# Patient Record
Sex: Female | Born: 1980 | Race: White | Hispanic: No | Smoking: Never smoker
Health system: Southern US, Community
[De-identification: ages and names within clinical notes are randomized; demographics above are authoritative.]

## PROBLEM LIST (undated history)

## (undated) ENCOUNTER — Inpatient Hospital Stay (HOSPITAL_COMMUNITY): Payer: Self-pay

## (undated) DIAGNOSIS — F32A Depression, unspecified: Secondary | ICD-10-CM

## (undated) DIAGNOSIS — R87629 Unspecified abnormal cytological findings in specimens from vagina: Secondary | ICD-10-CM

## (undated) DIAGNOSIS — Z87898 Personal history of other specified conditions: Secondary | ICD-10-CM

## (undated) DIAGNOSIS — O3481 Maternal care for other abnormalities of pelvic organs, first trimester: Secondary | ICD-10-CM

## (undated) DIAGNOSIS — F329 Major depressive disorder, single episode, unspecified: Secondary | ICD-10-CM

## (undated) DIAGNOSIS — N83209 Unspecified ovarian cyst, unspecified side: Secondary | ICD-10-CM

## (undated) DIAGNOSIS — F419 Anxiety disorder, unspecified: Secondary | ICD-10-CM

## (undated) DIAGNOSIS — O2693 Pregnancy related conditions, unspecified, third trimester: Secondary | ICD-10-CM

## (undated) HISTORY — DX: Pregnancy related conditions, unspecified, third trimester: O26.93

## (undated) HISTORY — DX: Maternal care for other abnormalities of pelvic organs, first trimester: O34.81

## (undated) HISTORY — DX: Unspecified ovarian cyst, unspecified side: N83.209

## (undated) HISTORY — DX: Personal history of other specified conditions: Z87.898

## (undated) HISTORY — DX: Depression, unspecified: F32.A

## (undated) HISTORY — PX: WISDOM TOOTH EXTRACTION: SHX21

## (undated) HISTORY — DX: Major depressive disorder, single episode, unspecified: F32.9

## (undated) HISTORY — DX: Anxiety disorder, unspecified: F41.9

## (undated) HISTORY — PX: OTHER SURGICAL HISTORY: SHX169

---

## 2008-10-30 DIAGNOSIS — O2693 Pregnancy related conditions, unspecified, third trimester: Secondary | ICD-10-CM

## 2008-10-30 HISTORY — DX: Pregnancy related conditions, unspecified, third trimester: O26.93

## 2010-12-02 DIAGNOSIS — F40218 Other animal type phobia: Secondary | ICD-10-CM | POA: Insufficient documentation

## 2011-03-14 LAB — RPR: RPR: NONREACTIVE

## 2011-03-14 LAB — ABO/RH: RH Type: POSITIVE

## 2011-04-26 ENCOUNTER — Ambulatory Visit (HOSPITAL_COMMUNITY): Payer: Self-pay | Admitting: Psychology

## 2011-05-02 ENCOUNTER — Ambulatory Visit (INDEPENDENT_AMBULATORY_CARE_PROVIDER_SITE_OTHER): Payer: 59 | Admitting: Psychology

## 2011-05-02 DIAGNOSIS — F331 Major depressive disorder, recurrent, moderate: Secondary | ICD-10-CM

## 2011-05-05 ENCOUNTER — Other Ambulatory Visit: Payer: Self-pay | Admitting: Obstetrics & Gynecology

## 2011-05-05 ENCOUNTER — Encounter (INDEPENDENT_AMBULATORY_CARE_PROVIDER_SITE_OTHER): Payer: BC Managed Care – PPO

## 2011-05-05 DIAGNOSIS — Z3689 Encounter for other specified antenatal screening: Secondary | ICD-10-CM

## 2011-05-05 DIAGNOSIS — Z3480 Encounter for supervision of other normal pregnancy, unspecified trimester: Secondary | ICD-10-CM

## 2011-05-08 ENCOUNTER — Encounter (INDEPENDENT_AMBULATORY_CARE_PROVIDER_SITE_OTHER): Payer: 59 | Admitting: Psychology

## 2011-05-08 ENCOUNTER — Ambulatory Visit (HOSPITAL_COMMUNITY)
Admission: RE | Admit: 2011-05-08 | Discharge: 2011-05-08 | Disposition: A | Discharge status: Home / Self Care | Payer: BC Managed Care – PPO | Source: Ambulatory Visit | Attending: Obstetrics & Gynecology | Admitting: Obstetrics & Gynecology

## 2011-05-08 DIAGNOSIS — O358XX0 Maternal care for other (suspected) fetal abnormality and damage, not applicable or unspecified: Secondary | ICD-10-CM | POA: Insufficient documentation

## 2011-05-08 DIAGNOSIS — F4323 Adjustment disorder with mixed anxiety and depressed mood: Secondary | ICD-10-CM

## 2011-05-08 DIAGNOSIS — Z1389 Encounter for screening for other disorder: Secondary | ICD-10-CM | POA: Insufficient documentation

## 2011-05-08 DIAGNOSIS — Z3689 Encounter for other specified antenatal screening: Secondary | ICD-10-CM

## 2011-05-08 DIAGNOSIS — Z363 Encounter for antenatal screening for malformations: Secondary | ICD-10-CM | POA: Insufficient documentation

## 2011-05-15 ENCOUNTER — Encounter (INDEPENDENT_AMBULATORY_CARE_PROVIDER_SITE_OTHER): Payer: 59 | Admitting: Psychology

## 2011-05-15 DIAGNOSIS — F411 Generalized anxiety disorder: Secondary | ICD-10-CM

## 2011-05-22 ENCOUNTER — Encounter (INDEPENDENT_AMBULATORY_CARE_PROVIDER_SITE_OTHER): Payer: 59 | Admitting: Psychology

## 2011-05-22 ENCOUNTER — Encounter: Payer: Self-pay | Admitting: *Deleted

## 2011-05-22 DIAGNOSIS — F411 Generalized anxiety disorder: Secondary | ICD-10-CM

## 2011-05-29 ENCOUNTER — Encounter (INDEPENDENT_AMBULATORY_CARE_PROVIDER_SITE_OTHER): Payer: BC Managed Care – PPO

## 2011-05-29 DIAGNOSIS — Z3480 Encounter for supervision of other normal pregnancy, unspecified trimester: Secondary | ICD-10-CM

## 2011-06-08 ENCOUNTER — Encounter (HOSPITAL_COMMUNITY): Payer: BC Managed Care – PPO | Admitting: Psychology

## 2011-06-19 ENCOUNTER — Encounter (HOSPITAL_COMMUNITY): Payer: BC Managed Care – PPO | Admitting: Psychology

## 2011-06-26 ENCOUNTER — Other Ambulatory Visit: Payer: Self-pay | Admitting: Physician Assistant

## 2011-06-26 ENCOUNTER — Other Ambulatory Visit: Payer: Self-pay | Admitting: Obstetrics & Gynecology

## 2011-06-26 ENCOUNTER — Encounter (INDEPENDENT_AMBULATORY_CARE_PROVIDER_SITE_OTHER): Payer: BC Managed Care – PPO | Admitting: Physician Assistant

## 2011-06-26 DIAGNOSIS — Z3480 Encounter for supervision of other normal pregnancy, unspecified trimester: Secondary | ICD-10-CM

## 2011-06-26 DIAGNOSIS — IMO0002 Reserved for concepts with insufficient information to code with codable children: Secondary | ICD-10-CM

## 2011-06-26 LAB — CBC
HCT: 34 % — AB (ref 36–46)
HCT: 34 % — AB (ref 36–46)
Hemoglobin: 10.6 g/dL — AB (ref 12.0–16.0)
Platelets: 170 K/µL (ref 150–399)

## 2011-06-26 LAB — HIV ANTIBODY (ROUTINE TESTING W REFLEX): HIV: NONREACTIVE

## 2011-06-26 LAB — SYPHILIS: RPR W/REFLEX TO RPR TITER AND TREPONEMAL ANTIBODIES, TRADITIONAL SCREENING AND DIAGNOSIS ALGORITHM: RPR: NONREACTIVE

## 2011-06-27 ENCOUNTER — Encounter: Payer: Self-pay | Admitting: *Deleted

## 2011-06-27 LAB — HIV ANTIBODY (ROUTINE TESTING W REFLEX): HIV: NONREACTIVE

## 2011-06-27 LAB — CBC
MCH: 28.9 pg (ref 26.0–34.0)
MCHC: 31.3 g/dL (ref 30.0–36.0)
Platelets: 170 10*3/uL (ref 150–400)
RBC: 3.67 MIL/uL — ABNORMAL LOW (ref 3.87–5.11)

## 2011-07-04 ENCOUNTER — Ambulatory Visit (HOSPITAL_COMMUNITY)
Admission: RE | Admit: 2011-07-04 | Discharge: 2011-07-04 | Disposition: A | Discharge status: Home / Self Care | Payer: BC Managed Care – PPO | Source: Ambulatory Visit | Attending: Obstetrics & Gynecology | Admitting: Obstetrics & Gynecology

## 2011-07-04 DIAGNOSIS — IMO0002 Reserved for concepts with insufficient information to code with codable children: Secondary | ICD-10-CM

## 2011-07-04 DIAGNOSIS — Z3689 Encounter for other specified antenatal screening: Secondary | ICD-10-CM | POA: Insufficient documentation

## 2011-07-10 ENCOUNTER — Encounter (HOSPITAL_COMMUNITY): Payer: BC Managed Care – PPO | Admitting: Psychology

## 2011-07-13 ENCOUNTER — Ambulatory Visit: Payer: BC Managed Care – PPO | Admitting: Physician Assistant

## 2011-07-31 ENCOUNTER — Encounter (INDEPENDENT_AMBULATORY_CARE_PROVIDER_SITE_OTHER): Payer: BC Managed Care – PPO | Admitting: Psychology

## 2011-07-31 DIAGNOSIS — F411 Generalized anxiety disorder: Secondary | ICD-10-CM

## 2011-08-04 ENCOUNTER — Encounter: Payer: Self-pay | Admitting: Family

## 2011-08-04 ENCOUNTER — Ambulatory Visit (INDEPENDENT_AMBULATORY_CARE_PROVIDER_SITE_OTHER): Payer: BC Managed Care – PPO | Admitting: Family

## 2011-08-04 VITALS — BP 96/60 | Temp 98.5°F | Wt 157.0 lb

## 2011-08-04 DIAGNOSIS — Z3480 Encounter for supervision of other normal pregnancy, unspecified trimester: Secondary | ICD-10-CM

## 2011-08-04 DIAGNOSIS — IMO0002 Reserved for concepts with insufficient information to code with codable children: Secondary | ICD-10-CM

## 2011-08-04 NOTE — Progress Notes (Signed)
p-89 

## 2011-08-04 NOTE — Progress Notes (Signed)
Reviewed philosophy of episiotomies (not unless medically indicated>fetal distress); no problems or concerns; schedule growth [place] due to marginal cord insertion.

## 2011-08-08 ENCOUNTER — Ambulatory Visit (HOSPITAL_COMMUNITY)
Admission: RE | Admit: 2011-08-08 | Discharge: 2011-08-08 | Disposition: A | Discharge status: Home / Self Care | Payer: BC Managed Care – PPO | Source: Ambulatory Visit | Attending: Obstetrics & Gynecology | Admitting: Obstetrics & Gynecology

## 2011-08-08 DIAGNOSIS — Z3689 Encounter for other specified antenatal screening: Secondary | ICD-10-CM | POA: Insufficient documentation

## 2011-08-08 DIAGNOSIS — IMO0002 Reserved for concepts with insufficient information to code with codable children: Secondary | ICD-10-CM

## 2011-08-14 DIAGNOSIS — O368390 Maternal care for abnormalities of the fetal heart rate or rhythm, unspecified trimester, not applicable or unspecified: Secondary | ICD-10-CM

## 2011-08-18 ENCOUNTER — Ambulatory Visit (INDEPENDENT_AMBULATORY_CARE_PROVIDER_SITE_OTHER): Payer: BC Managed Care – PPO | Admitting: Advanced Practice Midwife

## 2011-08-18 DIAGNOSIS — Z3480 Encounter for supervision of other normal pregnancy, unspecified trimester: Secondary | ICD-10-CM

## 2011-08-18 NOTE — Progress Notes (Signed)
p=96 

## 2011-08-18 NOTE — Patient Instructions (Signed)
Pregnancy - Third Trimester The third trimester of pregnancy (the last 3 months) is a period of the most rapid growth for you and your baby. The baby approaches a length of 20 inches and a weight of 6 to 10 pounds. The baby is adding on fat and getting ready for life outside your body. While inside, babies have periods of sleeping and waking, suck their thumbs, and hiccups. You can often feel small contractions of the uterus. This is false labor. It is also called Braxton-Hicks contractions. This is like a practice for labor. The usual problems in this stage of pregnancy include more difficulty breathing, swelling of the hands and feet from water retention, and having to urinate more often because of the uterus and baby pressing on your bladder.  PRENATAL EXAMS  Blood work may continue to be done during prenatal exams. These tests are done to check on your health and the probable health of your baby. Blood work is used to follow your blood levels (hemoglobin). Anemia (low hemoglobin) is common during pregnancy. Iron and vitamins are given to help prevent this. You may also continue to be checked for diabetes. Some of the past blood tests may be done again.   The size of the uterus is measured during each visit. This makes sure your baby is growing properly according to your pregnancy dates.   Your blood pressure is checked every prenatal visit. This is to make sure you are not getting toxemia.   Your urine is checked every prenatal visit for infection, diabetes and protein.   Your weight is checked at each visit. This is done to make sure gains are happening at the suggested rate and that you and your baby are growing normally.   Sometimes, an ultrasound is performed to confirm the position and the proper growth and development of the baby. This is a test done that bounces harmless sound waves off the baby so your caregiver can more accurately determine due dates.   Discuss the type of pain  medication and anesthesia you will have during your labor and delivery.   Discuss the possibility and anesthesia if a Cesarean Section might be necessary.   Inform your caregiver if there is any mental or physical violence at home.  Sometimes, a specialized non-stress test, contraction stress test and biophysical profile are done to make sure the baby is not having a problem. Checking the amniotic fluid surrounding the baby is called an amniocentesis. The amniotic fluid is removed by sticking a needle into the belly (abdomen). This is sometimes done near the end of pregnancy if an early delivery is required. In this case, it is done to help make sure the baby's lungs are mature enough for the baby to live outside of the womb. If the lungs are not mature and it is unsafe to deliver the baby, an injection of cortisone medication is given to the mother 1 to 2 days before the delivery. This helps the baby's lungs mature and makes it safer to deliver the baby. CHANGES OCCURING IN THE THIRD TRIMESTER OF PREGNANCY Your body goes through many changes during pregnancy. They vary from person to person. Talk to your caregiver about changes you notice and are concerned about.  During the last trimester, you have probably had an increase in your appetite. It is normal to have cravings for certain foods. This varies from person to person and pregnancy to pregnancy.   You may begin to get stretch marks on your hips,   abdomen, and breasts. These are normal changes in the body during pregnancy. There are no exercises or medications to take which prevent this change.   Constipation may be treated with a stool softener or adding bulk to your diet. Drinking lots of fluids, fiber in vegetables, fruits, and whole grains are helpful.   Exercising is also helpful. If you have been very active up until your pregnancy, most of these activities can be continued during your pregnancy. If you have been less active, it is helpful  to start an exercise program such as walking. Consult your caregiver before starting exercise programs.   Avoid all smoking, alcohol, un-prescribed drugs, herbs and "street drugs" during your pregnancy. These chemicals affect the formation and growth of the baby. Avoid chemicals throughout the pregnancy to ensure the delivery of a healthy infant.   Backache, varicose veins and hemorrhoids may develop or get worse.   You will tire more easily in the third trimester, which is normal.   The baby's movements may be stronger and more often.   You may become short of breath easily.   Your belly button may stick out.   A yellow discharge may leak from your breasts called colostrum.   You may have a bloody mucus discharge. This usually occurs a few days to a week before labor begins.  HOME CARE INSTRUCTIONS   Keep your caregiver's appointments. Follow your caregiver's instructions regarding medication use, exercise, and diet.   During pregnancy, you are providing food for you and your baby. Continue to eat regular, well-balanced meals. Choose foods such as meat, fish, milk and other low fat dairy products, vegetables, fruits, and whole-grain breads and cereals. Your caregiver will tell you of the ideal weight gain.   A physical sexual relationship may be continued throughout pregnancy if there are no other problems such as early (premature) leaking of amniotic fluid from the membranes, vaginal bleeding, or belly (abdominal) pain.   Exercise regularly if there are no restrictions. Check with your caregiver if you are unsure of the safety of your exercises. Greater weight gain will occur in the last 2 trimesters of pregnancy. Exercising helps:   Control your weight.   Get you in shape for labor and delivery.   You lose weight after you deliver.   Rest a lot with legs elevated, or as needed for leg cramps or low back pain.   Wear a good support or jogging bra for breast tenderness during  pregnancy. This may help if worn during sleep. Pads or tissues may be used in the bra if you are leaking colostrum.   Do not use hot tubs, steam rooms, or saunas.   Wear your seat belt when driving. This protects you and your baby if you are in an accident.   Avoid raw meat, cat litter boxes and soil used by cats. These carry germs that can cause birth defects in the baby.   It is easier to loose urine during pregnancy. Tightening up and strengthening the pelvic muscles will help with this problem. You can practice stopping your urination while you are going to the bathroom. These are the same muscles you need to strengthen. It is also the muscles you would use if you were trying to stop from passing gas. You can practice tightening these muscles up 10 times a set and repeating this about 3 times per day. Once you know what muscles to tighten up, do not perform these exercises during urination. It is more likely   to cause an infection by backing up the urine.   Ask for help if you have financial, counseling or nutritional needs during pregnancy. Your caregiver will be able to offer counseling for these needs as well as refer you for other special needs.   Make a list of emergency phone numbers and have them available.   Plan on getting help from family or friends when you go home from the hospital.   Make a trial run to the hospital.   Take prenatal classes with the father to understand, practice and ask questions about the labor and delivery.   Prepare the baby's room/nursery.   Do not travel out of the city unless it is absolutely necessary and with the advice of your caregiver.   Wear only low or no heal shoes to have better balance and prevent falling.  MEDICATIONS AND DRUG USE IN PREGNANCY  Take prenatal vitamins as directed. The vitamin should contain 1 milligram of folic acid. Keep all vitamins out of reach of children. Only a couple vitamins or tablets containing iron may be fatal  to a baby or young child when ingested.   Avoid use of all medications, including herbs, over-the-counter medications, not prescribed or suggested by your caregiver. Only take over-the-counter or prescription medicines for pain, discomfort, or fever as directed by your caregiver. Do not use aspirin, ibuprofen (Motrin, Advil, Nuprin) or naproxen (Aleve) unless OK'd by your caregiver.   Let your caregiver also know about herbs you may be using.   Alcohol is related to a number of birth defects. This includes fetal alcohol syndrome. All alcohol, in any form, should be avoided completely. Smoking will cause low birth rate and premature babies.   Street/illegal drugs are very harmful to the baby. They are absolutely forbidden. A baby born to an addicted mother will be addicted at birth. The baby will go through the same withdrawal an adult does.  SEEK MEDICAL CARE IF: You have any concerns or worries during your pregnancy. It is better to call with your questions if you feel they cannot wait, rather than worry about them. DECISIONS ABOUT CIRCUMCISION You may or may not know the sex of your baby. If you know your baby is a boy, it may be time to think about circumcision. Circumcision is the removal of the foreskin of the penis. This is the skin that covers the sensitive end of the penis. There is no proven medical need for this. Often this decision is made on what is popular at the time or based upon religious beliefs and social issues. You can discuss these issues with your caregiver or pediatrician. SEEK IMMEDIATE MEDICAL CARE IF:   An unexplained oral temperature above 102 F (38.9 C) develops, or as your caregiver suggests.   You have leaking of fluid from the vagina (birth canal). If leaking membranes are suspected, take your temperature and tell your caregiver of this when you call.   There is vaginal spotting, bleeding or passing clots. Tell your caregiver of the amount and how many pads are  used.   You develop a bad smelling vaginal discharge with a change in the color from clear to white.   You develop vomiting that lasts more than 24 hours.   You develop chills or fever.   You develop shortness of breath.   You develop burning on urination.   You loose more than 2 pounds of weight or gain more than 2 pounds of weight or as suggested by your   caregiver.   You notice sudden swelling of your face, hands, and feet or legs.   You develop belly (abdominal) pain. Round ligament discomfort is a common non-cancerous (benign) cause of abdominal pain in pregnancy. Your caregiver still must evaluate you.   You develop a severe headache that does not go away.   You develop visual problems, blurred or double vision.   If you have not felt your baby move for more than 1 hour. If you think the baby is not moving as much as usual, eat something with sugar in it and lie down on your left side for an hour. The baby should move at least 4 to 5 times per hour. Call right away if your baby moves less than that.   You fall, are in a car accident or any kind of trauma.   There is mental or physical violence at home.  Document Released: 10/10/2001 Document Revised: 06/28/2011 Document Reviewed: 04/14/2009 [employer] Patient Information 2012 [employer]. 

## 2011-08-18 NOTE — Progress Notes (Signed)
Cord insertion normal on 10/9 [place], growth and fluid WNL. Rev'd birth plan and questions about delivery in detail. Hoping for non-interventive VBAC. Pt asked about hospital policy for monitoring for VBAC - I was unsure during visit, per Dr. Penne Lash, continuous monitoring is recommended, please discuss with patient at next visit.

## 2011-08-21 ENCOUNTER — Encounter (INDEPENDENT_AMBULATORY_CARE_PROVIDER_SITE_OTHER): Payer: BC Managed Care – PPO | Admitting: Psychology

## 2011-08-21 DIAGNOSIS — F429 Obsessive-compulsive disorder, unspecified: Secondary | ICD-10-CM

## 2011-08-21 DIAGNOSIS — F331 Major depressive disorder, recurrent, moderate: Secondary | ICD-10-CM

## 2011-08-28 ENCOUNTER — Ambulatory Visit (INDEPENDENT_AMBULATORY_CARE_PROVIDER_SITE_OTHER): Payer: 59 | Admitting: Advanced Practice Midwife

## 2011-08-28 ENCOUNTER — Other Ambulatory Visit: Payer: Self-pay | Admitting: Advanced Practice Midwife

## 2011-08-28 ENCOUNTER — Encounter: Payer: Self-pay | Admitting: Advanced Practice Midwife

## 2011-08-28 VITALS — BP 106/74 | Temp 98.6°F | Wt 161.0 lb

## 2011-08-28 DIAGNOSIS — Z3490 Encounter for supervision of normal pregnancy, unspecified, unspecified trimester: Secondary | ICD-10-CM

## 2011-08-28 DIAGNOSIS — Z3480 Encounter for supervision of other normal pregnancy, unspecified trimester: Secondary | ICD-10-CM

## 2011-08-28 NOTE — Patient Instructions (Signed)
Pregnancy - Third Trimester The third trimester of pregnancy (the last 3 months) is a period of the most rapid growth for you and your baby. The baby approaches a length of 20 inches and a weight of 6 to 10 pounds. The baby is adding on fat and getting ready for life outside your body. While inside, babies have periods of sleeping and waking, suck their thumbs, and hiccups. You can often feel small contractions of the uterus. This is false labor. It is also called Braxton-Hicks contractions. This is like a practice for labor. The usual problems in this stage of pregnancy include more difficulty breathing, swelling of the hands and feet from water retention, and having to urinate more often because of the uterus and baby pressing on your bladder.  PRENATAL EXAMS  Blood work may continue to be done during prenatal exams. These tests are done to check on your health and the probable health of your baby. Blood work is used to follow your blood levels (hemoglobin). Anemia (low hemoglobin) is common during pregnancy. Iron and vitamins are given to help prevent this. You may also continue to be checked for diabetes. Some of the past blood tests may be done again.   The size of the uterus is measured during each visit. This makes sure your baby is growing properly according to your pregnancy dates.   Your blood pressure is checked every prenatal visit. This is to make sure you are not getting toxemia.   Your urine is checked every prenatal visit for infection, diabetes and protein.   Your weight is checked at each visit. This is done to make sure gains are happening at the suggested rate and that you and your baby are growing normally.   Sometimes, an ultrasound is performed to confirm the position and the proper growth and development of the baby. This is a test done that bounces harmless sound waves off the baby so your caregiver can more accurately determine due dates.   Discuss the type of pain  medication and anesthesia you will have during your labor and delivery.   Discuss the possibility and anesthesia if a Cesarean Section might be necessary.   Inform your caregiver if there is any mental or physical violence at home.  Sometimes, a specialized non-stress test, contraction stress test and biophysical profile are done to make sure the baby is not having a problem. Checking the amniotic fluid surrounding the baby is called an amniocentesis. The amniotic fluid is removed by sticking a needle into the belly (abdomen). This is sometimes done near the end of pregnancy if an early delivery is required. In this case, it is done to help make sure the baby's lungs are mature enough for the baby to live outside of the womb. If the lungs are not mature and it is unsafe to deliver the baby, an injection of cortisone medication is given to the mother 1 to 2 days before the delivery. This helps the baby's lungs mature and makes it safer to deliver the baby. CHANGES OCCURING IN THE THIRD TRIMESTER OF PREGNANCY Your body goes through many changes during pregnancy. They vary from person to person. Talk to your caregiver about changes you notice and are concerned about.  During the last trimester, you have probably had an increase in your appetite. It is normal to have cravings for certain foods. This varies from person to person and pregnancy to pregnancy.   You may begin to get stretch marks on your hips,   abdomen, and breasts. These are normal changes in the body during pregnancy. There are no exercises or medications to take which prevent this change.   Constipation may be treated with a stool softener or adding bulk to your diet. Drinking lots of fluids, fiber in vegetables, fruits, and whole grains are helpful.   Exercising is also helpful. If you have been very active up until your pregnancy, most of these activities can be continued during your pregnancy. If you have been less active, it is helpful  to start an exercise program such as walking. Consult your caregiver before starting exercise programs.   Avoid all smoking, alcohol, un-prescribed drugs, herbs and "street drugs" during your pregnancy. These chemicals affect the formation and growth of the baby. Avoid chemicals throughout the pregnancy to ensure the delivery of a healthy infant.   Backache, varicose veins and hemorrhoids may develop or get worse.   You will tire more easily in the third trimester, which is normal.   The baby's movements may be stronger and more often.   You may become short of breath easily.   Your belly button may stick out.   A yellow discharge may leak from your breasts called colostrum.   You may have a bloody mucus discharge. This usually occurs a few days to a week before labor begins.  HOME CARE INSTRUCTIONS   Keep your caregiver's appointments. Follow your caregiver's instructions regarding medication use, exercise, and diet.   During pregnancy, you are providing food for you and your baby. Continue to eat regular, well-balanced meals. Choose foods such as meat, fish, milk and other low fat dairy products, vegetables, fruits, and whole-grain breads and cereals. Your caregiver will tell you of the ideal weight gain.   A physical sexual relationship may be continued throughout pregnancy if there are no other problems such as early (premature) leaking of amniotic fluid from the membranes, vaginal bleeding, or belly (abdominal) pain.   Exercise regularly if there are no restrictions. Check with your caregiver if you are unsure of the safety of your exercises. Greater weight gain will occur in the last 2 trimesters of pregnancy. Exercising helps:   Control your weight.   Get you in shape for labor and delivery.   You lose weight after you deliver.   Rest a lot with legs elevated, or as needed for leg cramps or low back pain.   Wear a good support or jogging bra for breast tenderness during  pregnancy. This may help if worn during sleep. Pads or tissues may be used in the bra if you are leaking colostrum.   Do not use hot tubs, steam rooms, or saunas.   Wear your seat belt when driving. This protects you and your baby if you are in an accident.   Avoid raw meat, cat litter boxes and soil used by cats. These carry germs that can cause birth defects in the baby.   It is easier to loose urine during pregnancy. Tightening up and strengthening the pelvic muscles will help with this problem. You can practice stopping your urination while you are going to the bathroom. These are the same muscles you need to strengthen. It is also the muscles you would use if you were trying to stop from passing gas. You can practice tightening these muscles up 10 times a set and repeating this about 3 times per day. Once you know what muscles to tighten up, do not perform these exercises during urination. It is more likely   to cause an infection by backing up the urine.   Ask for help if you have financial, counseling or nutritional needs during pregnancy. Your caregiver will be able to offer counseling for these needs as well as refer you for other special needs.   Make a list of emergency phone numbers and have them available.   Plan on getting help from family or friends when you go home from the hospital.   Make a trial run to the hospital.   Take prenatal classes with the father to understand, practice and ask questions about the labor and delivery.   Prepare the baby's room/nursery.   Do not travel out of the city unless it is absolutely necessary and with the advice of your caregiver.   Wear only low or no heal shoes to have better balance and prevent falling.  MEDICATIONS AND DRUG USE IN PREGNANCY  Take prenatal vitamins as directed. The vitamin should contain 1 milligram of folic acid. Keep all vitamins out of reach of children. Only a couple vitamins or tablets containing iron may be fatal  to a baby or young child when ingested.   Avoid use of all medications, including herbs, over-the-counter medications, not prescribed or suggested by your caregiver. Only take over-the-counter or prescription medicines for pain, discomfort, or fever as directed by your caregiver. Do not use aspirin, ibuprofen (Motrin, Advil, Nuprin) or naproxen (Aleve) unless OK'd by your caregiver.   Let your caregiver also know about herbs you may be using.   Alcohol is related to a number of birth defects. This includes fetal alcohol syndrome. All alcohol, in any form, should be avoided completely. Smoking will cause low birth rate and premature babies.   Street/illegal drugs are very harmful to the baby. They are absolutely forbidden. A baby born to an addicted mother will be addicted at birth. The baby will go through the same withdrawal an adult does.  SEEK MEDICAL CARE IF: You have any concerns or worries during your pregnancy. It is better to call with your questions if you feel they cannot wait, rather than worry about them. DECISIONS ABOUT CIRCUMCISION You may or may not know the sex of your baby. If you know your baby is a boy, it may be time to think about circumcision. Circumcision is the removal of the foreskin of the penis. This is the skin that covers the sensitive end of the penis. There is no proven medical need for this. Often this decision is made on what is popular at the time or based upon religious beliefs and social issues. You can discuss these issues with your caregiver or pediatrician. SEEK IMMEDIATE MEDICAL CARE IF:   An unexplained oral temperature above 102 F (38.9 C) develops, or as your caregiver suggests.   You have leaking of fluid from the vagina (birth canal). If leaking membranes are suspected, take your temperature and tell your caregiver of this when you call.   There is vaginal spotting, bleeding or passing clots. Tell your caregiver of the amount and how many pads are  used.   You develop a bad smelling vaginal discharge with a change in the color from clear to white.   You develop vomiting that lasts more than 24 hours.   You develop chills or fever.   You develop shortness of breath.   You develop burning on urination.   You loose more than 2 pounds of weight or gain more than 2 pounds of weight or as suggested by your   caregiver.   You notice sudden swelling of your face, hands, and feet or legs.   You develop belly (abdominal) pain. Round ligament discomfort is a common non-cancerous (benign) cause of abdominal pain in pregnancy. Your caregiver still must evaluate you.   You develop a severe headache that does not go away.   You develop visual problems, blurred or double vision.   If you have not felt your baby move for more than 1 hour. If you think the baby is not moving as much as usual, eat something with sugar in it and lie down on your left side for an hour. The baby should move at least 4 to 5 times per hour. Call right away if your baby moves less than that.   You fall, are in a car accident or any kind of trauma.   There is mental or physical violence at home.  Document Released: 10/10/2001 Document Revised: 06/28/2011 Document Reviewed: 04/14/2009 [employer] Patient Information 2012 [employer]. 

## 2011-08-28 NOTE — Progress Notes (Signed)
Well, irreg UCs, + fetal movement. GBS done.

## 2011-08-28 NOTE — Progress Notes (Signed)
p-96 36 wk cultures

## 2011-09-01 ENCOUNTER — Encounter (HOSPITAL_COMMUNITY): Payer: Self-pay

## 2011-09-04 ENCOUNTER — Ambulatory Visit (INDEPENDENT_AMBULATORY_CARE_PROVIDER_SITE_OTHER): Payer: 59 | Admitting: Psychiatry

## 2011-09-04 VITALS — BP 98/59 | HR 72 | Ht 66.0 in | Wt 163.0 lb

## 2011-09-04 DIAGNOSIS — F329 Major depressive disorder, single episode, unspecified: Secondary | ICD-10-CM

## 2011-09-04 DIAGNOSIS — F40298 Other specified phobia: Secondary | ICD-10-CM

## 2011-09-04 DIAGNOSIS — F40218 Other animal type phobia: Secondary | ICD-10-CM

## 2011-09-04 DIAGNOSIS — F411 Generalized anxiety disorder: Secondary | ICD-10-CM

## 2011-09-04 DIAGNOSIS — F419 Anxiety disorder, unspecified: Secondary | ICD-10-CM

## 2011-09-05 ENCOUNTER — Encounter (HOSPITAL_COMMUNITY): Payer: Self-pay | Admitting: Psychiatry

## 2011-09-05 NOTE — Progress Notes (Signed)
Vanessa Larson is a 30 y.o. pregnant CF, [redacted] wks gestation who is being seen by staff at [facility]. She is also in therapy with KL at [facility] and is referred to writer for exacerbation of depression and anxiety.  She reports major changes in life since marriage.  She had graduated from college with a BS degree and began teaching kindergarten and first grade.  After marriage she gave birth to twin sons who are 2 1/2 yrs. Old.  She was pregnant when her husband was transferred from [place]  to [place]  She had a severe reaction to the discovery that movers used flea infested blankets for their move.  She found six fleas and had a panic attack.  She called in exterminators .  She is not hypervigilant about finding any fleas or any other insects. [place] used to seeing bugs. She says additional stress is in her life because her husband was raised in a very religious family who believes you overcome depressed mood by 'being right with God'.  He discounts her depression and has not been understanding about her emotional state.  He is very quiet and resevred while she is very verbal and emotional.  She experienced her first depression while in college.  She took Lexapro, an SSRI,  that was helpful.  She stopped taking it  Off and on and finally quit.  She made one suicide attempt in college.  She wrapped a wire hanger around her neck and hung it on the door.  She denies suicidal ideation today.  She has tried to reconcile what she thinks is an over-reaction to the fleas.    She reports that she experienced a traumatic loss of her father was killed three years ago.   He ran outside while car was being stolen.  The thief ran over him with the car and he died.  She said this was so sudden that she tried to control everything in her life.  It has caused her to be super organized about providing 'in her absence'   that if she were not there, her husband Vanessa Larson would have all the  information he needed about her children.  She has a systematic way to informing about all facets of their life; were she not there to take care of it.   That is why she is so apprehensive about something 'invading her life'.  ....Marland KitchenShe is out of control . POSSIBLE ANTIDEPRESSANTS ARE DISCUSSED WITH CONSIDERATION OF RISKS BENEFITS AND ALTERNATIVE CHOICES.  SHE IS AWARE OF THE CONSIDERATIONS has researched choices.   She says she would rather take a medication now rather and run the risk of postpartum depression occuring; She is also sensitive to the consideration of what medication gets into breast milk since she plans to breast feed.  She is told a medication will be prescribed only with the advice of the obstetrician.  Dr. Nicholaus Bloom MD is contacted at [facility] [address]   [phone]    Mental Status Evaluation: Appearance:  age appropriate, casually dressed and pregnant  Behavior:  normal and mildly anxious  Speech:  normal pitch and normal volume  Mood:  angry, anxious and depressed  Affect:  mood-congruent  Thought Process:  normal  Thought Content:  preoccupation with insect phobia and loss of control  Sensorium:  person, place, time/date and situation  Cognition:  grossly intact  Insight:  impaired due to sudden death/loss of father  Judgment:  age appropriate     Assessment - Diagnosis - Goals:   Axis I: Major Depression, 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 Medication risks/benefit reviewed with the patient Plan to discuss medication choices with Obstetrician, Dr. Elsie Ra, MD  Kayann Maj

## 2011-09-06 ENCOUNTER — Encounter (HOSPITAL_COMMUNITY): Payer: Self-pay | Admitting: Psychiatry

## 2011-09-06 NOTE — Patient Instructions (Signed)
Pt is instructed to return after writer consults with her obstetrician regarding choice of medication.

## 2011-09-08 ENCOUNTER — Telehealth (HOSPITAL_COMMUNITY): Payer: Self-pay | Admitting: Psychiatry

## 2011-09-08 ENCOUNTER — Ambulatory Visit: Payer: 59 | Admitting: Physician Assistant

## 2011-09-08 VITALS — BP 111/65 | Temp 98.4°F | Wt 164.0 lb

## 2011-09-08 DIAGNOSIS — F419 Anxiety disorder, unspecified: Secondary | ICD-10-CM

## 2011-09-08 DIAGNOSIS — Z3480 Encounter for supervision of other normal pregnancy, unspecified trimester: Secondary | ICD-10-CM

## 2011-09-08 DIAGNOSIS — F329 Major depressive disorder, single episode, unspecified: Secondary | ICD-10-CM

## 2011-09-08 MED ORDER — ESCITALOPRAM OXALATE 10 MG PO TABS: 10.0000 mg | ORAL_TABLET | Freq: Every day | ORAL | Status: DC

## 2011-09-08 NOTE — Telephone Encounter (Signed)
Told pt that caller had talked with her obstetrician Dr. Elsie Ra.

## 2011-09-08 NOTE — Telephone Encounter (Signed)
Pt has not called back  She is called again and asked to return the call  LM

## 2011-09-08 NOTE — Progress Notes (Signed)
p-83 

## 2011-09-08 NOTE — Progress Notes (Signed)
No complaints. Neg GBS and cultures reviewed. Labor precautions reviewed. FU 1. Declines flu shot. Discussed with pt counselor's rec of medications for anxiety and depression. Pt uncertain of desire to start meds. Reviewed time for meds to become effective. Will send Rx to pharmacy, pt undecided whether she is going to start.

## 2011-09-08 NOTE — Patient Instructions (Addendum)
Place 32-42 weeks prenatal visit patient instructions here. Vaginal Birth After Cesarean Delivery Vaginal birth after Cesarean delivery (VBAC) is giving birth vaginally after previously delivering a baby by a cesarean. In the past, if a woman had a Cesarean delivery, all births afterwards would be done by Cesarean delivery. This is no longer true. It can be safe for the mother to try a vaginal delivery after having a Cesarean. The final decision to have a VBAC or repeat Cesarean delivery should be between the patient and her caregiver. The risks and benefits can be discussed relative to the reason for, and the type of the previous Cesarean delivery. WOMEN WHO PLAN TO HAVE A VBAC SHOULD CHECK WITH THEIR DOCTOR TO BE SURE THAT:  The previous Cesarean was done with a low transverse uterine incision (not a vertical classical incision).   The birth canal is big enough for the baby.   There were no other operations on the uterus.   They will have an electronic fetal monitor (EFM) on at all times during labor.   An operating room would be available and ready in case an emergency Cesarean is needed.   A doctor and surgical nursing staff would be available at all times during labor to be ready to do an emergency Cesarean if necessary.   An anesthesiologist would be present in case an emergency Cesarean is needed.   The nursery is prepared and has adequate personnel and necessary equipment available to care for the baby in case of an emergency Cesarean.  BENEFITS OF VBAC:  Shorter stay in the hospital.   Lower delivery, nursery and hospital costs.   Less blood loss and need for blood transfusions.   Less fever and discomfort from major surgery.   Lower risk of blood clots.   Lower risk of infection.   Shorter recovery after going home.   Lower risk of other surgical complications, such as opening of the incision or hernia in the incision.   Decreased risk of injury to other organs.    Decreased risk for having to remove the uterus (hysterectomy).   Decreased risk for the placenta to completely or partially cover the opening of the uterus (placenta previa) with a future pregnancy.   Ability to have a larger family if desired.  RISKS OF A VBAC:  Rupture of the uterus.   Having to remove the uterus (hysterectomy) if it ruptures.   All the complications of major surgery and/or injury to other organs.   Excessive bleeding, blood clots and infection.   Lower Apgar scores (method to evaluate the newborn based on appearance, pulse, grimace, activity, and respiration) and more risks to the baby.   There is a higher risk of uterine rupture if you induce or augment labor.   There is a higher risk of uterine rupture if you use medications to ripen the cervix.  VBAC SHOULD NOT BE DONE IF:  The previous Cesarean was done with a vertical (classical) or T-shaped incision, or you do not know what kind of an incision was made.   You had a ruptured uterus.   You had surgery on your uterus.   You have medical or obstetrical problems.   There are problems with the baby.   There were two previous Cesarean deliveries and no vaginal deliveries.  OTHER FACTS TO KNOW ABOUT VBAC:  It is safe to have an epidural anesthetic with VBAC.   It is safe to turn the baby from a breech position (attempt an  external cephalic version).   It is safe to try a VBAC with twins.   Pregnancies later than 40 weeks have not been successful with VBAC.   There is an increased failure rate of a VBAC in obese pregnant women.   There is an increased failure rate with VABC if the baby weighs 8.8 pounds (4000 grams) or more.   There is an increased failure rate if the time between the Cesarean and VBAC is less than 19 months.   There is an increased failure rate if pre-eclampsia is present (high blood pressure, protein in the urine and swelling of face and extremities).   VBAC is very successful  if there was a previous vaginal birth.   VBAC is very successful when the labor starts spontaneously before the due date.   Delivery of VBAC is similar to having a normal spontaneous vaginal delivery.  It is important to discuss VBAC with your caregiver early in the pregnancy so you can understand the risks, benefits and options. It will give you time to decide what is best in your particular case relevant to the reason for your previous Cesarean delivery. It should be understood that medical changes in the mother or pregnancy may occur during the pregnancy, which make it necessary to change you or your caregiver's initial decision. The counseling, concerns and decisions should be documented in the medical record and signed by all parties. Document Released: 04/08/2007 Document Revised: 06/28/2011 Document Reviewed: 11/27/2008 [facility] Patient Information 2012 [place], [place].Natural Childbirth Natural childbirth is going through labor and delivery without any drugs to relieve pain. You also do not use fetal monitors, have a cesarean delivery, or get a sugical cut to enlarge the vaginal opening (episiotomy). With the help of a birthing professional (midwife), you will direct your own labor and delivery as you choose. Many women chose natural childbirth because they feel more in control and in touch with their labor and delivery. They are also concerned about the medications affecting themselves and the baby. Pregnant women with a high risk pregnancy should not attempt natural childbirth. It is better to deliver the infant in a hospital if an emergency situation arises. Sometimes, the caregiver has to intervene for the health and safety of the mother and infant. TWO TECHNIQUES FOR NATURAL CHILDBIRTH:   The Lamaze method. This method teaches women that having a baby is normal, healthy, and natural. It also teaches the mother to take a neutral position regarding pain medication and anesthesia and to make  an informed decision if and when it is right for them.   The Erven Colla (also called husband coached birth). This method teaches the father to be the birth coach and stresses a natural approach. It also encourages exercise and a balanced diet with good nutrition. The exercises teach relaxation and deep breathing techniques. However, there are also classes to prepare the parents for an emergency situation that may occur.  METHODS OF DEALING WITH LABOR PAIN AND DELIVERY:  Meditation.   Yoga.   Hypnosis.   Acupuncture.   Massage.   Changing positions (walking, rocking, showering, leaning on birth balls).   Lying in warm water or a jacuzzi.   Find an activity that keeps your mind off of the labor pain.   Listen to soft music.   Visual imagery (focus on a particular object).  BEFORE GOING INTO LABOR  Be sure you and your spouse/partner are in agreement to have natural childbirth.   Decide if your caregiver or a  midwife will deliver your baby.   Decide if you will have your baby in the hospital, birthing center, or at home.   If you have children, make plans to have someone to take care of them when you go to the hospital.   Know the distance and the time it takes to go to the delivery center. Make a dry run to be sure.   Have a bag packed with a night gown, bathrobe, and toiletries ready to take when you go into labor.   Keep phone numbers of your family and friends handy if you need to call someone when you go into labor.   Your spouse or partner should go to all the teaching classes.   Talk with your caregiver about the possibility of a medical emergency and what will happen if that occurs.  ADVANTAGES OF NATURAL CHILDBIRTH  You are in control of your labor and delivery.   It is safe.   There are no medications or anesthetics that may affect you and the fetus.   There are no invasive procedures such as an episiotomy.   You and your partner will work together,  which can increase your bond.   Meditation, yoga, massage, and breathing exercises can be learned while pregnant and help you when you are in labor and at delivery.   In most delivery centers, the family and friends can be involved in the labor and delivery process.  DISADVANTAGES OF NATURAL CHILDBIRTH  You will experience pain during your labor and delivery.   The methods of helping relieve your labor pains may not work for you.   You may feel embarrassed, disappointed, and like a failure if you decide to change your mind during labor and not have natural childbirth.  AFTER THE DELIVERY  You will be very tired.   You will be uncomfortable because of your uterus contracting. You will feel soreness around the vagina.   You may feel cold and shaky.This is a natural reaction.   You will be excited, overwhelmed, accomplished, and proud to be a mother.  HOME CARE INSTRUCTIONS   Follow the advice and instructions of your caregiver.   Follow the instructions of your natural childbirth instructor (Lamaze or Bradley Method).  Document Released: 09/28/2008 Document Revised: 06/28/2011 Document Reviewed: 09/28/2008 [facility] Patient Information 2012 [place], [place].

## 2011-09-11 ENCOUNTER — Telehealth (HOSPITAL_COMMUNITY): Payer: Self-pay | Admitting: Psychiatry

## 2011-09-11 ENCOUNTER — Encounter (HOSPITAL_COMMUNITY): Payer: Self-pay | Admitting: Psychiatry

## 2011-09-11 ENCOUNTER — Encounter (HOSPITAL_COMMUNITY): Payer: Self-pay | Admitting: Psychology

## 2011-09-11 ENCOUNTER — Ambulatory Visit (INDEPENDENT_AMBULATORY_CARE_PROVIDER_SITE_OTHER): Payer: BC Managed Care – PPO | Admitting: Psychology

## 2011-09-11 DIAGNOSIS — F3289 Other specified depressive episodes: Secondary | ICD-10-CM

## 2011-09-11 DIAGNOSIS — F418 Other specified anxiety disorders: Secondary | ICD-10-CM

## 2011-09-11 DIAGNOSIS — IMO0001 Reserved for inherently not codable concepts without codable children: Secondary | ICD-10-CM

## 2011-09-11 DIAGNOSIS — F32A Depression, unspecified: Secondary | ICD-10-CM

## 2011-09-11 DIAGNOSIS — F329 Major depressive disorder, single episode, unspecified: Secondary | ICD-10-CM

## 2011-09-11 DIAGNOSIS — F605 Obsessive-compulsive personality disorder: Secondary | ICD-10-CM

## 2011-09-11 MED ORDER — FLUOXETINE HCL 20 MG PO CAPS: 20.0000 mg | ORAL_CAPSULE | Freq: Every day | ORAL | Status: DC

## 2011-09-11 NOTE — Telephone Encounter (Signed)
Saw pt. Following therapy session with a counselor

## 2011-09-11 NOTE — Telephone Encounter (Signed)
LM Pt has not returned call

## 2011-09-11 NOTE — Telephone Encounter (Signed)
Duplicate encounter see 11/9;12 encounter

## 2011-09-11 NOTE — Progress Notes (Signed)
   THERAPIST PROGRESS NOTE  Session Time: 4098-1191 am  Participation Level: Active  Behavioral Response: Well GroomedAlertAnxious  Type of Therapy: Individual Therapy  Treatment Goals addressed: Communication and Coping  Interventions: CBT, Solution Focused and Strength-based  Summary: Vanessa Larson is a 30 y.o. female who presents with symptoms of depression and anxiety.   She continues to have marital issues with her hsuband and reports that she struggles to interact with him.  She notices that she can be having a good day but when it is late afternoon she starts to experience anxiety since she know he will soon return home.  I talked with her about the importance of making changes for herself and not for him and that as she became more content and happier with herself that this would likely help their relationship.  I also reminded her that she cannot make her husband change and she needed to stop trying.  The client acknowledges that her over-planning causes stress on their marriage.  She knows that he must be tired after working all day but feels he needs to interact with her when he is home.  I reminded her that she cannot make him interact but she could work on becoming more engagable with him and not continuing to accentuate daily what is not working between them.  He has actually commented that she always has to have something wrong and something to complain about.  I suggested she purposefully engage enjoyable activities in the afternoon so she can be more positively focused when he gets home from work.  Suicidal/Homicidal: No  Plan: Return again in 2 weeks.  Diagnosis: Axis I: Generalized Anxiety Disorder    Axis II: Obsessive- Compulsive Personality Traits    [name], [facility] 09/11/2011

## 2011-09-11 NOTE — Telephone Encounter (Signed)
Discussed benefit Risk and Alt. Treatment  And pt consents to take Prozac fluoxetine  Rx ordered with 2 refills.   Due to [facility]

## 2011-09-11 NOTE — Telephone Encounter (Signed)
Verifying encounter

## 2011-09-12 ENCOUNTER — Telehealth (HOSPITAL_COMMUNITY): Payer: Self-pay

## 2011-09-12 DIAGNOSIS — F429 Obsessive-compulsive disorder, unspecified: Secondary | ICD-10-CM

## 2011-09-12 DIAGNOSIS — F329 Major depressive disorder, single episode, unspecified: Secondary | ICD-10-CM

## 2011-09-12 MED ORDER — SERTRALINE HCL 25 MG PO TABS: 25.0000 mg | ORAL_TABLET | Freq: Every day | ORAL | Status: DC

## 2011-09-12 NOTE — Telephone Encounter (Signed)
Dee Rx documentation and routing note to Dr. Marice Potter.

## 2011-09-13 ENCOUNTER — Encounter (HOSPITAL_COMMUNITY): Payer: Self-pay | Admitting: Psychiatry

## 2011-09-15 ENCOUNTER — Ambulatory Visit (INDEPENDENT_AMBULATORY_CARE_PROVIDER_SITE_OTHER): Payer: 59 | Admitting: Family

## 2011-09-15 DIAGNOSIS — Z3480 Encounter for supervision of other normal pregnancy, unspecified trimester: Secondary | ICD-10-CM

## 2011-09-15 NOTE — Progress Notes (Signed)
p-[id number]  Pt has opted not to start Zoloft until after delivery.

## 2011-09-15 NOTE — Progress Notes (Signed)
No questions or concerns; denies vaginal bleeding or leaking of fluid.

## 2011-09-24 ENCOUNTER — Inpatient Hospital Stay (HOSPITAL_COMMUNITY)
Admission: AD | Admit: 2011-09-24 | Discharge: 2011-09-24 | Disposition: A | Discharge status: Home / Self Care | Payer: BC Managed Care – PPO | Source: Ambulatory Visit | Attending: Obstetrics & Gynecology | Admitting: Obstetrics & Gynecology

## 2011-09-24 ENCOUNTER — Encounter (HOSPITAL_COMMUNITY): Payer: Self-pay | Admitting: Obstetrics and Gynecology

## 2011-09-24 DIAGNOSIS — O368190 Decreased fetal movements, unspecified trimester, not applicable or unspecified: Secondary | ICD-10-CM | POA: Insufficient documentation

## 2011-09-24 DIAGNOSIS — IMO0002 Reserved for concepts with insufficient information to code with codable children: Secondary | ICD-10-CM

## 2011-09-24 DIAGNOSIS — O479 False labor, unspecified: Secondary | ICD-10-CM

## 2011-09-24 LAB — AMNISURE RUPTURE OF MEMBRANE (ROM) NOT AT ARMC: Amnisure ROM: NEGATIVE

## 2011-09-24 NOTE — Progress Notes (Signed)
Pt reports not feeling baby since yesterday afternoon. Reports clear fluid leaking and mild/moderate contraction on and off q 7-10 min.

## 2011-09-24 NOTE — Progress Notes (Signed)
[facility] notified of patient arrival with complaints of leakage of fluid, decreased fetal movement. Unable to see fern from swab of vagina. Amni sure ordered and orders received for RN to check cervix. Will notify CNM of results.

## 2011-09-24 NOTE — ED Provider Notes (Signed)
History   G2P1002 @ 40 1 admitted with questionable leaking membranes since Friday. Fern neg, amnisure pending.  Chief Complaint  Patient presents with  . Decreased Fetal Movement   HPI  OB History    Grav Para Term Preterm Abortions TAB SAB Ect Mult Living   2 1 1      1 2       Past Medical History  Diagnosis Date  . Anxiety   . Depression   . Pregnancy related condition in third trimester     [redacted] weeks gestation complicated by depression/anxiety    No past surgical history on file.  Family History  Problem Relation Age of Onset  . Depression Mother     History  Substance Use Topics  . Smoking status: Never Smoker   . Smokeless tobacco: Never Used  . Alcohol Use: No    Allergies: No Known Allergies  Prescriptions prior to admission  Medication Sig Dispense Refill  . acetaminophen (TYLENOL) 325 MG tablet Take 325 mg by mouth daily as needed. For pain       . camphor-phenol (CAMPHO-PHENIQUE) 10.8-4.7 % LIQD Apply 1 application topically 2 (two) times daily as needed.        . prenatal vitamin w/FE, FA (PRENATAL 1 + 1) 27-1 MG TABS Take 1 tablet by mouth daily.        . sertraline (ZOLOFT) 25 MG tablet Take 25 mg by mouth daily. Pt has not started yet, although she got it filled. She will start taking after the baby is born.       [name] DISCONTD: sertraline (ZOLOFT) 25 MG tablet Take 1 tablet (25 mg total) by mouth daily.  30 tablet  0    Review of Systems  Constitutional: Negative.   HENT: Negative.   Eyes: Negative.   Respiratory: Negative.   Cardiovascular: Negative.   Gastrointestinal: Negative.   Genitourinary: Negative.   Musculoskeletal: Negative.   Skin: Negative.   Neurological: Negative.   Endo/Heme/Allergies: Negative.   Psychiatric/Behavioral: Negative.    Physical Exam   Blood pressure 123/71, pulse 107, temperature 98.8 F (37.1 C), temperature source Oral, resp. rate 18, height 5\' 6"  (1.676 m), weight 75.932 kg (167 lb 6.4 oz), last menstrual  period 12/02/2010.  Physical Exam  Constitutional: She is oriented to person, place, and time. She appears well-developed and well-nourished.  HENT:  Head: Normocephalic.  Neck: Normal range of motion.  Cardiovascular: Normal rate, regular rhythm, normal heart sounds and intact distal pulses.   Respiratory: Effort normal.  GI: Soft. Bowel sounds are normal.  Genitourinary: Vagina normal and uterus normal.  Musculoskeletal: Normal range of motion.  Neurological: She is alert and oriented to person, place, and time. She has normal reflexes.  Skin: Skin is warm and dry.  Psychiatric: She has a normal mood and affect. Her behavior is normal. Judgment and thought content normal.    MAU Course  Procedures  MDM    Assessment and Plan  Stable maternal-fetal unit. If amnisure neg d/c home if pos admit and start pitocin induction of labor. SVE 1/70/-2. No leaking with exam, but mucous discharge noted.  Zerita Boers 09/24/2011, 10:21 AM

## 2011-09-25 ENCOUNTER — Inpatient Hospital Stay (HOSPITAL_COMMUNITY)
Admission: AD | Admit: 2011-09-25 | Discharge: 2011-09-27 | DRG: 373 | Disposition: A | Discharge status: Home / Self Care | Payer: BC Managed Care – PPO | Source: Ambulatory Visit | Attending: Obstetrics & Gynecology | Admitting: Obstetrics & Gynecology

## 2011-09-25 ENCOUNTER — Encounter (HOSPITAL_COMMUNITY): Payer: Self-pay | Admitting: *Deleted

## 2011-09-25 ENCOUNTER — Ambulatory Visit (INDEPENDENT_AMBULATORY_CARE_PROVIDER_SITE_OTHER): Payer: 59 | Admitting: Advanced Practice Midwife

## 2011-09-25 VITALS — BP 109/73 | Temp 98.5°F | Wt 166.0 lb

## 2011-09-25 DIAGNOSIS — Z3480 Encounter for supervision of other normal pregnancy, unspecified trimester: Secondary | ICD-10-CM

## 2011-09-25 DIAGNOSIS — O479 False labor, unspecified: Secondary | ICD-10-CM

## 2011-09-25 DIAGNOSIS — O365990 Maternal care for other known or suspected poor fetal growth, unspecified trimester, not applicable or unspecified: Principal | ICD-10-CM | POA: Diagnosis present

## 2011-09-25 DIAGNOSIS — IMO0002 Reserved for concepts with insufficient information to code with codable children: Secondary | ICD-10-CM

## 2011-09-25 DIAGNOSIS — O34219 Maternal care for unspecified type scar from previous cesarean delivery: Secondary | ICD-10-CM | POA: Insufficient documentation

## 2011-09-25 DIAGNOSIS — O3421 Maternal care for scar from previous cesarean delivery: Secondary | ICD-10-CM

## 2011-09-25 LAB — RPR: RPR Ser Ql: NONREACTIVE

## 2011-09-25 LAB — CBC
Platelets: 171 10*3/uL (ref 150–400)
RBC: 3.86 MIL/uL — ABNORMAL LOW (ref 3.87–5.11)
RDW: 14.3 % (ref 11.5–15.5)
WBC: 11.3 10*3/uL — ABNORMAL HIGH (ref 4.0–10.5)

## 2011-09-25 LAB — ABO/RH

## 2011-09-25 LAB — HIV ANTIBODY (ROUTINE TESTING W REFLEX): HIV: NONREACTIVE

## 2011-09-25 LAB — STREP B DNA PROBE: GBS: NEGATIVE

## 2011-09-25 MED ORDER — OXYTOCIN 20 UNITS IN LACTATED RINGERS INFUSION - SIMPLE
125.0000 mL/h | Freq: Once | INTRAVENOUS | Status: AC
  Administered 2011-09-26: 125 mL/h via INTRAVENOUS
  Filled 2011-09-25: qty 1000

## 2011-09-25 MED ORDER — FLEET ENEMA 7-19 GM/118ML RE ENEM: 1.0000 | ENEMA | RECTAL | Status: DC | PRN

## 2011-09-25 MED ORDER — LIDOCAINE HCL (PF) 1 % IJ SOLN
30.0000 mL | INTRAMUSCULAR | Status: DC | PRN
  Administered 2011-09-26: 30 mL via SUBCUTANEOUS
  Filled 2011-09-25: qty 30

## 2011-09-25 MED ORDER — LACTATED RINGERS IV SOLN: INTRAVENOUS | Status: DC

## 2011-09-25 MED ORDER — ACETAMINOPHEN 325 MG PO TABS: 650.0000 mg | ORAL_TABLET | ORAL | Status: DC | PRN

## 2011-09-25 MED ORDER — OXYCODONE-ACETAMINOPHEN 5-325 MG PO TABS: 2.0000 | ORAL_TABLET | ORAL | Status: DC | PRN

## 2011-09-25 MED ORDER — SODIUM CHLORIDE 0.9 % IJ SOLN: 3.0000 mL | Freq: Two times a day (BID) | INTRAMUSCULAR | Status: DC

## 2011-09-25 MED ORDER — OXYTOCIN BOLUS FROM INFUSION
500.0000 mL | Freq: Once | INTRAVENOUS | Status: DC
  Filled 2011-09-25: qty 500

## 2011-09-25 MED ORDER — CITRIC ACID-SODIUM CITRATE 334-500 MG/5ML PO SOLN: 30.0000 mL | ORAL | Status: DC | PRN

## 2011-09-25 MED ORDER — IBUPROFEN 600 MG PO TABS: 600.0000 mg | ORAL_TABLET | Freq: Four times a day (QID) | ORAL | Status: DC | PRN

## 2011-09-25 MED ORDER — ONDANSETRON HCL 4 MG/2ML IJ SOLN: 4.0000 mg | Freq: Four times a day (QID) | INTRAMUSCULAR | Status: DC | PRN

## 2011-09-25 MED ORDER — LACTATED RINGERS IV SOLN: 500.0000 mL | INTRAVENOUS | Status: DC | PRN

## 2011-09-25 NOTE — H&P (Signed)
Vanessa Larson is a 30 y.o. female presenting for SOL.  Patient seen at doctor's office today, had been feeling contractions that started last night and have progressed throughout the day.  Had cervical change from 1cm and thick to 3 cm and 100%. Maternal Medical History:  Reason for admission: Reason for admission: contractions.  Contractions: Onset was yesterday.   Frequency: regular.   Duration is approximately 30 seconds.   Perceived severity is moderate.    Fetal activity: Perceived fetal activity is normal.   Last perceived fetal movement was within the past hour.    Prenatal complications: IUGR.   Prenatal Complications - Diabetes: none.    OB History    Grav Para Term Preterm Abortions TAB SAB Ect Mult Living   2 1 1      1 2      Past Medical History  Diagnosis Date  . Anxiety   . Depression   . Pregnancy related condition in third trimester     [redacted] weeks gestation complicated by depression/anxiety   Past Surgical History  Procedure Date  . Cesarean section    Family History: family history includes Depression in her mother. Social History:  reports that she has never smoked. She has never used smokeless tobacco. She reports that she does not drink alcohol or use illicit drugs.  Review of Systems  All other systems reviewed and are negative.      Blood pressure 156/65, pulse 91, temperature 98.2 F (36.8 C), temperature source Oral, resp. rate 20, height 5\' 6"  (1.676 m), weight 75.297 kg (166 lb), last menstrual period 12/02/2010. Maternal Exam:  Uterine Assessment: Contraction strength is moderate.  Contraction duration is 30 seconds. Contraction frequency is regular.   Abdomen: Surgical scars: low transverse.   Fundal height is term.   Estimated fetal weight is 7.5#.   Fetal presentation: vertex  Introitus: Ferning test: not done.  Nitrazine test: not done.  Pelvis: adequate for delivery.   Cervix: Cervix evaluated by digital exam.     Fetal  Exam Fetal Monitor Review: Mode: hand-held doppler probe.   Baseline rate: 140.  Variability: moderate (6-25 bpm).   Pattern: accelerations present and no decelerations.    Fetal State Assessment: Category I - tracings are normal.     Physical Exam  Constitutional: She is oriented to person, place, and time. She appears well-developed and well-nourished.  Cardiovascular: Normal rate.   Respiratory: Effort normal.  GI: Soft. She exhibits no distension and no mass. There is no tenderness. There is no rebound and no guarding.  Musculoskeletal: She exhibits no edema.  Neurological: She is alert and oriented to person, place, and time.  Skin: Skin is warm and dry.    Prenatal labs: ABO, Rh: A/--/-- (11/26 1340) Antibody: Negative (05/15 0000) Rubella: Immune (05/15 0000) RPR: NON REAC (08/27 1435)  HBsAg: Negative (05/15 0000)  HIV: Non-reactive (11/26 1340)  GBS: Negative (11/26 1340)   Assessment/Plan: #1 30 yearold G2P1002 at 40weeks 2 days #2 TOLAC #3 GBS neg #4 rh +  Will admit to L&D.  Will allow for intermittent monitoring as the baby appears well.  [facility] will be the pediatric provider.  The patient wishes to breastfeed.    STINSON, JACOB JEHIEL 09/25/2011, 2:28 PM

## 2011-09-25 NOTE — Progress Notes (Signed)
Seen in MAU yesterday with poss leaking of fluid.  Dilation of cervix was 1cm  50% effaced.  P-79

## 2011-09-25 NOTE — Progress Notes (Signed)
Was at dr's office this morning.  Ctx's were 3-68min.  cx was 3+cm and thinned.  Started having some bleeding after exam.

## 2011-09-25 NOTE — Progress Notes (Addendum)
Vanessa Larson is a 30 y.o. G2P1002 at [redacted]w[redacted]d by ultrasound admitted for rupture of membranes  Subjective: Plans trial of labor and waterbirth. Previous C/S for twins first pregnancy  Objective: BP 114/65  Pulse 77  Temp(Src) 98.1 F (36.7 C) (Oral)  Resp 20  Ht 5\' 6"  (1.676 m)  Wt 75.297 kg (166 lb)  BMI 26.79 kg/m2  LMP 12/02/2010      FHT:  FHR: 140 bpm, variability: moderate,  accelerations:  Present,  decelerations:  Absent UC:   irregular, every 4 minutes SVE:   Dilation: 4.5 Effacement (%): 100 Station: 0 Exam by:: [place] CNM  Labs: Lab Results  Component Value Date   WBC 11.3* 09/25/2011   HGB 11.1* 09/25/2011   HCT 33.9* 09/25/2011   MCV 87.8 09/25/2011   PLT 171 09/25/2011    Assessment / Plan: Spontaneous labor, progressing normally Prior C/S, desires Trial of Labor  Plans noninterventive waterbirth  Labor: Progressing normally Preeclampsia:  n/a Fetal Wellbeing:  Category I Pain Control:  Labor support without medications I/D:  n/a Anticipated MOD:  NSVD  Haani Bakula 09/25/2011, 9:09 PM

## 2011-09-25 NOTE — Progress Notes (Signed)
UC's getting stronger throughout night. Pos FM and bloody show. No LOF. 1 In MAU yesterday. Plans VBAC water birth. Sent to MAU.

## 2011-09-26 ENCOUNTER — Encounter (HOSPITAL_COMMUNITY): Payer: Self-pay | Admitting: *Deleted

## 2011-09-26 ENCOUNTER — Ambulatory Visit (HOSPITAL_COMMUNITY): Payer: 59 | Admitting: Psychology

## 2011-09-26 DIAGNOSIS — O34219 Maternal care for unspecified type scar from previous cesarean delivery: Secondary | ICD-10-CM

## 2011-09-26 MED ORDER — TETANUS-DIPHTH-ACELL PERTUSSIS 5-2.5-18.5 LF-MCG/0.5 IM SUSP
0.5000 mL | Freq: Once | INTRAMUSCULAR | Status: AC
  Administered 2011-09-27: 0.5 mL via INTRAMUSCULAR
  Filled 2011-09-26: qty 0.5

## 2011-09-26 MED ORDER — ZOLPIDEM TARTRATE 5 MG PO TABS: 5.0000 mg | ORAL_TABLET | Freq: Every evening | ORAL | Status: DC | PRN

## 2011-09-26 MED ORDER — ONDANSETRON HCL 4 MG/2ML IJ SOLN: 4.0000 mg | INTRAMUSCULAR | Status: DC | PRN

## 2011-09-26 MED ORDER — SENNOSIDES-DOCUSATE SODIUM 8.6-50 MG PO TABS: 2.0000 | ORAL_TABLET | Freq: Every day | ORAL | Status: DC

## 2011-09-26 MED ORDER — IBUPROFEN 600 MG PO TABS
600.0000 mg | ORAL_TABLET | Freq: Four times a day (QID) | ORAL | Status: DC
  Administered 2011-09-26 – 2011-09-27 (×5): 600 mg via ORAL
  Filled 2011-09-26 (×5): qty 1

## 2011-09-26 MED ORDER — WITCH HAZEL-GLYCERIN EX PADS: 1.0000 "application " | MEDICATED_PAD | CUTANEOUS | Status: DC | PRN

## 2011-09-26 MED ORDER — LANOLIN HYDROUS EX OINT: TOPICAL_OINTMENT | CUTANEOUS | Status: DC | PRN

## 2011-09-26 MED ORDER — DIPHENHYDRAMINE HCL 25 MG PO CAPS: 25.0000 mg | ORAL_CAPSULE | Freq: Four times a day (QID) | ORAL | Status: DC | PRN

## 2011-09-26 MED ORDER — OXYCODONE-ACETAMINOPHEN 5-325 MG PO TABS: 1.0000 | ORAL_TABLET | ORAL | Status: DC | PRN

## 2011-09-26 MED ORDER — PRENATAL PLUS 27-1 MG PO TABS
1.0000 | ORAL_TABLET | Freq: Every day | ORAL | Status: DC
  Administered 2011-09-26 – 2011-09-27 (×2): 1 via ORAL
  Filled 2011-09-26 (×2): qty 1

## 2011-09-26 MED ORDER — BENZOCAINE-MENTHOL 20-0.5 % EX AERO: 1.0000 "application " | INHALATION_SPRAY | CUTANEOUS | Status: DC | PRN

## 2011-09-26 MED ORDER — DIBUCAINE 1 % RE OINT: 1.0000 "application " | TOPICAL_OINTMENT | RECTAL | Status: DC | PRN

## 2011-09-26 MED ORDER — SIMETHICONE 80 MG PO CHEW: 80.0000 mg | CHEWABLE_TABLET | ORAL | Status: DC | PRN

## 2011-09-26 MED ORDER — ONDANSETRON HCL 4 MG PO TABS: 4.0000 mg | ORAL_TABLET | ORAL | Status: DC | PRN

## 2011-09-26 MED ORDER — BENZOCAINE-MENTHOL 20-0.5 % EX AERO
INHALATION_SPRAY | CUTANEOUS | Status: AC
  Filled 2011-09-26: qty 56

## 2011-09-26 NOTE — Progress Notes (Signed)
Patient ID: Vanessa Larson, female   DOB: September 16, 1981, 30 y.o.   MRN: [id number]  Doing well. Now in tub.  FHR stable per RN report.  UCs q 2-3 minutes  Cervix 7-8cm per RN  Will continue to observe and Anticipate SVD

## 2011-09-26 NOTE — Progress Notes (Signed)
Patient ID: Vanessa Larson, female   DOB: 1981/02/03, 30 y.o.   MRN: [id number]  Doing well, but pain increasing.  Using ball and other relaxation methods.  FHR stable with contractions every 2-4 minutes  Cervix 5-6/90/-1/vtx with bloody show  Encouraged to continue relaxation techniques  Anticipate SVD

## 2011-09-26 NOTE — Progress Notes (Signed)

## 2011-09-27 MED ORDER — BENZOCAINE-MENTHOL 20-0.5 % EX AERO
INHALATION_SPRAY | CUTANEOUS | Status: AC
  Filled 2011-09-27: qty 56

## 2011-09-27 MED ORDER — ACETAMINOPHEN 325 MG PO TABS: 325.0000 mg | ORAL_TABLET | Freq: Four times a day (QID) | ORAL | Status: DC | PRN

## 2011-09-27 NOTE — Progress Notes (Signed)
Post Partum Day 1 SVD Subjective: up ad lib, voiding, tolerating PO and + flatus  Objective: Blood pressure 115/67, pulse 71, temperature 97.6 F (36.4 C), temperature source Oral, resp. rate 18, height 5\' 6"  (1.676 m), weight 75.297 kg (166 lb), last menstrual period 12/02/2010, unknown if currently breastfeeding.  Physical Exam:  General: alert, cooperative and no distress Lochia: appropriate. Reports greater than a period yesterday after delivery but lessening to around a period yesterday evening.  Uterine Fundus: firm  DVT Evaluation: No cords or calf tenderness. No significant calf/ankle edema.   Basename 09/25/11 1409  HGB 11.1*  HCT 33.9*    Assessment/Plan: Plan for discharge tomorrow, Breastfeeding, Lactation consult and Contraception undecided   LOS: 2 days   Vanessa Larson 09/27/2011, 7:45 AM

## 2011-09-27 NOTE — Discharge Summary (Signed)
Obstetric Discharge Summary Reason for Admission: onset of labor Prenatal Procedures: none Intrapartum Procedures: water birth, TOLAC Postpartum Procedures: none Complications-Operative and Postpartum: vaginal laceration Hemoglobin  Date Value Range Status  09/25/2011 11.1* 12.0-15.0 (g/dL) Final     HCT  Date Value Range Status  09/25/2011 33.9* 36.0-46.0 (%) Final    Discharge Diagnoses: Term Pregnancy-delivered  Discharge Information: Date: 09/27/2011 Activity: pelvic rest Diet: routine Medications: None Condition: stable Instructions: refer to practice specific booklet Discharge to: home Follow-up Information    Follow up with [facility]. Call in 6 weeks. (you cancall to make your appointment anytime.  Please call or retunr to care if you have questions or concerns before then. )    Contact information:   [address] [place] [address]          Newborn Data: Live born female  Birth Weight: 8 lb 6.8 oz (3822 g) APGAR: 8, 9  Home with mother.  Maren Reamer, Adeleigh Barletta 09/27/2011, 12:40 PM

## 2011-10-09 ENCOUNTER — Ambulatory Visit (HOSPITAL_COMMUNITY): Payer: 59 | Admitting: Psychiatry

## 2011-11-13 ENCOUNTER — Encounter: Payer: Self-pay | Admitting: Advanced Practice Midwife

## 2011-11-13 ENCOUNTER — Ambulatory Visit (INDEPENDENT_AMBULATORY_CARE_PROVIDER_SITE_OTHER): Payer: BC Managed Care – PPO | Admitting: Advanced Practice Midwife

## 2011-11-13 NOTE — Progress Notes (Signed)
  Subjective:     Vanessa Larson is a 31 y.o. female who presents for a postpartum visit. She is 6 weeks postpartum following a IT consultant. I have fully reviewed the prenatal and intrapartum course. The delivery was at 40 gestational weeks. Outcome: vaginal birth after cesarean (VBAC). Anesthesia: none. Postpartum course has been normal. Baby's course has been normal. Baby is feeding by breast. Bleeding no bleeding. Bowel function is normal. Bladder function is normal. Patient is not sexually active. Contraception method is none. Postpartum depression screening: negative.   Review of Systems Pertinent items are noted in HPI.   Objective:    BP 100/68  Pulse 83  Temp(Src) 97.8 F (36.6 C) (Oral)  Resp 16  Ht 5\' 5"  (1.651 m)  Wt 148 lb (67.132 kg)  BMI 24.63 kg/m2  Breastfeeding? Yes  General:  alert, cooperative and no distress  Abdomen: soft, non-tender; bowel sounds normal; no masses,  no organomegaly  Corpus: normal        Assessment:    6 weel postpartum exam. Pap smear not done at today's visit.   Plan:    1. Contraception: discussed, pt desires method that will not interfere with breastfeeding and that prevents ovulation - discussed POPs, nuvaring, implanon, depo provera. Undecided at this time, will f/u when she decides on method, condoms until then3 2. Follow up in: 1 year or as needed.

## 2011-12-11 ENCOUNTER — Ambulatory Visit (INDEPENDENT_AMBULATORY_CARE_PROVIDER_SITE_OTHER): Payer: BC Managed Care – PPO | Admitting: Psychology

## 2011-12-11 ENCOUNTER — Encounter (HOSPITAL_COMMUNITY): Payer: Self-pay | Admitting: Psychology

## 2011-12-11 DIAGNOSIS — F429 Obsessive-compulsive disorder, unspecified: Secondary | ICD-10-CM

## 2011-12-11 NOTE — Patient Instructions (Signed)
1-Begin spending a little time in places that bring you stress.  For example, sit in your yard on a nice day and feed your daughter or do to the park. 2-Contact Dr. Ferol Luz and inform that she is not a match for you and that you would like another psychiatrist. 3-Pay more attention to your children instead of your cleaning; I know they are your number one priority.

## 2011-12-12 DIAGNOSIS — F429 Obsessive-compulsive disorder, unspecified: Secondary | ICD-10-CM | POA: Insufficient documentation

## 2011-12-12 NOTE — Progress Notes (Signed)
   THERAPIST PROGRESS NOTE  Session Time: 1205- 100 pm  Participation Level: Active  Behavioral Response: Well GroomedAlertAnxious- when talking about fleas  Type of Therapy: Individual Therapy  Treatment Goals addressed: Anxiety and Coping  Interventions: CBT, Solution Focused, Strength-based, Psychosocial Skills: coping, communication with husband and Supportive  Summary: Vanessa Larson is a 31 y.o. female who presents with her two 42 old daughter Vanessa Larson.  She has resumed therapy due to the increase in obsessive compulsive issues.  She has been doing well since the birth of her 3rd child and reports that she is a good baby and that it is so much easier to care for one infant than when she had her twin sons two years ago.  She is enjoying the baby and also her twins.  Her mother was in [place] helping until about two weeks ago and she admits she is glad that her mother has gone.  While she liked having her mother here it created a lot of additional stress (her mother was in town for two months while her home was finished being built in [place]).  She is no longer depressed and thinks it was directly related to the amount of stressors she was managing (pregnant, two year old twins, moving from [place], having no friends, marital issues, and problems with flea infestation due to old renter.  The patient has began having increased anxiety related to the fleas and she admits that she believes she is OCD and I concur.  She is unable to tend to the needs of herself or her children if she even suspects there is a flea around her children.  She admits that cognitively she knows her behavior is inappropriate but that she cannot shift gears.  She is obsessed with a clean home and is unable to relax.  She has had intrusive thoughts for years but they have gotten more out of control.  It is interfering with her marriage and her husband told her he didn't care what she did but she needed to get the issue addressed.   She admits that she knows it is effecting the quality of life for her, her husband and her children.  We talked about medication and she admits to not taking the Prozac because after researching and talking with her OB's office they agreed that this was not the best drug for a [redacted] week pregnant mother who was (and is) breastfeeding.  She is willing to consider medication but doesn't feel that Dr. Ferol Luz was a good fit for her; I suggested she contact Dr. Ferol Luz to discuss and request referral to Dr. Christell Constant.  She is very scared of doing anything that would endanger her daughter and has started pumping excess milk in case she can no longer breastfeed because of mediation needs.  We talked about distracting negative thinking and came up with some ideas for her to practice as listed on her instructions.  Suicidal/Homicidal: No  Plan: Return again in 2 weeks.  Diagnosis: Axis I: Obsessive Compulsive Disorder    Axis II: No diagnosis    Vanessa Larson, [facility] 12/12/2011

## 2011-12-15 ENCOUNTER — Ambulatory Visit: Payer: Self-pay

## 2011-12-25 ENCOUNTER — Ambulatory Visit (HOSPITAL_COMMUNITY): Payer: BC Managed Care – PPO | Admitting: Psychology

## 2012-01-01 ENCOUNTER — Ambulatory Visit (INDEPENDENT_AMBULATORY_CARE_PROVIDER_SITE_OTHER): Payer: BC Managed Care – PPO | Admitting: Psychology

## 2012-01-01 ENCOUNTER — Encounter (HOSPITAL_COMMUNITY): Payer: Self-pay | Admitting: Psychology

## 2012-01-01 DIAGNOSIS — F32A Depression, unspecified: Secondary | ICD-10-CM

## 2012-01-01 DIAGNOSIS — F3289 Other specified depressive episodes: Secondary | ICD-10-CM

## 2012-01-01 DIAGNOSIS — F605 Obsessive-compulsive personality disorder: Secondary | ICD-10-CM

## 2012-01-01 DIAGNOSIS — F329 Major depressive disorder, single episode, unspecified: Secondary | ICD-10-CM

## 2012-01-01 DIAGNOSIS — F429 Obsessive-compulsive disorder, unspecified: Secondary | ICD-10-CM

## 2012-01-01 DIAGNOSIS — IMO0001 Reserved for inherently not codable concepts without codable children: Secondary | ICD-10-CM

## 2012-01-01 NOTE — Progress Notes (Signed)
   THERAPIST PROGRESS NOTE  Session Time: 1203- 111pm  Participation Level: Active  Behavioral Response: Well GroomedAlertEuthymic  Type of Therapy: Individual Therapy  Treatment Goals addressed: Anxiety and Coping  Interventions: CBT, Solution Focused, Strength-based, Psychosocial Skills: coping, communication with husband and Supportive  Summary: Vanessa Larson is a 31 y.o. female who presents with her infant daughter [name].  The patient thinks she has been doing better in managing her symptoms but admits that she hasn't done anything suggested at last visit due to the weather and trying to adapt to having three children under three.  She reports not wanting to consider medication at this time and really doesn't want to use medication but wants to learn how to manage on her own.  She was interested in my opinion if she had OCD and I stated I was not sure if if was personality disorder or OCD.  She was open to suggestions and I reminded her that therapy was only as effective as what she did outside the office to make changes.  She acknowledges that she wants to make changes and was willing to accept suggestions.  Her fear of fleas has caused major issues within her marriage and for her children since they have only played outside in the yard about five times since they moved to [place] seven months ago.  We discussed strategies for thought blocking, increasing her structure, and creating opportunities for outdoor activities.  She admits that she is unsure how she feels about her husband and that when they went out alone when her mother was still here she did not have fun with him and never really does.  I reminded her that they are under a tremendous amount of stress and that he is not really happy here either and she agreed this was true.  Suicidal/Homicidal: No  Plan: Return again in 2 weeks.  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

## 2012-01-22 ENCOUNTER — Ambulatory Visit (HOSPITAL_COMMUNITY): Payer: Self-pay | Admitting: Psychology

## 2012-02-19 ENCOUNTER — Encounter (HOSPITAL_COMMUNITY): Payer: Self-pay | Admitting: Psychology

## 2012-02-19 ENCOUNTER — Ambulatory Visit (INDEPENDENT_AMBULATORY_CARE_PROVIDER_SITE_OTHER): Payer: BC Managed Care – PPO | Admitting: Psychology

## 2012-02-19 DIAGNOSIS — F429 Obsessive-compulsive disorder, unspecified: Secondary | ICD-10-CM

## 2012-02-19 DIAGNOSIS — F419 Anxiety disorder, unspecified: Secondary | ICD-10-CM

## 2012-02-19 DIAGNOSIS — F411 Generalized anxiety disorder: Secondary | ICD-10-CM

## 2012-02-19 NOTE — Progress Notes (Signed)
   THERAPIST PROGRESS NOTE  Session Time: 805-  Participation Level: Active  Behavioral Response: Well GroomedAlertEuthymic  Type of Therapy: Individual Therapy  Treatment Goals addressed: Anxiety and Coping  Interventions: Solution Focused, Strength-based, Psychosocial Skills: coping with fears and anxiety and Supportive  Summary: Vanessa Larson is a 31 y.o. female who presents with her four month old daughter [name].  The patient is pleasant and easily engaged.  She is pleased to share that she has made some progress in her fear of fleas and other bugs.  She reports that she has had her boys out in the yard with her husband and this is a Management consultant.  She was agreeable to having an easter egg hunt in their front yard and shared the video.  She admits this might not appear as a big deal but that it was for her and she feels good about this.  I complimented the patient on her progress and encouraged her to continue engaging outdoor activities.  She is having some concerns about her almost three year old twin [name] who is sensorially, socially and emotionally appearing younger than his stated age.  She and her spouse had an argument about this but since her best friend Toni Amend was in town for a visit he has agreed that there might be an issue.  I encouraged her to follow her gut and follow through with the pediatric appointment to discuss her concerns.  We addressed how to manage having her mother visit for a month when she doesn't follow her requests for how to manage the children.  I suggested she discuss her concerns with her mother prior to her visit.  I suggested this was an opportunity for her to get som individual time for herself, and with each of her children.  She will be having visitors most of the month of May and June and admits to feeling somewhat overwhelmed by this.  Her in laws were here for a visit and discussed sand fleas and she walked away from the conversation.  It caused  her to begin focusing again on fleas.  She was able to follow a logical process about visiting the beach since she lived there for ten years and never had a problem with these intrusive thoughts.  She admits when using the logic she is able to work through her feelings and I suggested she begin to utilize her logic more often.  Suicidal/Homicidal: No  Plan: Return again in 2 weeks.  Diagnosis: Axis I: Anxiety Disorder NOS and Obsessive Compulsive Disorder    Axis II: No diagnosis    [name], [facility] 02/19/2012

## 2012-04-08 ENCOUNTER — Ambulatory Visit (INDEPENDENT_AMBULATORY_CARE_PROVIDER_SITE_OTHER): Payer: 59 | Admitting: Psychology

## 2012-04-08 ENCOUNTER — Encounter (HOSPITAL_COMMUNITY): Payer: Self-pay | Admitting: Psychology

## 2012-04-08 DIAGNOSIS — F411 Generalized anxiety disorder: Secondary | ICD-10-CM

## 2012-04-08 DIAGNOSIS — F419 Anxiety disorder, unspecified: Secondary | ICD-10-CM

## 2012-04-08 DIAGNOSIS — F40298 Other specified phobia: Secondary | ICD-10-CM

## 2012-04-08 DIAGNOSIS — F329 Major depressive disorder, single episode, unspecified: Secondary | ICD-10-CM

## 2012-04-08 DIAGNOSIS — F3289 Other specified depressive episodes: Secondary | ICD-10-CM

## 2012-04-08 DIAGNOSIS — F40218 Other animal type phobia: Secondary | ICD-10-CM

## 2012-04-08 DIAGNOSIS — F32A Depression, unspecified: Secondary | ICD-10-CM

## 2012-04-08 NOTE — Progress Notes (Signed)
   THERAPIST PROGRESS NOTE  Session Time: 200- 256 pm  Participation Level: Active  Behavioral Response: NeatAlertAnxious  Type of Therapy: Individual Therapy  Treatment Goals addressed: Anxiety and Coping  Interventions: CBT, Solution Focused, Strength-based, Psychosocial Skills: coping, communication with spouse and Supportive  Summary: Vanessa Larson is a 31 y.o. female who presents as pleasant and easily engaged.  She has had an increase in her obsessive thinking and compulsive behaviors related to her phobia of insects.  The patient reports that her marriage is suffering because of her thoughts and behaviors.  The patient talked about knowing that she was regressing in terms of the bug phobia but couldn't gain control of it and waited too long to come in for a session.  The patient feels conflicted about considering medication despite the fact that she has been struggling for years with anxiety and likely OCD.  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  She reports that her husband is in favor of medication and suggested that she get placed on something to deal with her issues.  I suggested that she has been attempting to deal with the anxieties, the obsessions, and the compulsions for about nine months and they continue to be an issue for her and have increased the marital issues.  The patient is overly critical of her husband and this has been a big issue as  well.  She is unable to find anything positive and has limited positive interactions with him and dreads him coming home.  He appears to be trying but the patient focuses so much on her anxiety that it has become a barrier in their communication.  Suicidal/Homicidal: No  Plan: Return again in 2 weeks. Schedule appointment for medication evaluation with Dr. Laury Deep.  Diagnosis: Axis I: Generalized Anxiety Disorder; R/O Obsessive Compulsive Disorder    Axis II: Deferred    [name], [facility] 04/08/2012

## 2012-04-17 ENCOUNTER — Encounter (HOSPITAL_COMMUNITY): Payer: Self-pay

## 2012-04-17 ENCOUNTER — Ambulatory Visit (INDEPENDENT_AMBULATORY_CARE_PROVIDER_SITE_OTHER): Payer: BC Managed Care – PPO | Admitting: Psychology

## 2012-04-17 DIAGNOSIS — F429 Obsessive-compulsive disorder, unspecified: Secondary | ICD-10-CM

## 2012-04-17 DIAGNOSIS — F32A Depression, unspecified: Secondary | ICD-10-CM

## 2012-04-17 DIAGNOSIS — F419 Anxiety disorder, unspecified: Secondary | ICD-10-CM

## 2012-04-17 DIAGNOSIS — F40218 Other animal type phobia: Secondary | ICD-10-CM

## 2012-04-17 DIAGNOSIS — F329 Major depressive disorder, single episode, unspecified: Secondary | ICD-10-CM

## 2012-04-17 DIAGNOSIS — F40298 Other specified phobia: Secondary | ICD-10-CM

## 2012-04-17 DIAGNOSIS — F3289 Other specified depressive episodes: Secondary | ICD-10-CM

## 2012-04-17 DIAGNOSIS — F411 Generalized anxiety disorder: Secondary | ICD-10-CM

## 2012-04-19 ENCOUNTER — Encounter (HOSPITAL_COMMUNITY): Payer: Self-pay | Admitting: Psychology

## 2012-04-19 NOTE — Progress Notes (Signed)
THERAPIST PROGRESS NOTE  Session Time: 305- 358 pm  Participation Level: Active  Behavioral Response: NeatAlertEuthymic  Type of Therapy: Individual Therapy  Treatment Goals addressed: Anger, Anxiety, Communication: with husband and Coping  Interventions: CBT, Solution Focused, Strength-based, Psychosocial Skills: thought blocking and positive thought insertion, distraction, coping skills and Supportive  Summary: Vanessa Larson is a 31 y.o. female who presents with her 70 month old daughter Vanessa Larson. This past Monday, the patient urgently contacted the office citing that she was falling apart and was having major issues with her anxiety and as a result in her marriage.  After phone consultation with this counselor who was out of the office at training, the patient spoke with [facility] Serafina Mitchell who provided support, recommendations, and today's appointment with this counselor. The patient reports she is not doing well and has actually questioned the need for inpatient admission.  She denies any suicidal or homicidal thoughts, plans or actions and reports she will never hurt herself or someone else.  She has extreme anxiety and fear about her grandparents coming for a visit and bringing bedbugs with them.  She knows that on their travels to her home they will be staying in hotels and she has been at the online site for bedbugs and is warning them not to stay where they had previously arranged.  She admits that she is not able to enjoy them coming because of this fear.  She and her husband cannot have a decent conversation because she is so obsessed with this and other insects that she feels unsupported and he tells her he doesn't know what to do and that she just needs to get over it.  The patient feels her marriage collapsing under the pressure of her obsessive thinking and compulsive cleaning and researching of how to prepare to manage bedbugs.  Her previous phobia of tics has switched to tics and  bedbugs.  She is never free of the thinking or worrying and she thinks she is incapable of escaping her thoughts.  I provided education on the importance of distraction and of thought blocking and thought insertion.  She knows from a logical perspective that her thoughts are excessive but she cannot escape them.  She is willing to try and was able to identify some alternate activities to engage to keep her mind busy.  I suggested that she needed to take care of her emotional self and she admits that going to the gym has helped but she doesn't think she has the time because of the guilt she feels leaving her husband home with three small children are a busy day at work.  She dreads when her husband is about to return home and when he was away last Saturday fishing with friends she really liked that he was not there.  She feels critized and unsupported.  I suggested that she consider mental health IOP treatment to help her to gain support, education/skills, and to more intensively treat the problems.  She admits that she switches from one obsession/compulsion to another and has realized this the more she learns and thinks about how she has lived her life.  She would not be willing to proceed with medication evaluation if she was single but since it is compromising her marriage and therefore her children she will do whatever is best.  She has an appointment with Dr. Laury Deep within the next couple weeks.  Suicidal/Homicidal: No  Plan: Return again in 1 weeks.  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

## 2012-04-24 ENCOUNTER — Ambulatory Visit (HOSPITAL_COMMUNITY): Payer: Self-pay | Admitting: Psychology

## 2012-04-25 ENCOUNTER — Ambulatory Visit (HOSPITAL_COMMUNITY): Payer: Self-pay | Admitting: Psychology

## 2012-04-29 ENCOUNTER — Ambulatory Visit (INDEPENDENT_AMBULATORY_CARE_PROVIDER_SITE_OTHER): Payer: BC Managed Care – PPO | Admitting: Psychiatry

## 2012-04-29 ENCOUNTER — Encounter (HOSPITAL_COMMUNITY): Payer: Self-pay | Admitting: Psychiatry

## 2012-04-29 VITALS — BP 114/73 | HR 77 | Ht 65.5 in | Wt 146.0 lb

## 2012-04-29 DIAGNOSIS — F40218 Other animal type phobia: Secondary | ICD-10-CM

## 2012-04-29 DIAGNOSIS — F40298 Other specified phobia: Secondary | ICD-10-CM
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
## 2012-05-13 ENCOUNTER — Ambulatory Visit (HOSPITAL_COMMUNITY): Payer: Self-pay | Admitting: Psychology

## 2012-05-24 ENCOUNTER — Telehealth (HOSPITAL_COMMUNITY): Payer: Self-pay | Admitting: Psychiatry

## 2012-05-26 NOTE — Telephone Encounter (Signed)
Erroneous encounter

## 2012-05-27 ENCOUNTER — Encounter (HOSPITAL_COMMUNITY): Payer: Self-pay | Admitting: Psychiatry

## 2012-05-27 ENCOUNTER — Ambulatory Visit (INDEPENDENT_AMBULATORY_CARE_PROVIDER_SITE_OTHER): Payer: BC Managed Care – PPO | Admitting: Psychiatry

## 2012-05-27 VITALS — BP 124/80 | HR 83 | Ht 65.5 in | Wt 146.0 lb

## 2012-05-27 DIAGNOSIS — F40298 Other specified phobia: Secondary | ICD-10-CM
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
## 2012-06-21 ENCOUNTER — Ambulatory Visit (INDEPENDENT_AMBULATORY_CARE_PROVIDER_SITE_OTHER): Payer: BC Managed Care – PPO | Admitting: Advanced Practice Midwife

## 2012-06-21 ENCOUNTER — Encounter: Payer: Self-pay | Admitting: Advanced Practice Midwife

## 2012-06-21 VITALS — BP 107/71 | Temp 98.5°F | Wt 146.0 lb

## 2012-06-21 DIAGNOSIS — Z3480 Encounter for supervision of other normal pregnancy, unspecified trimester: Secondary | ICD-10-CM

## 2012-06-21 DIAGNOSIS — Z3490 Encounter for supervision of normal pregnancy, unspecified, unspecified trimester: Secondary | ICD-10-CM

## 2012-06-21 DIAGNOSIS — O3421 Maternal care for scar from previous cesarean delivery: Secondary | ICD-10-CM

## 2012-06-21 DIAGNOSIS — E049 Nontoxic goiter, unspecified: Secondary | ICD-10-CM

## 2012-06-21 DIAGNOSIS — E010 Iodine-deficiency related diffuse (endemic) goiter: Secondary | ICD-10-CM

## 2012-06-21 DIAGNOSIS — O34219 Maternal care for unspecified type scar from previous cesarean delivery: Secondary | ICD-10-CM

## 2012-06-21 LAB — TSH: TSH: 1.292 u[IU]/mL (ref 0.350–4.500)

## 2012-06-21 LAB — HIV ANTIBODY (ROUTINE TESTING W REFLEX): HIV: NONREACTIVE

## 2012-06-21 NOTE — Progress Notes (Signed)
  Subjective:    Vanessa Larson is being seen today for her first obstetrical visit.  This is not a planned pregnancy. She is at [redacted]w[redacted]d gestation by date of conception. Her obstetrical history is significant for prio C/S for twins followed by successful VBAC, close pregancy spacing.. Relationship with FOB: spouse, living together. Patient does intend to breast feed. Pregnancy history fully reviewed.  Patient reports no complaints.  Review of Systems:   Review of Systems: Pertinent findings in HPI.  Objective:     BP 107/71  Temp 98.5 F (36.9 C)  Wt 146 lb (66.225 kg)  LMP 04/01/2012  Breastfeeding? Yes Physical Exam  Exam Physical Examination: General appearance - alert, well appearing, and in no distress and oriented to person, place, and time Neck - supple, no significant adenopathy, Thyromegally Chest - clear to auscultation, no wheezes, rales or rhonchi, symmetric air entry Heart - normal rate, regular rhythm, normal S1, S2, no murmurs, rubs, clicks or gallops Abdomen - soft, nontender, nondistended, no masses or organomegaly Back exam - No CVAT Extremities - no pedal edema noted, Homan's sign negative bilaterally  Assessment:    Pregnancy: [id number] Patient Active Problem List  Diagnosis  . Phobia to insects  . Depression  . Obsessive-compulsive personality trait  . Previous cesarean delivery affecting pregnancy  . Obsessive compulsive disorder  . Anxiety   Plan:   1. Supervision of normal pregnancy  Prenatal (OB Panel), HIV antibody, Culture, OB Urine  2. Thyromegaly  TSH  3. Previous cesarean delivery affecting pregnancy     Initial labs drawn. Prenatal vitamins. Problem list reviewed and updated. AFP3 discussed: declined. Role of ultrasound in pregnancy discussed; fetal survey: at 18-20 weeks. Amniocentesis discussed: not indicated. Follow up in 4 weeks. 75% of 30 min visit spent on counseling and coordination of care.  Plans VBAC.   Dorathy Kinsman 06/21/2012

## 2012-06-21 NOTE — Progress Notes (Signed)
p-85 

## 2012-06-22 LAB — OBSTETRIC PANEL
HCT: 35.4 % — ABNORMAL LOW (ref 36.0–46.0)
Hemoglobin: 11.8 g/dL — ABNORMAL LOW (ref 12.0–15.0)
Hepatitis B Surface Ag: NEGATIVE
Lymphs Abs: 1.1 10*3/uL (ref 0.7–4.0)
Monocytes Absolute: 0.5 10*3/uL (ref 0.1–1.0)
Monocytes Relative: 9 % (ref 3–12)
Neutro Abs: 4.2 10*3/uL (ref 1.7–7.7)
Neutrophils Relative %: 71 % (ref 43–77)
RBC: 4.4 MIL/uL (ref 3.87–5.11)
Rh Type: POSITIVE
Rubella: 19.6 IU/mL — ABNORMAL HIGH

## 2012-06-23 LAB — CULTURE, OB URINE: Colony Count: 30000

## 2012-07-22 ENCOUNTER — Ambulatory Visit (INDEPENDENT_AMBULATORY_CARE_PROVIDER_SITE_OTHER): Payer: BC Managed Care – PPO | Admitting: Advanced Practice Midwife

## 2012-07-22 ENCOUNTER — Encounter: Payer: Self-pay | Admitting: Advanced Practice Midwife

## 2012-07-22 VITALS — BP 102/64 | Wt 146.0 lb

## 2012-07-22 DIAGNOSIS — O34219 Maternal care for unspecified type scar from previous cesarean delivery: Secondary | ICD-10-CM

## 2012-07-22 DIAGNOSIS — Z3480 Encounter for supervision of other normal pregnancy, unspecified trimester: Secondary | ICD-10-CM

## 2012-07-22 DIAGNOSIS — Z13228 Encounter for screening for other metabolic disorders: Secondary | ICD-10-CM | POA: Insufficient documentation

## 2012-07-22 DIAGNOSIS — O3421 Maternal care for scar from previous cesarean delivery: Secondary | ICD-10-CM

## 2012-07-22 DIAGNOSIS — Z3490 Encounter for supervision of normal pregnancy, unspecified, unspecified trimester: Secondary | ICD-10-CM

## 2012-07-22 DIAGNOSIS — Z01419 Encounter for gynecological examination (general) (routine) without abnormal findings: Secondary | ICD-10-CM | POA: Insufficient documentation

## 2012-07-22 DIAGNOSIS — Z7689 Persons encountering health services in other specified circumstances: Secondary | ICD-10-CM | POA: Insufficient documentation

## 2012-07-22 NOTE — Progress Notes (Signed)
p-87  Intermitt. Lt pelvic pain and light spotting since finding out she was pregnant.

## 2012-07-22 NOTE — Progress Notes (Signed)
Doing well.  Some sleep disturbance related to 100 month old.  Wants to do waterbirth again. Declines genetic screening.

## 2012-07-22 NOTE — Patient Instructions (Signed)
Pregnancy - Second Trimester The second trimester of pregnancy (3 to 6 months) is a period of rapid growth for you and your baby. At the end of the sixth month, your baby is about 9 inches long and weighs 1 1/2 pounds. You will begin to feel the baby move between 18 and 20 weeks of the pregnancy. This is called quickening. Weight gain is faster. A clear fluid (colostrum) may leak out of your breasts. You may feel small contractions of the womb (uterus). This is known as false labor or Braxton-Hicks contractions. This is like a practice for labor when the baby is ready to be born. Usually, the problems with morning sickness have usually passed by the end of your first trimester. Some women develop small dark blotches (called cholasma, mask of pregnancy) on their face that usually goes away after the baby is born. Exposure to the sun makes the blotches worse. Acne may also develop in some pregnant women and pregnant women who have acne, may find that it goes away. PRENATAL EXAMS  Blood work may continue to be done during prenatal exams. These tests are done to check on your health and the probable health of your baby. Blood work is used to follow your blood levels (hemoglobin). Anemia (low hemoglobin) is common during pregnancy. Iron and vitamins are given to help prevent this. You will also be checked for diabetes between 24 and 28 weeks of the pregnancy. Some of the previous blood tests may be repeated.   The size of the uterus is measured during each visit. This is to make sure that the baby is continuing to grow properly according to the dates of the pregnancy.   Your blood pressure is checked every prenatal visit. This is to make sure you are not getting toxemia.   Your urine is checked to make sure you do not have an infection, diabetes or protein in the urine.   Your weight is checked often to make sure gains are happening at the suggested rate. This is to ensure that both you and your baby are  growing normally.   Sometimes, an ultrasound is performed to confirm the proper growth and development of the baby. This is a test which bounces harmless sound waves off the baby so your caregiver can more accurately determine due dates.  Sometimes, a specialized test is done on the amniotic fluid surrounding the baby. This test is called an amniocentesis. The amniotic fluid is obtained by sticking a needle into the belly (abdomen). This is done to check the chromosomes in instances where there is a concern about possible genetic problems with the baby. It is also sometimes done near the end of pregnancy if an early delivery is required. In this case, it is done to help make sure the baby's lungs are mature enough for the baby to live outside of the womb. CHANGES OCCURING IN THE SECOND TRIMESTER OF PREGNANCY Your body goes through many changes during pregnancy. They vary from person to person. Talk to your caregiver about changes you notice that you are concerned about.  During the second trimester, you will likely have an increase in your appetite. It is normal to have cravings for certain foods. This varies from person to person and pregnancy to pregnancy.   Your lower abdomen will begin to bulge.   You may have to urinate more often because the uterus and baby are pressing on your bladder. It is also common to get more bladder infections during pregnancy (  pain with urination). You can help this by drinking lots of fluids and emptying your bladder before and after intercourse.   You may begin to get stretch marks on your hips, abdomen, and breasts. These are normal changes in the body during pregnancy. There are no exercises or medications to take that prevent this change.   You may begin to develop swollen and bulging veins (varicose veins) in your legs. Wearing support hose, elevating your feet for 15 minutes, 3 to 4 times a day and limiting salt in your diet helps lessen the problem.    Heartburn may develop as the uterus grows and pushes up against the stomach. Antacids recommended by your caregiver helps with this problem. Also, eating smaller meals 4 to 5 times a day helps.   Constipation can be treated with a stool softener or adding bulk to your diet. Drinking lots of fluids, vegetables, fruits, and whole grains are helpful.   Exercising is also helpful. If you have been very active up until your pregnancy, most of these activities can be continued during your pregnancy. If you have been less active, it is helpful to start an exercise program such as walking.   Hemorrhoids (varicose veins in the rectum) may develop at the end of the second trimester. Warm sitz baths and hemorrhoid cream recommended by your caregiver helps hemorrhoid problems.   Backaches may develop during this time of your pregnancy. Avoid heavy lifting, wear low heal shoes and practice good posture to help with backache problems.   Some pregnant women develop tingling and numbness of their hand and fingers because of swelling and tightening of ligaments in the wrist (carpel tunnel syndrome). This goes away after the baby is born.   As your breasts enlarge, you may have to get a bigger bra. Get a comfortable, cotton, support bra. Do not get a nursing bra until the last month of the pregnancy if you will be nursing the baby.   You may get a dark line from your belly button to the pubic area called the linea nigra.   You may develop rosy cheeks because of increase blood flow to the face.   You may develop spider looking lines of the face, neck, arms and chest. These go away after the baby is born.  HOME CARE INSTRUCTIONS   It is extremely important to avoid all smoking, herbs, alcohol, and unprescribed drugs during your pregnancy. These chemicals affect the formation and growth of the baby. Avoid these chemicals throughout the pregnancy to ensure the delivery of a healthy infant.   Most of your home  care instructions are the same as suggested for the first trimester of your pregnancy. Keep your caregiver's appointments. Follow your caregiver's instructions regarding medication use, exercise and diet.   During pregnancy, you are providing food for you and your baby. Continue to eat regular, well-balanced meals. Choose foods such as meat, fish, milk and other low fat dairy products, vegetables, fruits, and whole-grain breads and cereals. Your caregiver will tell you of the ideal weight gain.   A physical sexual relationship may be continued up until near the end of pregnancy if there are no other problems. Problems could include early (premature) leaking of amniotic fluid from the membranes, vaginal bleeding, abdominal pain, or other medical or pregnancy problems.   Exercise regularly if there are no restrictions. Check with your caregiver if you are unsure of the safety of some of your exercises. The greatest weight gain will occur in the   last 2 trimesters of pregnancy. Exercise will help you:   Control your weight.   Get you in shape for labor and delivery.   Lose weight after you have the baby.   Wear a good support or jogging bra for breast tenderness during pregnancy. This may help if worn during sleep. Pads or tissues may be used in the bra if you are leaking colostrum.   Do not use hot tubs, steam rooms or saunas throughout the pregnancy.   Wear your seat belt at all times when driving. This protects you and your baby if you are in an accident.   Avoid raw meat, uncooked cheese, cat litter boxes and soil used by cats. These carry germs that can cause birth defects in the baby.   The second trimester is also a good time to visit your dentist for your dental health if this has not been done yet. Getting your teeth cleaned is OK. Use a soft toothbrush. Brush gently during pregnancy.   It is easier to loose urine during pregnancy. Tightening up and strengthening the pelvic muscles will  help with this problem. Practice stopping your urination while you are going to the bathroom. These are the same muscles you need to strengthen. It is also the muscles you would use as if you were trying to stop from passing gas. You can practice tightening these muscles up 10 times a set and repeating this about 3 times per day. Once you know what muscles to tighten up, do not perform these exercises during urination. It is more likely to contribute to an infection by backing up the urine.   Ask for help if you have financial, counseling or nutritional needs during pregnancy. Your caregiver will be able to offer counseling for these needs as well as refer you for other special needs.   Your skin may become oily. If so, wash your face with mild soap, use non-greasy moisturizer and oil or cream based makeup.  MEDICATIONS AND DRUG USE IN PREGNANCY  Take prenatal vitamins as directed. The vitamin should contain 1 milligram of folic acid. Keep all vitamins out of reach of children. Only a couple vitamins or tablets containing iron may be fatal to a baby or young child when ingested.   Avoid use of all medications, including herbs, over-the-counter medications, not prescribed or suggested by your caregiver. Only take over-the-counter or prescription medicines for pain, discomfort, or fever as directed by your caregiver. Do not use aspirin.   Let your caregiver also know about herbs you may be using.   Alcohol is related to a number of birth defects. This includes fetal alcohol syndrome. All alcohol, in any form, should be avoided completely. Smoking will cause low birth rate and premature babies.   Street or illegal drugs are very harmful to the baby. They are absolutely forbidden. A baby born to an addicted mother will be addicted at birth. The baby will go through the same withdrawal an adult does.  SEEK MEDICAL CARE IF:  You have any concerns or worries during your pregnancy. It is better to call with  your questions if you feel they cannot wait, rather than worry about them. SEEK IMMEDIATE MEDICAL CARE IF:   An unexplained oral temperature above 102 F (38.9 C) develops, or as your caregiver suggests.   You have leaking of fluid from the vagina (birth canal). If leaking membranes are suspected, take your temperature and tell your caregiver of this when you call.   There   is vaginal spotting, bleeding, or passing clots. Tell your caregiver of the amount and how many pads are used. Light spotting in pregnancy is common, especially following intercourse.   You develop a bad smelling vaginal discharge with a change in the color from clear to white.   You continue to feel sick to your stomach (nauseated) and have no relief from remedies suggested. You vomit blood or coffee ground-like materials.   You lose more than 2 pounds of weight or gain more than 2 pounds of weight over 1 week, or as suggested by your caregiver.   You notice swelling of your face, hands, feet, or legs.   You get exposed to German measles and have never had them.   You are exposed to fifth disease or chickenpox.   You develop belly (abdominal) pain. Round ligament discomfort is a common non-cancerous (benign) cause of abdominal pain in pregnancy. Your caregiver still must evaluate you.   You develop a bad headache that does not go away.   You develop fever, diarrhea, pain with urination, or shortness of breath.   You develop visual problems, blurry, or double vision.   You fall or are in a car accident or any kind of trauma.   There is mental or physical violence at home.  Document Released: 10/10/2001 Document Revised: 10/05/2011 Document Reviewed: 04/14/2009 [employer] Patient Information 2012 [employer]. 

## 2012-08-02 IMAGING — US US OB FOLLOW-UP
1 series · 13 of 28 positions shown · non-contrast
Comparison: none

[Series 1: us ob follow up · 32 acquisitions, 13 frames shown]
[im 2/32]
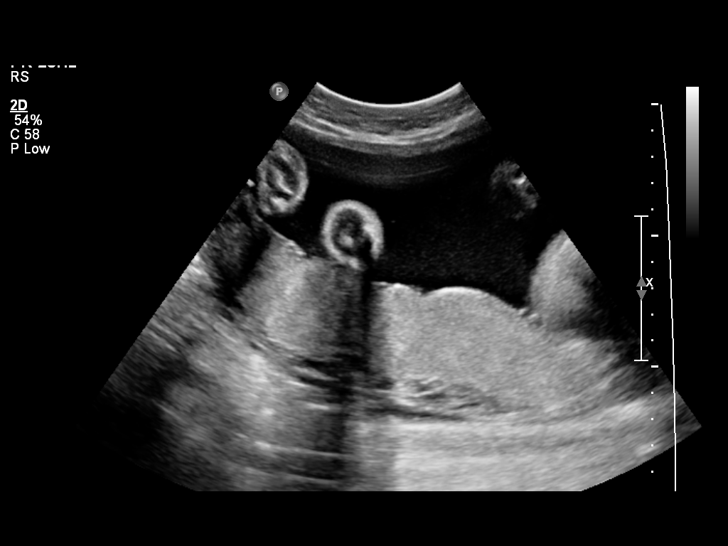
[im 4/32]
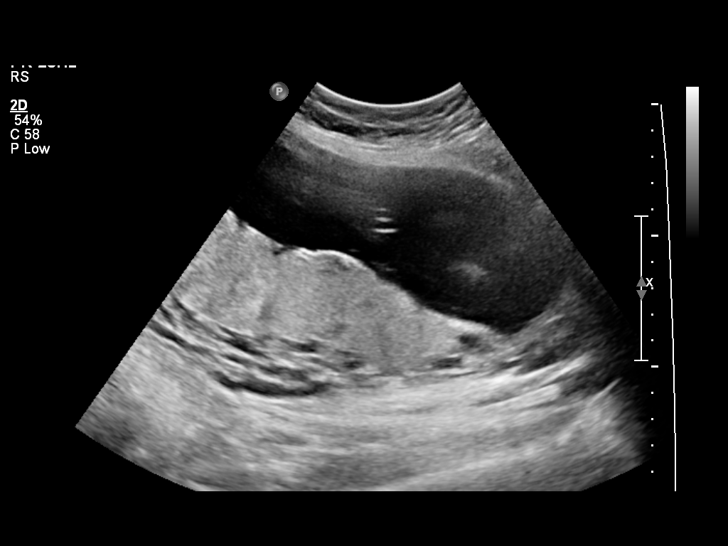
[im 6/32]
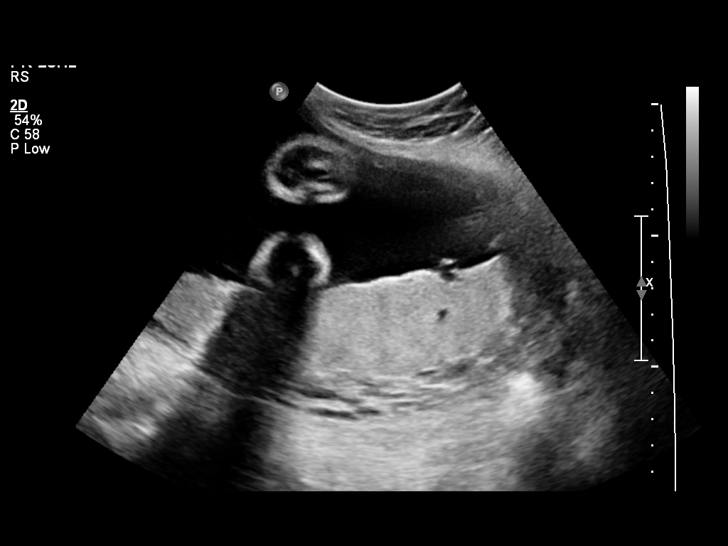
[im 9/32]
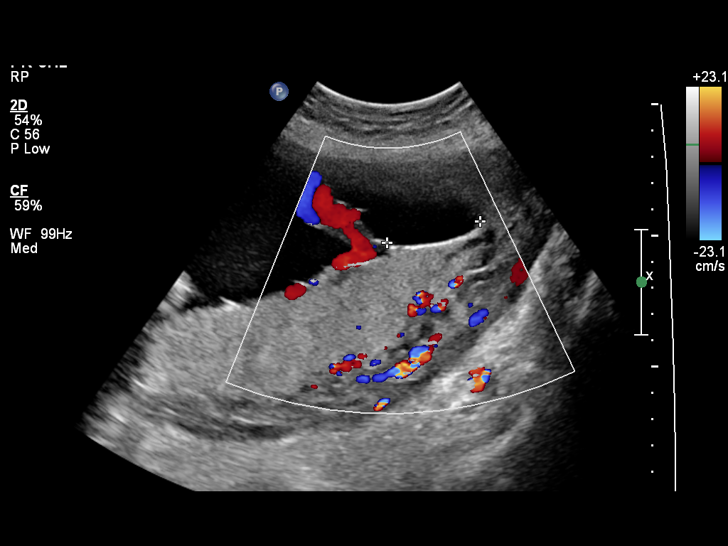
[im 11/32]
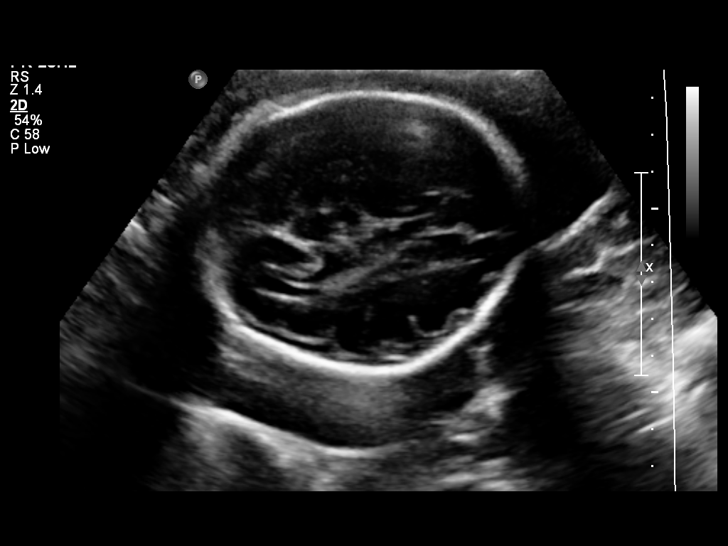
[im 13/32]
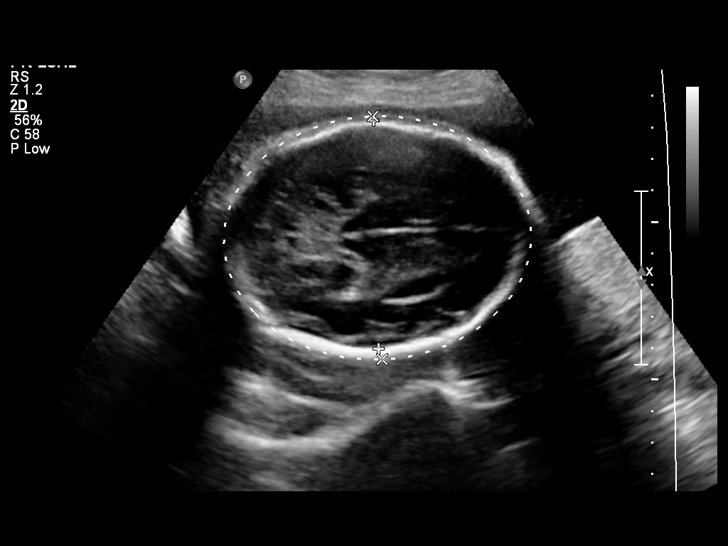
[im 17/32]
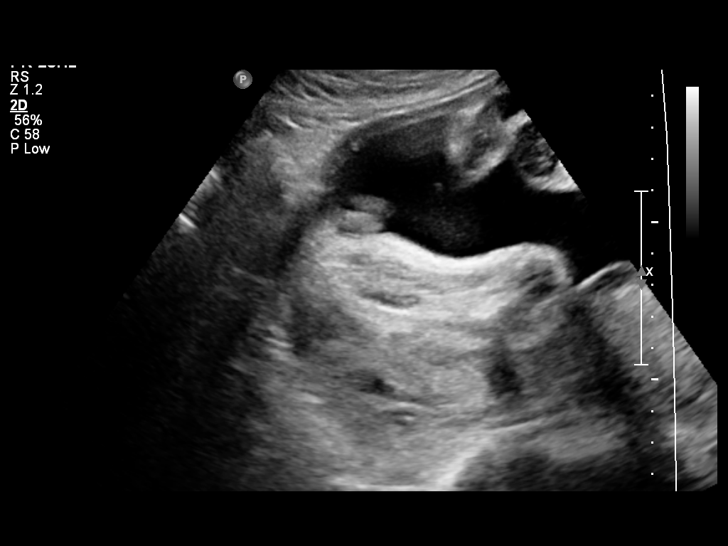
[im 19/32]
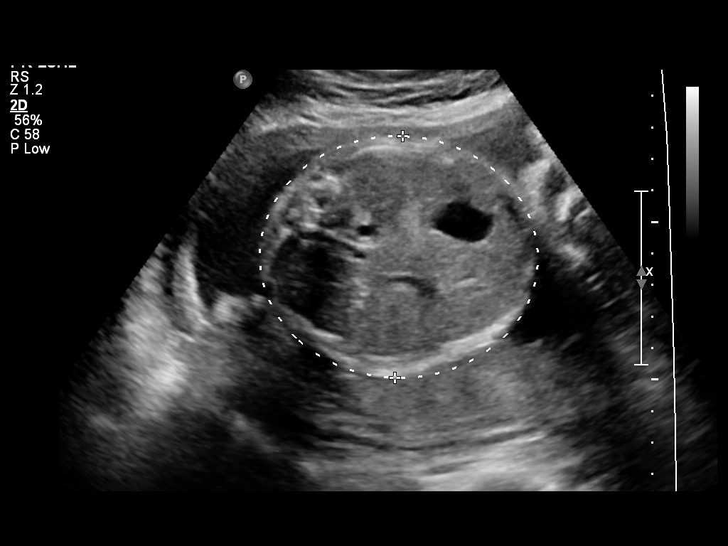
[im 21/32]
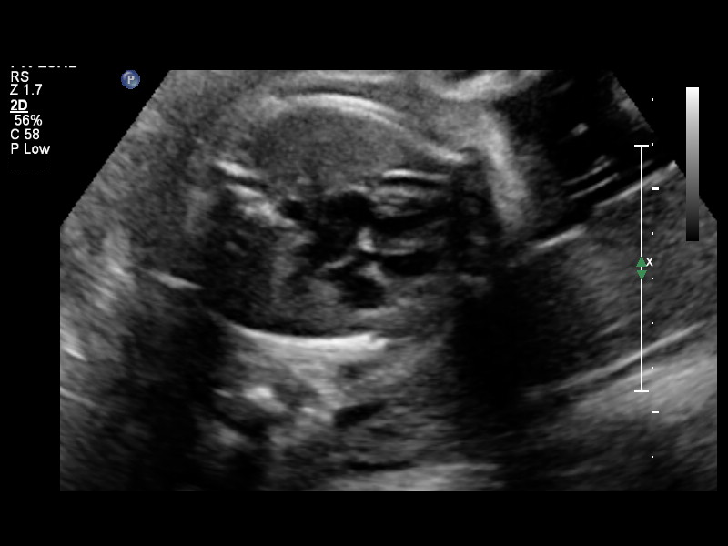
[im 23/32]
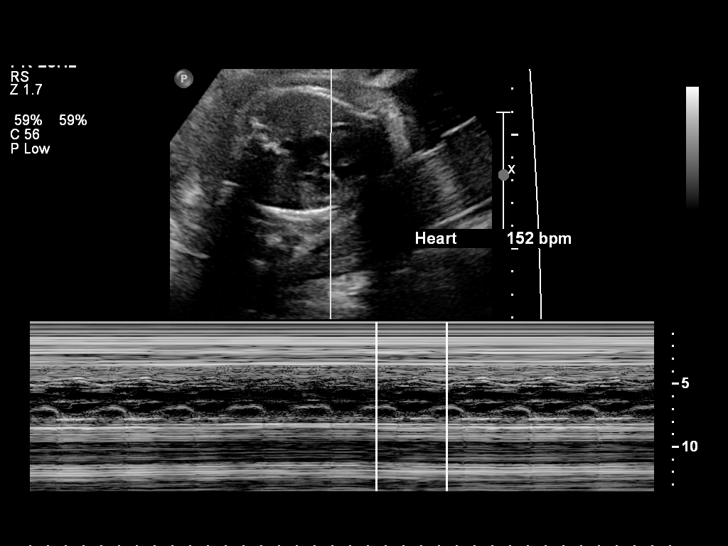
[im 26/32]
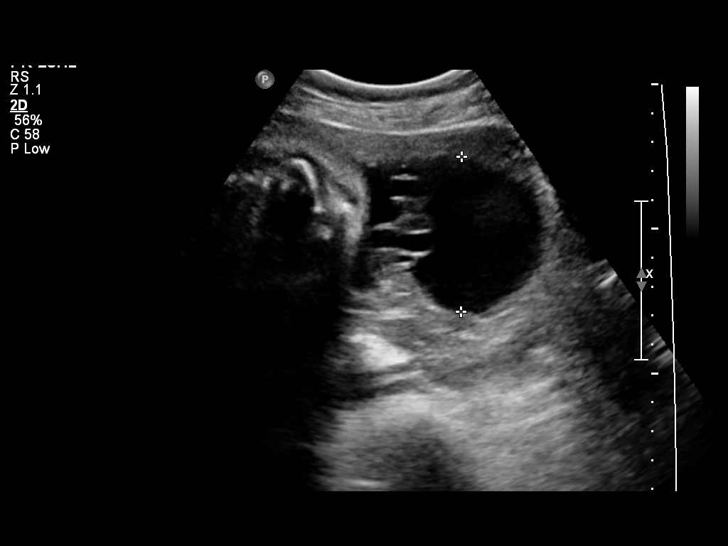
[im 28/32]
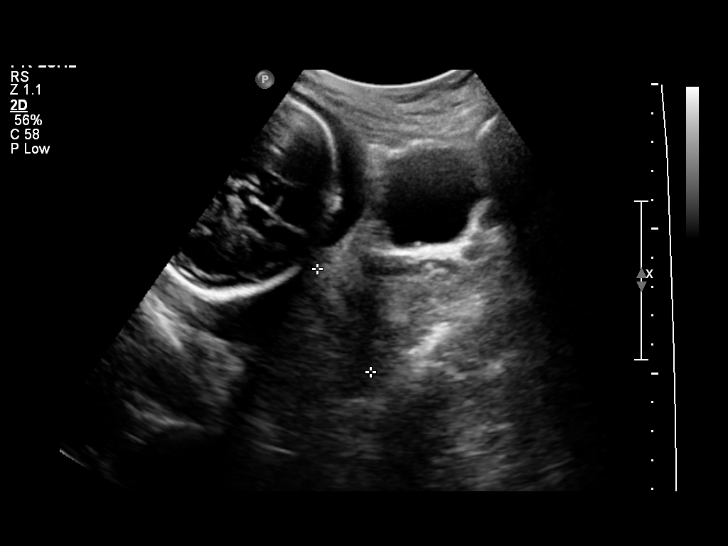
[im 30/32]
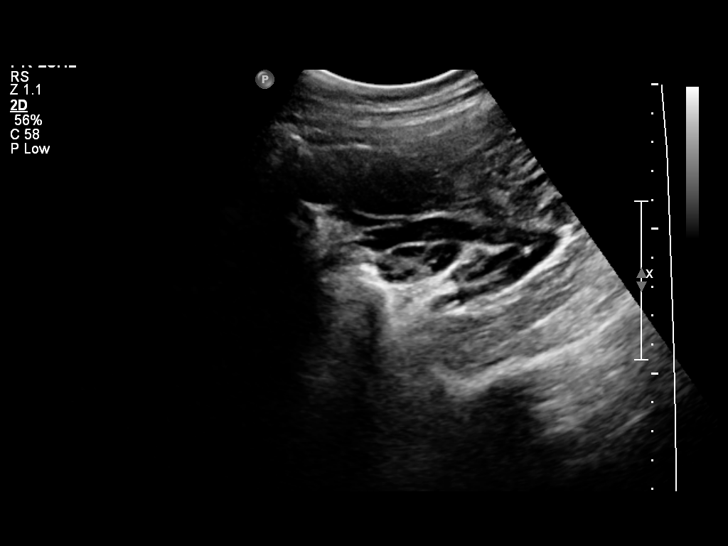

[13 of 28 positions shown; findings below may reference images not displayed]

OBSTETRICS REPORT
                      (Signed Final 07/04/2011 [DATE])

 Order#:         [id number]
Procedures

 US OB FOLLOW UP                                       76816.1
Indications

 Assess Fetal Growth / Estimated Fetal Weight
 Marginal cord insertion
Fetal Evaluation

 Fetal Heart Rate:  152                          bpm
 Cardiac Activity:  Observed
 Presentation:      Cephalic
 Placenta:          Anterior, above cervical os
 P. Cord            Appears WNL
 Insertion:

 Comment:    Placental cord insertion is 3.6 cm from edge.

 Amniotic Fluid
 AFI FV:      Subjectively within normal limits
 AFI Sum:     17.9    cm       68  %Tile     Larg Pckt:    5.35  cm
 RUQ:   3.8     cm   RLQ:    4.51   cm    LUQ:   4.24    cm   LLQ:    5.35   cm
Biometry

 BPD:     72.2  mm     G. Age:  29w 0d                CI:        69.95   70 - 86
                                                      FL/HC:      21.1   18.8 -

 HC:     275.4  mm     G. Age:  30w 1d       70  %    HC/AC:      1.06   1.05 -

 AC:       259  mm     G. Age:  30w 1d       86  %    FL/BPD:     80.3   71 - 87
 FL:        58  mm     G. Age:  30w 2d       85  %    FL/AC:      22.4   20 - 24

 Est. FW:    2101  gm      3 lb 5 oz     79  %
Gestational Age

 Clinical EDD:  28w 3d                                        EDD:   09/23/11
 U/S Today:     29w 6d                                        EDD:   09/13/11
 Best:          28w 3d     Det. By:  Clinical EDD             EDD:   09/23/11
Anatomy
 Cranium:           Appears normal      Aortic Arch:       Previously seen
 Fetal Cavum:       Appears normal      Ductal Arch:       Previously seen
 Ventricles:        Appears normal      Diaphragm:         Previously seen
 Choroid Plexus:    Previously seen     Stomach:           Appears normal
 Cerebellum:        Previously seen     Abdomen:           Appears normal
 Posterior Fossa:   Previously seen     Abdominal Wall:    Previously seen
 Nuchal Fold:       Previously seen     Cord Vessels:      Previously seen
 Face:              Previously seen     Kidneys:           Appear normal
 Heart:             Appears normal      Bladder:           Appears normal
                    (4 chamber &
                    axis)
 RVOT:              Previously seen     Spine:             Previously seen
 LVOT:              Previously seen     Limbs:             Previously seen

 Other:     Fetus appears to be a female. Heels and 5th digit
            previously seen.
Cervix Uterus Adnexa

 Cervical Length:    4        cm

 Cervix:       Normal appearance by transabdominal scan.
Impression

   Single living intrauterine gestation in cephalic presentation
 with concordant gestational age and fetal indices.  Normal
 growth and fluid.

 questions or concerns.

## 2012-08-19 ENCOUNTER — Ambulatory Visit (INDEPENDENT_AMBULATORY_CARE_PROVIDER_SITE_OTHER): Payer: BC Managed Care – PPO | Admitting: Obstetrics and Gynecology

## 2012-08-19 ENCOUNTER — Encounter: Payer: Self-pay | Admitting: Obstetrics and Gynecology

## 2012-08-19 VITALS — BP 105/73 | Temp 97.3°F | Wt 147.0 lb

## 2012-08-19 DIAGNOSIS — Z3480 Encounter for supervision of other normal pregnancy, unspecified trimester: Secondary | ICD-10-CM

## 2012-08-19 DIAGNOSIS — O09899 Supervision of other high risk pregnancies, unspecified trimester: Secondary | ICD-10-CM | POA: Insufficient documentation

## 2012-08-19 NOTE — Progress Notes (Signed)
p-103  Headaches daily

## 2012-08-19 NOTE — Progress Notes (Signed)
Doing well. Lactating still. Discussed weight gain, diet and fluid intake. Plans another TOLAC and waterbirth. Anatomy [place] scheduled.

## 2012-08-19 NOTE — Patient Instructions (Signed)
Vaginal Birth After Cesarean Delivery  Vaginal birth after Cesarean delivery (VBAC) is giving birth vaginally after previously delivering a baby by a cesarean. In the past, if a woman had a Cesarean delivery, all births afterwards would be done by Cesarean delivery. This is no longer true. It can be safe for the mother to try a vaginal delivery after having a Cesarean. The final decision to have a VBAC or repeat Cesarean delivery should be between the patient and her caregiver. The risks and benefits can be discussed relative to the reason for, and the type of the previous Cesarean delivery.  WOMEN WHO PLAN TO HAVE A VBAC SHOULD CHECK WITH THEIR DOCTOR TO BE SURE THAT:  · The previous Cesarean was done with a low transverse uterine incision (not a vertical classical incision).  · The birth canal is big enough for the baby.  · There were no other operations on the uterus.  · They will have an electronic fetal monitor (EFM) on at all times during labor.  · An operating room would be available and ready in case an emergency Cesarean is needed.  · A doctor and surgical nursing staff would be available at all times during labor to be ready to do an emergency Cesarean if necessary.  · An anesthesiologist would be present in case an emergency Cesarean is needed.  · The nursery is prepared and has adequate personnel and necessary equipment available to care for the baby in case of an emergency Cesarean.  BENEFITS OF VBAC:  · Shorter stay in the hospital.  · Lower delivery, nursery and hospital costs.  · Less blood loss and need for blood transfusions.  · Less fever and discomfort from major surgery.  · Lower risk of blood clots.  · Lower risk of infection.  · Shorter recovery after going home.  · Lower risk of other surgical complications, such as opening of the incision or hernia in the incision.  · Decreased risk of injury to other organs.  · Decreased risk for having to remove the uterus (hysterectomy).  · Decreased risk  for the placenta to completely or partially cover the opening of the uterus (placenta previa) with a future pregnancy.  · Ability to have a larger family if desired.  RISKS OF A VBAC:  · Rupture of the uterus.  · Having to remove the uterus (hysterectomy) if it ruptures.  · All the complications of major surgery and/or injury to other organs.  · Excessive bleeding, blood clots and infection.  · Lower Apgar scores (method to evaluate the newborn based on appearance, pulse, grimace, activity, and respiration) and more risks to the baby.  · There is a higher risk of uterine rupture if you induce or augment labor.  · There is a higher risk of uterine rupture if you use medications to ripen the cervix.  VBAC SHOULD NOT BE DONE IF:  · The previous Cesarean was done with a vertical (classical) or T-shaped incision, or you do not know what kind of an incision was made.  · You had a ruptured uterus.  · You had surgery on your uterus.  · You have medical or obstetrical problems.  · There are problems with the baby.  · There were two previous Cesarean deliveries and no vaginal deliveries.  OTHER FACTS TO KNOW ABOUT VBAC:  · It is safe to have an epidural anesthetic with VBAC.  · It is safe to turn the baby from a breech position (attempt an external   cephalic version).  · It is safe to try a VBAC with twins.  · Pregnancies later than 40 weeks have not been successful with VBAC.  · There is an increased failure rate of a VBAC in obese pregnant women.  · There is an increased failure rate with VABC if the baby weighs 8.8 pounds (4000 grams) or more.  · There is an increased failure rate if the time between the Cesarean and VBAC is less than 19 months.  · There is an increased failure rate if pre-eclampsia is present (high blood pressure, protein in the urine and swelling of face and extremities).  · VBAC is very successful if there was a previous vaginal birth.  · VBAC is very successful when the labor starts spontaneously before  the due date.  · Delivery of VBAC is similar to having a normal spontaneous vaginal delivery.  It is important to discuss VBAC with your caregiver early in the pregnancy so you can understand the risks, benefits and options. It will give you time to decide what is best in your particular case relevant to the reason for your previous Cesarean delivery. It should be understood that medical changes in the mother or pregnancy may occur during the pregnancy, which make it necessary to change you or your caregiver's initial decision. The counseling, concerns and decisions should be documented in the medical record and signed by all parties.  Document Released: 04/08/2007 Document Revised: 01/08/2012 Document Reviewed: 11/27/2008  ExitCare® Patient Information ©2013 [employer].

## 2012-09-02 ENCOUNTER — Ambulatory Visit (HOSPITAL_COMMUNITY)
Admission: RE | Admit: 2012-09-02 | Discharge: 2012-09-02 | Disposition: A | Discharge status: Home / Self Care | Payer: BC Managed Care – PPO | Source: Ambulatory Visit | Attending: Obstetrics and Gynecology | Admitting: Obstetrics and Gynecology

## 2012-09-02 DIAGNOSIS — Z3689 Encounter for other specified antenatal screening: Secondary | ICD-10-CM | POA: Insufficient documentation

## 2012-09-02 DIAGNOSIS — O34219 Maternal care for unspecified type scar from previous cesarean delivery: Secondary | ICD-10-CM | POA: Insufficient documentation

## 2012-09-02 DIAGNOSIS — O09899 Supervision of other high risk pregnancies, unspecified trimester: Secondary | ICD-10-CM

## 2012-09-16 ENCOUNTER — Ambulatory Visit (INDEPENDENT_AMBULATORY_CARE_PROVIDER_SITE_OTHER): Payer: BC Managed Care – PPO | Admitting: Advanced Practice Midwife

## 2012-09-16 DIAGNOSIS — Z3480 Encounter for supervision of other normal pregnancy, unspecified trimester: Secondary | ICD-10-CM

## 2012-09-16 NOTE — Progress Notes (Signed)
p-81  C/o vaginal pain

## 2012-09-16 NOTE — Progress Notes (Signed)
Doing well.  Feeling fetal movement, denies vaginal bleeding, LOF, regular contractions.  Reports sharp vaginal pain, constant but increasing a few times per day.  No s/sx of vaginal infection, cervix 0/th/high today.  Discussed fetal position of transverse on U/S, possibility of fetal movement/rotation causing discomfort.  Encouraged maternal positions to improve discomfort/change fetal position including hands and knees and sidelying.

## 2012-09-17 ENCOUNTER — Encounter: Payer: Self-pay | Admitting: Obstetrics & Gynecology

## 2012-10-14 ENCOUNTER — Ambulatory Visit (INDEPENDENT_AMBULATORY_CARE_PROVIDER_SITE_OTHER): Payer: BC Managed Care – PPO | Admitting: Advanced Practice Midwife

## 2012-10-14 VITALS — BP 104/70 | Wt 158.0 lb

## 2012-10-14 DIAGNOSIS — Z3480 Encounter for supervision of other normal pregnancy, unspecified trimester: Secondary | ICD-10-CM

## 2012-10-14 NOTE — Progress Notes (Signed)
Doing well.  Feeling fetal movement but less this pregnancy r/t anterior placenta, denies vaginal bleeding, LOF, regular contractions.  Still having sharp vaginal pain, but less than at last visit.  Seeing chiropractor, and this is helping.  Problems finding comfortable sleeping position as sidelying causes more vaginal pain.  Discussed pillow options for improved sleep including wedge pillow for incline position and body pillow for sidelying.  Busy and not eating enough calories.  Still breastfeeding previous child and milk supply decreased.  Discussed liquid nutrition options, protein/high fiber bars, and snacks to get in calories.

## 2012-10-14 NOTE — Progress Notes (Signed)
p=91 

## 2012-10-30 NOTE — L&D Delivery Note (Signed)
Vanessa Larson is a 32 y.o. [id number] at [redacted]w[redacted]d presenting in active labor. Her membranes ruptured spontaneously and she became completely dilated shortly after that.  She went on to have a successful second VBAC water birth.  Delivery Note At 6:21 AM a viable female was delivered via Vaginal, Spontaneous Delivery (Presentation: Left Occiput Anterior).  APGAR: 8, 9; weight pending.   Placenta status: Intact, Spontaneous.  Cord: 3 vessels with the following complications: None.  Cord pH: n/a  Anesthesia: None  Episiotomy: None Lacerations: 2nd degree Suture Repair: 3.0 vicryl rapide Est. Blood Loss (mL): 350  Mom to postpartum.  Baby to nursery-stable.  Napoleon Form 01/27/2013, 7:42 AM

## 2012-10-30 NOTE — L&D Delivery Note (Signed)
Agree with above note.  Careena Degraffenreid H. 01/31/2013 11:02 AM  

## 2012-11-11 ENCOUNTER — Ambulatory Visit (INDEPENDENT_AMBULATORY_CARE_PROVIDER_SITE_OTHER): Payer: BC Managed Care – PPO | Admitting: Advanced Practice Midwife

## 2012-11-11 ENCOUNTER — Encounter: Payer: Self-pay | Admitting: Advanced Practice Midwife

## 2012-11-11 VITALS — BP 105/62 | Wt 162.0 lb

## 2012-11-11 DIAGNOSIS — Z3480 Encounter for supervision of other normal pregnancy, unspecified trimester: Secondary | ICD-10-CM

## 2012-11-11 LAB — CBC
HCT: 33.1 % — ABNORMAL LOW (ref 36.0–46.0)
Hemoglobin: 11 g/dL — ABNORMAL LOW (ref 12.0–15.0)
RBC: 3.9 MIL/uL (ref 3.87–5.11)
RDW: 14.6 % (ref 11.5–15.5)
WBC: 7.6 10*3/uL (ref 4.0–10.5)

## 2012-11-11 NOTE — Progress Notes (Signed)
p-84 

## 2012-11-11 NOTE — Patient Instructions (Signed)
Pregnancy - Third Trimester  The third trimester of pregnancy (the last 3 months) is a period of the most rapid growth for you and your baby. The baby approaches a length of 20 inches and a weight of 6 to 10 pounds. The baby is adding on fat and getting ready for life outside your body. While inside, babies have periods of sleeping and waking, suck their thumbs, and hiccups. You can often feel small contractions of the uterus. This is false labor. It is also called Braxton-Hicks contractions. This is like a practice for labor. The usual problems in this stage of pregnancy include more difficulty breathing, swelling of the hands and feet from water retention, and having to urinate more often because of the uterus and baby pressing on your bladder.   PRENATAL EXAMS  · Blood work may continue to be done during prenatal exams. These tests are done to check on your health and the probable health of your baby. Blood work is used to follow your blood levels (hemoglobin). Anemia (low hemoglobin) is common during pregnancy. Iron and vitamins are given to help prevent this. You may also continue to be checked for diabetes. Some of the past blood tests may be done again.  · The size of the uterus is measured during each visit. This makes sure your baby is growing properly according to your pregnancy dates.  · Your blood pressure is checked every prenatal visit. This is to make sure you are not getting toxemia.  · Your urine is checked every prenatal visit for infection, diabetes and protein.  · Your weight is checked at each visit. This is done to make sure gains are happening at the suggested rate and that you and your baby are growing normally.  · Sometimes, an ultrasound is performed to confirm the position and the proper growth and development of the baby. This is a test done that bounces harmless sound waves off the baby so your caregiver can more accurately determine due dates.  · Discuss the type of pain medication and  anesthesia you will have during your labor and delivery.  · Discuss the possibility and anesthesia if a Cesarean Section might be necessary.  · Inform your caregiver if there is any mental or physical violence at home.  Sometimes, a specialized non-stress test, contraction stress test and biophysical profile are done to make sure the baby is not having a problem. Checking the amniotic fluid surrounding the baby is called an amniocentesis. The amniotic fluid is removed by sticking a needle into the belly (abdomen). This is sometimes done near the end of pregnancy if an early delivery is required. In this case, it is done to help make sure the baby's lungs are mature enough for the baby to live outside of the womb. If the lungs are not mature and it is unsafe to deliver the baby, an injection of cortisone medication is given to the mother 1 to 2 days before the delivery. This helps the baby's lungs mature and makes it safer to deliver the baby.  CHANGES OCCURING IN THE THIRD TRIMESTER OF PREGNANCY  Your body goes through many changes during pregnancy. They vary from person to person. Talk to your caregiver about changes you notice and are concerned about.  · During the last trimester, you have probably had an increase in your appetite. It is normal to have cravings for certain foods. This varies from person to person and pregnancy to pregnancy.  · You may begin to   get stretch marks on your hips, abdomen, and breasts. These are normal changes in the body during pregnancy. There are no exercises or medications to take which prevent this change.  · Constipation may be treated with a stool softener or adding bulk to your diet. Drinking lots of fluids, fiber in vegetables, fruits, and whole grains are helpful.  · Exercising is also helpful. If you have been very active up until your pregnancy, most of these activities can be continued during your pregnancy. If you have been less active, it is helpful to start an exercise  program such as walking. Consult your caregiver before starting exercise programs.  · Avoid all smoking, alcohol, un-prescribed drugs, herbs and "street drugs" during your pregnancy. These chemicals affect the formation and growth of the baby. Avoid chemicals throughout the pregnancy to ensure the delivery of a healthy infant.  · Backache, varicose veins and hemorrhoids may develop or get worse.  · You will tire more easily in the third trimester, which is normal.  · The baby's movements may be stronger and more often.  · You may become short of breath easily.  · Your belly button may stick out.  · A yellow discharge may leak from your breasts called colostrum.  · You may have a bloody mucus discharge. This usually occurs a few days to a week before labor begins.  HOME CARE INSTRUCTIONS   · Keep your caregiver's appointments. Follow your caregiver's instructions regarding medication use, exercise, and diet.  · During pregnancy, you are providing food for you and your baby. Continue to eat regular, well-balanced meals. Choose foods such as meat, fish, milk and other low fat dairy products, vegetables, fruits, and whole-grain breads and cereals. Your caregiver will tell you of the ideal weight gain.  · A physical sexual relationship may be continued throughout pregnancy if there are no other problems such as early (premature) leaking of amniotic fluid from the membranes, vaginal bleeding, or belly (abdominal) pain.  · Exercise regularly if there are no restrictions. Check with your caregiver if you are unsure of the safety of your exercises. Greater weight gain will occur in the last 2 trimesters of pregnancy. Exercising helps:  · Control your weight.  · Get you in shape for labor and delivery.  · You lose weight after you deliver.  · Rest a lot with legs elevated, or as needed for leg cramps or low back pain.  · Wear a good support or jogging bra for breast tenderness during pregnancy. This may help if worn during  sleep. Pads or tissues may be used in the bra if you are leaking colostrum.  · Do not use hot tubs, steam rooms, or saunas.  · Wear your seat belt when driving. This protects you and your baby if you are in an accident.  · Avoid raw meat, cat litter boxes and soil used by cats. These carry germs that can cause birth defects in the baby.  · It is easier to loose urine during pregnancy. Tightening up and strengthening the pelvic muscles will help with this problem. You can practice stopping your urination while you are going to the bathroom. These are the same muscles you need to strengthen. It is also the muscles you would use if you were trying to stop from passing gas. You can practice tightening these muscles up 10 times a set and repeating this about 3 times per day. Once you know what muscles to tighten up, do not perform these   exercises during urination. It is more likely to cause an infection by backing up the urine.  · Ask for help if you have financial, counseling or nutritional needs during pregnancy. Your caregiver will be able to offer counseling for these needs as well as refer you for other special needs.  · Make a list of emergency phone numbers and have them available.  · Plan on getting help from family or friends when you go home from the hospital.  · Make a trial run to the hospital.  · Take prenatal classes with the father to understand, practice and ask questions about the labor and delivery.  · Prepare the baby's room/nursery.  · Do not travel out of the city unless it is absolutely necessary and with the advice of your caregiver.  · Wear only low or no heal shoes to have better balance and prevent falling.  MEDICATIONS AND DRUG USE IN PREGNANCY  · Take prenatal vitamins as directed. The vitamin should contain 1 milligram of folic acid. Keep all vitamins out of reach of children. Only a couple vitamins or tablets containing iron may be fatal to a baby or young child when ingested.  · Avoid use  of all medications, including herbs, over-the-counter medications, not prescribed or suggested by your caregiver. Only take over-the-counter or prescription medicines for pain, discomfort, or fever as directed by your caregiver. Do not use aspirin, ibuprofen (Motrin®, Advil®, Nuprin®) or naproxen (Aleve®) unless OK'd by your caregiver.  · Let your caregiver also know about herbs you may be using.  · Alcohol is related to a number of birth defects. This includes fetal alcohol syndrome. All alcohol, in any form, should be avoided completely. Smoking will cause low birth rate and premature babies.  · Street/illegal drugs are very harmful to the baby. They are absolutely forbidden. A baby born to an addicted mother will be addicted at birth. The baby will go through the same withdrawal an adult does.  SEEK MEDICAL CARE IF:  You have any concerns or worries during your pregnancy. It is better to call with your questions if you feel they cannot wait, rather than worry about them.  DECISIONS ABOUT CIRCUMCISION  You may or may not know the sex of your baby. If you know your baby is a boy, it may be time to think about circumcision. Circumcision is the removal of the foreskin of the penis. This is the skin that covers the sensitive end of the penis. There is no proven medical need for this. Often this decision is made on what is popular at the time or based upon religious beliefs and social issues. You can discuss these issues with your caregiver or pediatrician.  SEEK IMMEDIATE MEDICAL CARE IF:   · An unexplained oral temperature above 102° F (38.9° C) develops, or as your caregiver suggests.  · You have leaking of fluid from the vagina (birth canal). If leaking membranes are suspected, take your temperature and tell your caregiver of this when you call.  · There is vaginal spotting, bleeding or passing clots. Tell your caregiver of the amount and how many pads are used.  · You develop a bad smelling vaginal discharge with  a change in the color from clear to white.  · You develop vomiting that lasts more than 24 hours.  · You develop chills or fever.  · You develop shortness of breath.  · You develop burning on urination.  · You loose more than 2 pounds of weight   or gain more than 2 pounds of weight or as suggested by your caregiver.  · You notice sudden swelling of your face, hands, and feet or legs.  · You develop belly (abdominal) pain. Round ligament discomfort is a common non-cancerous (benign) cause of abdominal pain in pregnancy. Your caregiver still must evaluate you.  · You develop a severe headache that does not go away.  · You develop visual problems, blurred or double vision.  · If you have not felt your baby move for more than 1 hour. If you think the baby is not moving as much as usual, eat something with sugar in it and lie down on your left side for an hour. The baby should move at least 4 to 5 times per hour. Call right away if your baby moves less than that.  · You fall, are in a car accident or any kind of trauma.  · There is mental or physical violence at home.  Document Released: 10/10/2001 Document Revised: 01/08/2012 Document Reviewed: 04/14/2009  [employer]® Patient Information ©2013 [employer].

## 2012-11-11 NOTE — Progress Notes (Signed)
Well, feeling more fetal movement. 1 hour GCT today. Rev'd preterm labor and fetal movement precautions. F/U in 2 weeks.

## 2012-11-12 ENCOUNTER — Encounter: Payer: Self-pay | Admitting: Obstetrics & Gynecology

## 2012-11-12 LAB — GLUCOSE TOLERANCE, 1 HOUR (50G) W/O FASTING: Glucose, 1 Hour GTT: 100 mg/dL (ref 70–140)

## 2012-11-12 LAB — HIV ANTIBODY (ROUTINE TESTING W REFLEX): HIV: NONREACTIVE

## 2012-11-29 ENCOUNTER — Ambulatory Visit (INDEPENDENT_AMBULATORY_CARE_PROVIDER_SITE_OTHER): Payer: BC Managed Care – PPO | Admitting: Advanced Practice Midwife

## 2012-11-29 VITALS — BP 110/67 | Wt 164.0 lb

## 2012-11-29 DIAGNOSIS — Z3480 Encounter for supervision of other normal pregnancy, unspecified trimester: Secondary | ICD-10-CM

## 2012-11-29 NOTE — Progress Notes (Signed)
Reviewed 28 week labs. Discussed uncertain.availablity of FP waterbirth tub.

## 2012-11-29 NOTE — Patient Instructions (Signed)
Pregnancy - Second Trimester The second trimester of pregnancy (3 to 6 months) is a period of rapid growth for you and your baby. At the end of the sixth month, your baby is about 9 inches long and weighs 1 1/2 pounds. You will begin to feel the baby move between 18 and 20 weeks of the pregnancy. This is called quickening. Weight gain is faster. A clear fluid (colostrum) may leak out of your breasts. You may feel small contractions of the womb (uterus). This is known as false labor or Braxton-Hicks contractions. This is like a practice for labor when the baby is ready to be born. Usually, the problems with morning sickness have usually passed by the end of your first trimester. Some women develop small dark blotches (called cholasma, mask of pregnancy) on their face that usually goes away after the baby is born. Exposure to the sun makes the blotches worse. Acne may also develop in some pregnant women and pregnant women who have acne, may find that it goes away. PRENATAL EXAMS  Blood work may continue to be done during prenatal exams. These tests are done to check on your health and the probable health of your baby. Blood work is used to follow your blood levels (hemoglobin). Anemia (low hemoglobin) is common during pregnancy. Iron and vitamins are given to help prevent this. You will also be checked for diabetes between 24 and 28 weeks of the pregnancy. Some of the previous blood tests may be repeated.  The size of the uterus is measured during each visit. This is to make sure that the baby is continuing to grow properly according to the dates of the pregnancy.  Your blood pressure is checked every prenatal visit. This is to make sure you are not getting toxemia.  Your urine is checked to make sure you do not have an infection, diabetes or protein in the urine.  Your weight is checked often to make sure gains are happening at the suggested rate. This is to ensure that both you and your baby are growing  normally.  Sometimes, an ultrasound is performed to confirm the proper growth and development of the baby. This is a test which bounces harmless sound waves off the baby so your caregiver can more accurately determine due dates. Sometimes, a specialized test is done on the amniotic fluid surrounding the baby. This test is called an amniocentesis. The amniotic fluid is obtained by sticking a needle into the belly (abdomen). This is done to check the chromosomes in instances where there is a concern about possible genetic problems with the baby. It is also sometimes done near the end of pregnancy if an early delivery is required. In this case, it is done to help make sure the baby's lungs are mature enough for the baby to live outside of the womb. CHANGES OCCURING IN THE SECOND TRIMESTER OF PREGNANCY Your body goes through many changes during pregnancy. They vary from person to person. Talk to your caregiver about changes you notice that you are concerned about.  During the second trimester, you will likely have an increase in your appetite. It is normal to have cravings for certain foods. This varies from person to person and pregnancy to pregnancy.  Your lower abdomen will begin to bulge.  You may have to urinate more often because the uterus and baby are pressing on your bladder. It is also common to get more bladder infections during pregnancy (pain with urination). You can help this by   drinking lots of fluids and emptying your bladder before and after intercourse.  You may begin to get stretch marks on your hips, abdomen, and breasts. These are normal changes in the body during pregnancy. There are no exercises or medications to take that prevent this change.  You may begin to develop swollen and bulging veins (varicose veins) in your legs. Wearing support hose, elevating your feet for 15 minutes, 3 to 4 times a day and limiting salt in your diet helps lessen the problem.  Heartburn may develop  as the uterus grows and pushes up against the stomach. Antacids recommended by your caregiver helps with this problem. Also, eating smaller meals 4 to 5 times a day helps.  Constipation can be treated with a stool softener or adding bulk to your diet. Drinking lots of fluids, vegetables, fruits, and whole grains are helpful.  Exercising is also helpful. If you have been very active up until your pregnancy, most of these activities can be continued during your pregnancy. If you have been less active, it is helpful to start an exercise program such as walking.  Hemorrhoids (varicose veins in the rectum) may develop at the end of the second trimester. Warm sitz baths and hemorrhoid cream recommended by your caregiver helps hemorrhoid problems.  Backaches may develop during this time of your pregnancy. Avoid heavy lifting, wear low heal shoes and practice good posture to help with backache problems.  Some pregnant women develop tingling and numbness of their hand and fingers because of swelling and tightening of ligaments in the wrist (carpel tunnel syndrome). This goes away after the baby is born.  As your breasts enlarge, you may have to get a bigger bra. Get a comfortable, cotton, support bra. Do not get a nursing bra until the last month of the pregnancy if you will be nursing the baby.  You may get a dark line from your belly button to the pubic area called the linea nigra.  You may develop rosy cheeks because of increase blood flow to the face.  You may develop spider looking lines of the face, neck, arms and chest. These go away after the baby is born. HOME CARE INSTRUCTIONS   It is extremely important to avoid all smoking, herbs, alcohol, and unprescribed drugs during your pregnancy. These chemicals affect the formation and growth of the baby. Avoid these chemicals throughout the pregnancy to ensure the delivery of a healthy infant.  Most of your home care instructions are the same as  suggested for the first trimester of your pregnancy. Keep your caregiver's appointments. Follow your caregiver's instructions regarding medication use, exercise and diet.  During pregnancy, you are providing food for you and your baby. Continue to eat regular, well-balanced meals. Choose foods such as meat, fish, milk and other low fat dairy products, vegetables, fruits, and whole-grain breads and cereals. Your caregiver will tell you of the ideal weight gain.  A physical sexual relationship may be continued up until near the end of pregnancy if there are no other problems. Problems could include early (premature) leaking of amniotic fluid from the membranes, vaginal bleeding, abdominal pain, or other medical or pregnancy problems.  Exercise regularly if there are no restrictions. Check with your caregiver if you are unsure of the safety of some of your exercises. The greatest weight gain will occur in the last 2 trimesters of pregnancy. Exercise will help you:  Control your weight.  Get you in shape for labor and delivery.  Lose weight   after you have the baby.  Wear a good support or jogging bra for breast tenderness during pregnancy. This may help if worn during sleep. Pads or tissues may be used in the bra if you are leaking colostrum.  Do not use hot tubs, steam rooms or saunas throughout the pregnancy.  Wear your seat belt at all times when driving. This protects you and your baby if you are in an accident.  Avoid raw meat, uncooked cheese, cat litter boxes and soil used by cats. These carry germs that can cause birth defects in the baby.  The second trimester is also a good time to visit your dentist for your dental health if this has not been done yet. Getting your teeth cleaned is OK. Use a soft toothbrush. Brush gently during pregnancy.  It is easier to loose urine during pregnancy. Tightening up and strengthening the pelvic muscles will help with this problem. Practice stopping your  urination while you are going to the bathroom. These are the same muscles you need to strengthen. It is also the muscles you would use as if you were trying to stop from passing gas. You can practice tightening these muscles up 10 times a set and repeating this about 3 times per day. Once you know what muscles to tighten up, do not perform these exercises during urination. It is more likely to contribute to an infection by backing up the urine.  Ask for help if you have financial, counseling or nutritional needs during pregnancy. Your caregiver will be able to offer counseling for these needs as well as refer you for other special needs.  Your skin may become oily. If so, wash your face with mild soap, use non-greasy moisturizer and oil or cream based makeup. MEDICATIONS AND DRUG USE IN PREGNANCY  Take prenatal vitamins as directed. The vitamin should contain 1 milligram of folic acid. Keep all vitamins out of reach of children. Only a couple vitamins or tablets containing iron may be fatal to a baby or young child when ingested.  Avoid use of all medications, including herbs, over-the-counter medications, not prescribed or suggested by your caregiver. Only take over-the-counter or prescription medicines for pain, discomfort, or fever as directed by your caregiver. Do not use aspirin.  Let your caregiver also know about herbs you may be using.  Alcohol is related to a number of birth defects. This includes fetal alcohol syndrome. All alcohol, in any form, should be avoided completely. Smoking will cause low birth rate and premature babies.  Street or illegal drugs are very harmful to the baby. They are absolutely forbidden. A baby born to an addicted mother will be addicted at birth. The baby will go through the same withdrawal an adult does. SEEK MEDICAL CARE IF:  You have any concerns or worries during your pregnancy. It is better to call with your questions if you feel they cannot wait, rather  than worry about them. SEEK IMMEDIATE MEDICAL CARE IF:   An unexplained oral temperature above 102 F (38.9 C) develops, or as your caregiver suggests.  You have leaking of fluid from the vagina (birth canal). If leaking membranes are suspected, take your temperature and tell your caregiver of this when you call.  There is vaginal spotting, bleeding, or passing clots. Tell your caregiver of the amount and how many pads are used. Light spotting in pregnancy is common, especially following intercourse.  You develop a bad smelling vaginal discharge with a change in the color from clear   to white.  You continue to feel sick to your stomach (nauseated) and have no relief from remedies suggested. You vomit blood or coffee ground-like materials.  You lose more than 2 pounds of weight or gain more than 2 pounds of weight over 1 week, or as suggested by your caregiver.  You notice swelling of your face, hands, feet, or legs.  You get exposed to German measles and have never had them.  You are exposed to fifth disease or chickenpox.  You develop belly (abdominal) pain. Round ligament discomfort is a common non-cancerous (benign) cause of abdominal pain in pregnancy. Your caregiver still must evaluate you.  You develop a bad headache that does not go away.  You develop fever, diarrhea, pain with urination, or shortness of breath.  You develop visual problems, blurry, or double vision.  You fall or are in a car accident or any kind of trauma.  There is mental or physical violence at home. Document Released: 10/10/2001 Document Revised: 01/08/2012 Document Reviewed: 04/14/2009 [employer] Patient Information 2013 [employer].  

## 2012-11-29 NOTE — Progress Notes (Signed)
P-95 

## 2012-12-16 ENCOUNTER — Ambulatory Visit (INDEPENDENT_AMBULATORY_CARE_PROVIDER_SITE_OTHER): Payer: BC Managed Care – PPO | Admitting: Advanced Practice Midwife

## 2012-12-16 VITALS — BP 105/62 | Wt 167.0 lb

## 2012-12-16 DIAGNOSIS — Z3480 Encounter for supervision of other normal pregnancy, unspecified trimester: Secondary | ICD-10-CM

## 2012-12-16 DIAGNOSIS — O26899 Other specified pregnancy related conditions, unspecified trimester: Secondary | ICD-10-CM

## 2012-12-16 DIAGNOSIS — O26813 Pregnancy related exhaustion and fatigue, third trimester: Secondary | ICD-10-CM

## 2012-12-16 NOTE — Progress Notes (Signed)
P- 86 - Pt says has had pressure and "twinging" feeling - Also states has fatigued more easily and become short of breath with and without exertion

## 2012-12-16 NOTE — Progress Notes (Signed)
Good fetal movement, denies vaginal bleeding, LOF, regular contractions.  Having some fatigue and SOB with little exertion, different that previous pregnancies.  Resolves with rest.  Discussed OTC iron replacement in addition to PNV over next couple of weeks, will discuss again at next visit.  Call for sooner appt if symptoms persist or worsen.

## 2013-01-06 ENCOUNTER — Encounter: Payer: Self-pay | Admitting: Obstetrics and Gynecology

## 2013-01-06 ENCOUNTER — Encounter: Payer: Self-pay | Admitting: *Deleted

## 2013-01-06 ENCOUNTER — Ambulatory Visit (INDEPENDENT_AMBULATORY_CARE_PROVIDER_SITE_OTHER): Payer: BC Managed Care – PPO | Admitting: Obstetrics and Gynecology

## 2013-01-06 VITALS — BP 103/69 | Wt 172.0 lb

## 2013-01-06 DIAGNOSIS — Z3480 Encounter for supervision of other normal pregnancy, unspecified trimester: Secondary | ICD-10-CM

## 2013-01-06 DIAGNOSIS — Z139 Encounter for screening, unspecified: Secondary | ICD-10-CM

## 2013-01-06 NOTE — Progress Notes (Signed)
P-101 

## 2013-01-06 NOTE — Progress Notes (Signed)
VBAC consent done. Discussed water birth plan. Has liner and tub. Last hgb 11. GBS sent. Doing well.

## 2013-01-06 NOTE — Patient Instructions (Signed)
Pregnancy - Third Trimester  The third trimester of pregnancy (the last 3 months) is a period of the most rapid growth for you and your baby. The baby approaches a length of 20 inches and a weight of 6 to 10 pounds. The baby is adding on fat and getting ready for life outside your body. While inside, babies have periods of sleeping and waking, suck their thumbs, and hiccups. You can often feel small contractions of the uterus. This is false labor. It is also called Braxton-Hicks contractions. This is like a practice for labor. The usual problems in this stage of pregnancy include more difficulty breathing, swelling of the hands and feet from water retention, and having to urinate more often because of the uterus and baby pressing on your bladder.   PRENATAL EXAMS  · Blood work may continue to be done during prenatal exams. These tests are done to check on your health and the probable health of your baby. Blood work is used to follow your blood levels (hemoglobin). Anemia (low hemoglobin) is common during pregnancy. Iron and vitamins are given to help prevent this. You may also continue to be checked for diabetes. Some of the past blood tests may be done again.  · The size of the uterus is measured during each visit. This makes sure your baby is growing properly according to your pregnancy dates.  · Your blood pressure is checked every prenatal visit. This is to make sure you are not getting toxemia.  · Your urine is checked every prenatal visit for infection, diabetes and protein.  · Your weight is checked at each visit. This is done to make sure gains are happening at the suggested rate and that you and your baby are growing normally.  · Sometimes, an ultrasound is performed to confirm the position and the proper growth and development of the baby. This is a test done that bounces harmless sound waves off the baby so your caregiver can more accurately determine due dates.  · Discuss the type of pain medication and  anesthesia you will have during your labor and delivery.  · Discuss the possibility and anesthesia if a Cesarean Section might be necessary.  · Inform your caregiver if there is any mental or physical violence at home.  Sometimes, a specialized non-stress test, contraction stress test and biophysical profile are done to make sure the baby is not having a problem. Checking the amniotic fluid surrounding the baby is called an amniocentesis. The amniotic fluid is removed by sticking a needle into the belly (abdomen). This is sometimes done near the end of pregnancy if an early delivery is required. In this case, it is done to help make sure the baby's lungs are mature enough for the baby to live outside of the womb. If the lungs are not mature and it is unsafe to deliver the baby, an injection of cortisone medication is given to the mother 1 to 2 days before the delivery. This helps the baby's lungs mature and makes it safer to deliver the baby.  CHANGES OCCURING IN THE THIRD TRIMESTER OF PREGNANCY  Your body goes through many changes during pregnancy. They vary from person to person. Talk to your caregiver about changes you notice and are concerned about.  · During the last trimester, you have probably had an increase in your appetite. It is normal to have cravings for certain foods. This varies from person to person and pregnancy to pregnancy.  · You may begin to   get stretch marks on your hips, abdomen, and breasts. These are normal changes in the body during pregnancy. There are no exercises or medications to take which prevent this change.  · Constipation may be treated with a stool softener or adding bulk to your diet. Drinking lots of fluids, fiber in vegetables, fruits, and whole grains are helpful.  · Exercising is also helpful. If you have been very active up until your pregnancy, most of these activities can be continued during your pregnancy. If you have been less active, it is helpful to start an exercise  program such as walking. Consult your caregiver before starting exercise programs.  · Avoid all smoking, alcohol, un-prescribed drugs, herbs and "street drugs" during your pregnancy. These chemicals affect the formation and growth of the baby. Avoid chemicals throughout the pregnancy to ensure the delivery of a healthy infant.  · Backache, varicose veins and hemorrhoids may develop or get worse.  · You will tire more easily in the third trimester, which is normal.  · The baby's movements may be stronger and more often.  · You may become short of breath easily.  · Your belly button may stick out.  · A yellow discharge may leak from your breasts called colostrum.  · You may have a bloody mucus discharge. This usually occurs a few days to a week before labor begins.  HOME CARE INSTRUCTIONS   · Keep your caregiver's appointments. Follow your caregiver's instructions regarding medication use, exercise, and diet.  · During pregnancy, you are providing food for you and your baby. Continue to eat regular, well-balanced meals. Choose foods such as meat, fish, milk and other low fat dairy products, vegetables, fruits, and whole-grain breads and cereals. Your caregiver will tell you of the ideal weight gain.  · A physical sexual relationship may be continued throughout pregnancy if there are no other problems such as early (premature) leaking of amniotic fluid from the membranes, vaginal bleeding, or belly (abdominal) pain.  · Exercise regularly if there are no restrictions. Check with your caregiver if you are unsure of the safety of your exercises. Greater weight gain will occur in the last 2 trimesters of pregnancy. Exercising helps:  · Control your weight.  · Get you in shape for labor and delivery.  · You lose weight after you deliver.  · Rest a lot with legs elevated, or as needed for leg cramps or low back pain.  · Wear a good support or jogging bra for breast tenderness during pregnancy. This may help if worn during  sleep. Pads or tissues may be used in the bra if you are leaking colostrum.  · Do not use hot tubs, steam rooms, or saunas.  · Wear your seat belt when driving. This protects you and your baby if you are in an accident.  · Avoid raw meat, cat litter boxes and soil used by cats. These carry germs that can cause birth defects in the baby.  · It is easier to loose urine during pregnancy. Tightening up and strengthening the pelvic muscles will help with this problem. You can practice stopping your urination while you are going to the bathroom. These are the same muscles you need to strengthen. It is also the muscles you would use if you were trying to stop from passing gas. You can practice tightening these muscles up 10 times a set and repeating this about 3 times per day. Once you know what muscles to tighten up, do not perform these   exercises during urination. It is more likely to cause an infection by backing up the urine.  · Ask for help if you have financial, counseling or nutritional needs during pregnancy. Your caregiver will be able to offer counseling for these needs as well as refer you for other special needs.  · Make a list of emergency phone numbers and have them available.  · Plan on getting help from family or friends when you go home from the hospital.  · Make a trial run to the hospital.  · Take prenatal classes with the father to understand, practice and ask questions about the labor and delivery.  · Prepare the baby's room/nursery.  · Do not travel out of the city unless it is absolutely necessary and with the advice of your caregiver.  · Wear only low or no heal shoes to have better balance and prevent falling.  MEDICATIONS AND DRUG USE IN PREGNANCY  · Take prenatal vitamins as directed. The vitamin should contain 1 milligram of folic acid. Keep all vitamins out of reach of children. Only a couple vitamins or tablets containing iron may be fatal to a baby or young child when ingested.  · Avoid use  of all medications, including herbs, over-the-counter medications, not prescribed or suggested by your caregiver. Only take over-the-counter or prescription medicines for pain, discomfort, or fever as directed by your caregiver. Do not use aspirin, ibuprofen (Motrin®, Advil®, Nuprin®) or naproxen (Aleve®) unless OK'd by your caregiver.  · Let your caregiver also know about herbs you may be using.  · Alcohol is related to a number of birth defects. This includes fetal alcohol syndrome. All alcohol, in any form, should be avoided completely. Smoking will cause low birth rate and premature babies.  · Street/illegal drugs are very harmful to the baby. They are absolutely forbidden. A baby born to an addicted mother will be addicted at birth. The baby will go through the same withdrawal an adult does.  SEEK MEDICAL CARE IF:  You have any concerns or worries during your pregnancy. It is better to call with your questions if you feel they cannot wait, rather than worry about them.  DECISIONS ABOUT CIRCUMCISION  You may or may not know the sex of your baby. If you know your baby is a boy, it may be time to think about circumcision. Circumcision is the removal of the foreskin of the penis. This is the skin that covers the sensitive end of the penis. There is no proven medical need for this. Often this decision is made on what is popular at the time or based upon religious beliefs and social issues. You can discuss these issues with your caregiver or pediatrician.  SEEK IMMEDIATE MEDICAL CARE IF:   · An unexplained oral temperature above 102° F (38.9° C) develops, or as your caregiver suggests.  · You have leaking of fluid from the vagina (birth canal). If leaking membranes are suspected, take your temperature and tell your caregiver of this when you call.  · There is vaginal spotting, bleeding or passing clots. Tell your caregiver of the amount and how many pads are used.  · You develop a bad smelling vaginal discharge with  a change in the color from clear to white.  · You develop vomiting that lasts more than 24 hours.  · You develop chills or fever.  · You develop shortness of breath.  · You develop burning on urination.  · You loose more than 2 pounds of weight   or gain more than 2 pounds of weight or as suggested by your caregiver.  · You notice sudden swelling of your face, hands, and feet or legs.  · You develop belly (abdominal) pain. Round ligament discomfort is a common non-cancerous (benign) cause of abdominal pain in pregnancy. Your caregiver still must evaluate you.  · You develop a severe headache that does not go away.  · You develop visual problems, blurred or double vision.  · If you have not felt your baby move for more than 1 hour. If you think the baby is not moving as much as usual, eat something with sugar in it and lie down on your left side for an hour. The baby should move at least 4 to 5 times per hour. Call right away if your baby moves less than that.  · You fall, are in a car accident or any kind of trauma.  · There is mental or physical violence at home.  Document Released: 10/10/2001 Document Revised: 01/08/2012 Document Reviewed: 04/14/2009  [employer]® Patient Information ©2013 [employer].

## 2013-01-09 ENCOUNTER — Telehealth: Payer: Self-pay | Admitting: *Deleted

## 2013-01-09 LAB — CULTURE, BETA STREP (GROUP B ONLY)

## 2013-01-09 NOTE — Telephone Encounter (Signed)
Called pt to adv GBS negative

## 2013-01-12 ENCOUNTER — Encounter: Payer: Self-pay | Admitting: Obstetrics & Gynecology

## 2013-01-12 LAB — OB RESULTS CONSOLE GBS: GBS: NEGATIVE

## 2013-01-13 ENCOUNTER — Ambulatory Visit (INDEPENDENT_AMBULATORY_CARE_PROVIDER_SITE_OTHER): Payer: BC Managed Care – PPO | Admitting: Advanced Practice Midwife

## 2013-01-13 VITALS — BP 109/68 | Wt 170.0 lb

## 2013-01-13 DIAGNOSIS — O26899 Other specified pregnancy related conditions, unspecified trimester: Secondary | ICD-10-CM

## 2013-01-13 DIAGNOSIS — O26813 Pregnancy related exhaustion and fatigue, third trimester: Secondary | ICD-10-CM

## 2013-01-13 NOTE — Progress Notes (Signed)
P=88 

## 2013-01-13 NOTE — Progress Notes (Signed)
Doing well.  Good fetal movement, denies vaginal bleeding, LOF, regular contractions.  Reviewed GBS negative results.  Has supplies for water birth.  Reviewed when to come to hospital.

## 2013-01-20 ENCOUNTER — Ambulatory Visit (INDEPENDENT_AMBULATORY_CARE_PROVIDER_SITE_OTHER): Payer: BC Managed Care – PPO | Admitting: Obstetrics and Gynecology

## 2013-01-20 VITALS — BP 105/71 | Wt 171.0 lb

## 2013-01-20 DIAGNOSIS — Z3480 Encounter for supervision of other normal pregnancy, unspecified trimester: Secondary | ICD-10-CM

## 2013-01-20 DIAGNOSIS — Z3493 Encounter for supervision of normal pregnancy, unspecified, third trimester: Secondary | ICD-10-CM

## 2013-01-20 DIAGNOSIS — O3421 Maternal care for scar from previous cesarean delivery: Secondary | ICD-10-CM

## 2013-01-20 DIAGNOSIS — O34219 Maternal care for unspecified type scar from previous cesarean delivery: Secondary | ICD-10-CM

## 2013-01-20 NOTE — Patient Instructions (Signed)
Pregnancy - Third Trimester  The third trimester of pregnancy (the last 3 months) is a period of the most rapid growth for you and your baby. The baby approaches a length of 20 inches and a weight of 6 to 10 pounds. The baby is adding on fat and getting ready for life outside your body. While inside, babies have periods of sleeping and waking, suck their thumbs, and hiccups. You can often feel small contractions of the uterus. This is false labor. It is also called Braxton-Hicks contractions. This is like a practice for labor. The usual problems in this stage of pregnancy include more difficulty breathing, swelling of the hands and feet from water retention, and having to urinate more often because of the uterus and baby pressing on your bladder.   PRENATAL EXAMS  · Blood work may continue to be done during prenatal exams. These tests are done to check on your health and the probable health of your baby. Blood work is used to follow your blood levels (hemoglobin). Anemia (low hemoglobin) is common during pregnancy. Iron and vitamins are given to help prevent this. You may also continue to be checked for diabetes. Some of the past blood tests may be done again.  · The size of the uterus is measured during each visit. This makes sure your baby is growing properly according to your pregnancy dates.  · Your blood pressure is checked every prenatal visit. This is to make sure you are not getting toxemia.  · Your urine is checked every prenatal visit for infection, diabetes and protein.  · Your weight is checked at each visit. This is done to make sure gains are happening at the suggested rate and that you and your baby are growing normally.  · Sometimes, an ultrasound is performed to confirm the position and the proper growth and development of the baby. This is a test done that bounces harmless sound waves off the baby so your caregiver can more accurately determine due dates.  · Discuss the type of pain medication and  anesthesia you will have during your labor and delivery.  · Discuss the possibility and anesthesia if a Cesarean Section might be necessary.  · Inform your caregiver if there is any mental or physical violence at home.  Sometimes, a specialized non-stress test, contraction stress test and biophysical profile are done to make sure the baby is not having a problem. Checking the amniotic fluid surrounding the baby is called an amniocentesis. The amniotic fluid is removed by sticking a needle into the belly (abdomen). This is sometimes done near the end of pregnancy if an early delivery is required. In this case, it is done to help make sure the baby's lungs are mature enough for the baby to live outside of the womb. If the lungs are not mature and it is unsafe to deliver the baby, an injection of cortisone medication is given to the mother 1 to 2 days before the delivery. This helps the baby's lungs mature and makes it safer to deliver the baby.  CHANGES OCCURING IN THE THIRD TRIMESTER OF PREGNANCY  Your body goes through many changes during pregnancy. They vary from person to person. Talk to your caregiver about changes you notice and are concerned about.  · During the last trimester, you have probably had an increase in your appetite. It is normal to have cravings for certain foods. This varies from person to person and pregnancy to pregnancy.  · You may begin to   get stretch marks on your hips, abdomen, and breasts. These are normal changes in the body during pregnancy. There are no exercises or medications to take which prevent this change.  · Constipation may be treated with a stool softener or adding bulk to your diet. Drinking lots of fluids, fiber in vegetables, fruits, and whole grains are helpful.  · Exercising is also helpful. If you have been very active up until your pregnancy, most of these activities can be continued during your pregnancy. If you have been less active, it is helpful to start an exercise  program such as walking. Consult your caregiver before starting exercise programs.  · Avoid all smoking, alcohol, un-prescribed drugs, herbs and "street drugs" during your pregnancy. These chemicals affect the formation and growth of the baby. Avoid chemicals throughout the pregnancy to ensure the delivery of a healthy infant.  · Backache, varicose veins and hemorrhoids may develop or get worse.  · You will tire more easily in the third trimester, which is normal.  · The baby's movements may be stronger and more often.  · You may become short of breath easily.  · Your belly button may stick out.  · A yellow discharge may leak from your breasts called colostrum.  · You may have a bloody mucus discharge. This usually occurs a few days to a week before labor begins.  HOME CARE INSTRUCTIONS   · Keep your caregiver's appointments. Follow your caregiver's instructions regarding medication use, exercise, and diet.  · During pregnancy, you are providing food for you and your baby. Continue to eat regular, well-balanced meals. Choose foods such as meat, fish, milk and other low fat dairy products, vegetables, fruits, and whole-grain breads and cereals. Your caregiver will tell you of the ideal weight gain.  · A physical sexual relationship may be continued throughout pregnancy if there are no other problems such as early (premature) leaking of amniotic fluid from the membranes, vaginal bleeding, or belly (abdominal) pain.  · Exercise regularly if there are no restrictions. Check with your caregiver if you are unsure of the safety of your exercises. Greater weight gain will occur in the last 2 trimesters of pregnancy. Exercising helps:  · Control your weight.  · Get you in shape for labor and delivery.  · You lose weight after you deliver.  · Rest a lot with legs elevated, or as needed for leg cramps or low back pain.  · Wear a good support or jogging bra for breast tenderness during pregnancy. This may help if worn during  sleep. Pads or tissues may be used in the bra if you are leaking colostrum.  · Do not use hot tubs, steam rooms, or saunas.  · Wear your seat belt when driving. This protects you and your baby if you are in an accident.  · Avoid raw meat, cat litter boxes and soil used by cats. These carry germs that can cause birth defects in the baby.  · It is easier to loose urine during pregnancy. Tightening up and strengthening the pelvic muscles will help with this problem. You can practice stopping your urination while you are going to the bathroom. These are the same muscles you need to strengthen. It is also the muscles you would use if you were trying to stop from passing gas. You can practice tightening these muscles up 10 times a set and repeating this about 3 times per day. Once you know what muscles to tighten up, do not perform these   exercises during urination. It is more likely to cause an infection by backing up the urine.  · Ask for help if you have financial, counseling or nutritional needs during pregnancy. Your caregiver will be able to offer counseling for these needs as well as refer you for other special needs.  · Make a list of emergency phone numbers and have them available.  · Plan on getting help from family or friends when you go home from the hospital.  · Make a trial run to the hospital.  · Take prenatal classes with the father to understand, practice and ask questions about the labor and delivery.  · Prepare the baby's room/nursery.  · Do not travel out of the city unless it is absolutely necessary and with the advice of your caregiver.  · Wear only low or no heal shoes to have better balance and prevent falling.  MEDICATIONS AND DRUG USE IN PREGNANCY  · Take prenatal vitamins as directed. The vitamin should contain 1 milligram of folic acid. Keep all vitamins out of reach of children. Only a couple vitamins or tablets containing iron may be fatal to a baby or young child when ingested.  · Avoid use  of all medications, including herbs, over-the-counter medications, not prescribed or suggested by your caregiver. Only take over-the-counter or prescription medicines for pain, discomfort, or fever as directed by your caregiver. Do not use aspirin, ibuprofen (Motrin®, Advil®, Nuprin®) or naproxen (Aleve®) unless OK'd by your caregiver.  · Let your caregiver also know about herbs you may be using.  · Alcohol is related to a number of birth defects. This includes fetal alcohol syndrome. All alcohol, in any form, should be avoided completely. Smoking will cause low birth rate and premature babies.  · Street/illegal drugs are very harmful to the baby. They are absolutely forbidden. A baby born to an addicted mother will be addicted at birth. The baby will go through the same withdrawal an adult does.  SEEK MEDICAL CARE IF:  You have any concerns or worries during your pregnancy. It is better to call with your questions if you feel they cannot wait, rather than worry about them.  DECISIONS ABOUT CIRCUMCISION  You may or may not know the sex of your baby. If you know your baby is a boy, it may be time to think about circumcision. Circumcision is the removal of the foreskin of the penis. This is the skin that covers the sensitive end of the penis. There is no proven medical need for this. Often this decision is made on what is popular at the time or based upon religious beliefs and social issues. You can discuss these issues with your caregiver or pediatrician.  SEEK IMMEDIATE MEDICAL CARE IF:   · An unexplained oral temperature above 102° F (38.9° C) develops, or as your caregiver suggests.  · You have leaking of fluid from the vagina (birth canal). If leaking membranes are suspected, take your temperature and tell your caregiver of this when you call.  · There is vaginal spotting, bleeding or passing clots. Tell your caregiver of the amount and how many pads are used.  · You develop a bad smelling vaginal discharge with  a change in the color from clear to white.  · You develop vomiting that lasts more than 24 hours.  · You develop chills or fever.  · You develop shortness of breath.  · You develop burning on urination.  · You loose more than 2 pounds of weight   or gain more than 2 pounds of weight or as suggested by your caregiver.  · You notice sudden swelling of your face, hands, and feet or legs.  · You develop belly (abdominal) pain. Round ligament discomfort is a common non-cancerous (benign) cause of abdominal pain in pregnancy. Your caregiver still must evaluate you.  · You develop a severe headache that does not go away.  · You develop visual problems, blurred or double vision.  · If you have not felt your baby move for more than 1 hour. If you think the baby is not moving as much as usual, eat something with sugar in it and lie down on your left side for an hour. The baby should move at least 4 to 5 times per hour. Call right away if your baby moves less than that.  · You fall, are in a car accident or any kind of trauma.  · There is mental or physical violence at home.  Document Released: 10/10/2001 Document Revised: 01/08/2012 Document Reviewed: 04/14/2009  [employer]® Patient Information ©2013 [employer].

## 2013-01-20 NOTE — Progress Notes (Signed)
Doing well. Discussed waterbirth VBAC plans.

## 2013-01-20 NOTE — Progress Notes (Signed)
P = 91 

## 2013-01-26 ENCOUNTER — Encounter (HOSPITAL_COMMUNITY): Payer: Self-pay | Admitting: *Deleted

## 2013-01-26 ENCOUNTER — Inpatient Hospital Stay (HOSPITAL_COMMUNITY)
Admission: AD | Admit: 2013-01-26 | Discharge: 2013-01-27 | Disposition: A | Discharge status: Home / Self Care | Payer: BC Managed Care – PPO | Source: Ambulatory Visit | Attending: Obstetrics & Gynecology | Admitting: Obstetrics & Gynecology

## 2013-01-26 DIAGNOSIS — O99891 Other specified diseases and conditions complicating pregnancy: Secondary | ICD-10-CM | POA: Insufficient documentation

## 2013-01-26 DIAGNOSIS — O471 False labor at or after 37 completed weeks of gestation: Secondary | ICD-10-CM

## 2013-01-26 DIAGNOSIS — O479 False labor, unspecified: Secondary | ICD-10-CM | POA: Insufficient documentation

## 2013-01-26 LAB — WET PREP, GENITAL
Clue Cells Wet Prep HPF POC: NONE SEEN
Yeast Wet Prep HPF POC: NONE SEEN

## 2013-01-26 NOTE — MAU Provider Note (Signed)
History     CSN: [id number]  Arrival date and time: 01/26/13 2137   None     Chief Complaint  Patient presents with  . Labor Eval   HPI   Ctx 48 hours. Today from 4 PM q 3-10 min. Mucous plug, some trickling of clear discharge. No bleeding. Baby moving well. Ctx 4/10. Last intercourse more than 48 hours.  Hx first delivery by c-section for twins. Second by water birth. Wants water birth this time. Gets care at [facility]. Last visit 3/24. Next visit tomorrow.   OB History   Grav Para Term Preterm Abortions TAB SAB Ect Mult Living   3 2 2      1 3       Past Medical History  Diagnosis Date  . Anxiety   . Depression   . Pregnancy related condition in third trimester     [redacted] weeks gestation complicated by depression/anxiety  . H/O abnormal Pap smear     Past Surgical History  Procedure Laterality Date  . Cesarean section      Family History  Problem Relation Age of Onset  . Depression Mother     History  Substance Use Topics  . Smoking status: Never Smoker   . Smokeless tobacco: Never Used  . Alcohol Use: No    Allergies: No Known Allergies  Prescriptions prior to admission  Medication Sig Dispense Refill  . Prenatal Vit-Fe Fumarate-FA (PRENATAL MULTIVITAMIN) TABS Take 1 tablet by mouth daily at 12 noon.        ROS as per HPI   Physical Exam   Blood pressure 115/74, pulse 73, temperature 98 F (36.7 C), temperature source Oral, resp. rate 18, height 5\' 6"  (1.676 m), weight 77.747 kg (171 lb 6.4 oz), last menstrual period 04/01/2012.  Physical Exam  Constitutional: She is oriented to person, place, and time. She appears well-developed and well-nourished. No distress.  HENT:  Head: Normocephalic and atraumatic.  Eyes: Conjunctivae and EOM are normal.  Neck: Normal range of motion. Neck supple.  Cardiovascular: Normal rate, regular rhythm and normal heart sounds.   Respiratory: Effort normal and breath sounds normal. No respiratory distress.  GI:  Soft. Bowel sounds are normal. She exhibits no distension.  Genitourinary:  Normal external genitalia. Normal vagina. Pooling of fluid - yellow/clear with specks of blood. Cervix visually FT to 1 cm.  Digital exam:  1.5/50/-2, posterior  Musculoskeletal: Normal range of motion. She exhibits no edema and no tenderness.  Neurological: She is alert and oriented to person, place, and time.  Skin: Skin is warm and dry.  Psychiatric: She has a normal mood and affect.   Results for orders placed during the hospital encounter of 01/26/13 (from the past 24 hour(s))  WET PREP, GENITAL     Status: Abnormal   Collection Time    01/26/13 10:28 PM      Result Value Range   Yeast Wet Prep HPF POC NONE SEEN  NONE SEEN   Trich, Wet Prep NONE SEEN  NONE SEEN   Clue Cells Wet Prep HPF POC NONE SEEN  NONE SEEN   WBC, Wet Prep HPF POC FEW (*) NONE SEEN  AMNISURE RUPTURE OF MEMBRANE (ROM)     Status: None   Collection Time    01/26/13 11:15 PM      Result Value Range   Amnisure ROM NEGATIVE    POCT FERN TEST     Status: None   Collection Time    01/27/13 12:26 AM  Result Value Range   POCT Fern Test Negative = intact amniotic membranes      MAU Course  Procedures  FHTs:  130, moderate variability, accels present, occasional variable. TOCO:  q 2-6 minutes   Assessment and Plan  31 y.o. G3P2003 at [redacted]w[redacted]d with contractions and watery discharge  - Fern and amnisure negative - Wet prep negative - Cervix 1.5 cm.  - Pt opted to go home and return when contractions more intense or water breaks - F/U at [facility] as scheduled.  Napoleon Form 01/26/2013, 10:21 PM

## 2013-01-26 NOTE — MAU Note (Signed)
Pt G3 P3 at 38.6wks having contractions every 3-22min and having a clear trickle of fluid since 1900.

## 2013-01-27 ENCOUNTER — Inpatient Hospital Stay (HOSPITAL_COMMUNITY)
Admission: AD | Admit: 2013-01-27 | Discharge: 2013-01-28 | DRG: 373 | Disposition: A | Discharge status: Home / Self Care | Payer: BC Managed Care – PPO | Source: Ambulatory Visit | Attending: Obstetrics & Gynecology | Admitting: Obstetrics & Gynecology

## 2013-01-27 ENCOUNTER — Encounter (HOSPITAL_COMMUNITY): Payer: Self-pay | Admitting: *Deleted

## 2013-01-27 ENCOUNTER — Encounter: Payer: Self-pay | Admitting: Advanced Practice Midwife

## 2013-01-27 DIAGNOSIS — O479 False labor, unspecified: Secondary | ICD-10-CM

## 2013-01-27 DIAGNOSIS — O34219 Maternal care for unspecified type scar from previous cesarean delivery: Secondary | ICD-10-CM

## 2013-01-27 DIAGNOSIS — IMO0001 Reserved for inherently not codable concepts without codable children: Secondary | ICD-10-CM

## 2013-01-27 LAB — CBC
HCT: 34.6 % — ABNORMAL LOW (ref 36.0–46.0)
Hemoglobin: 11.3 g/dL — ABNORMAL LOW (ref 12.0–15.0)
MCHC: 32.7 g/dL (ref 30.0–36.0)
RDW: 15.5 % (ref 11.5–15.5)
WBC: 11.2 10*3/uL — ABNORMAL HIGH (ref 4.0–10.5)

## 2013-01-27 LAB — TYPE AND SCREEN
ABO/RH(D): A POS
Antibody Screen: NEGATIVE

## 2013-01-27 MED ORDER — LACTATED RINGERS IV SOLN: 500.0000 mL | INTRAVENOUS | Status: DC | PRN

## 2013-01-27 MED ORDER — FLEET ENEMA 7-19 GM/118ML RE ENEM: 1.0000 | ENEMA | RECTAL | Status: DC | PRN

## 2013-01-27 MED ORDER — OXYTOCIN 10 UNIT/ML IJ SOLN
INTRAMUSCULAR | Status: AC
  Filled 2013-01-27: qty 2

## 2013-01-27 MED ORDER — WITCH HAZEL-GLYCERIN EX PADS: 1.0000 "application " | MEDICATED_PAD | CUTANEOUS | Status: DC | PRN

## 2013-01-27 MED ORDER — SIMETHICONE 80 MG PO CHEW: 80.0000 mg | CHEWABLE_TABLET | ORAL | Status: DC | PRN

## 2013-01-27 MED ORDER — OXYTOCIN 40 UNITS IN LACTATED RINGERS INFUSION - SIMPLE MED
62.5000 mL/h | INTRAVENOUS | Status: DC
  Filled 2013-01-27: qty 1000

## 2013-01-27 MED ORDER — ACETAMINOPHEN 325 MG PO TABS: 650.0000 mg | ORAL_TABLET | ORAL | Status: DC | PRN

## 2013-01-27 MED ORDER — LANOLIN HYDROUS EX OINT: TOPICAL_OINTMENT | CUTANEOUS | Status: DC | PRN

## 2013-01-27 MED ORDER — FERROUS SULFATE 325 (65 FE) MG PO TABS
325.0000 mg | ORAL_TABLET | Freq: Two times a day (BID) | ORAL | Status: DC
  Administered 2013-01-27 – 2013-01-28 (×2): 325 mg via ORAL
  Filled 2013-01-27 (×2): qty 1

## 2013-01-27 MED ORDER — OXYTOCIN BOLUS FROM INFUSION: 500.0000 mL | INTRAVENOUS | Status: DC

## 2013-01-27 MED ORDER — OXYCODONE-ACETAMINOPHEN 5-325 MG PO TABS: 1.0000 | ORAL_TABLET | ORAL | Status: DC | PRN

## 2013-01-27 MED ORDER — IBUPROFEN 600 MG PO TABS
600.0000 mg | ORAL_TABLET | Freq: Four times a day (QID) | ORAL | Status: DC
  Administered 2013-01-27 – 2013-01-28 (×5): 600 mg via ORAL
  Filled 2013-01-27 (×2): qty 1

## 2013-01-27 MED ORDER — BENZOCAINE-MENTHOL 20-0.5 % EX AERO
1.0000 "application " | INHALATION_SPRAY | CUTANEOUS | Status: DC | PRN
  Administered 2013-01-27 – 2013-01-28 (×2): 1 via TOPICAL
  Filled 2013-01-27 (×2): qty 56

## 2013-01-27 MED ORDER — DIBUCAINE 1 % RE OINT: 1.0000 "application " | TOPICAL_OINTMENT | RECTAL | Status: DC | PRN

## 2013-01-27 MED ORDER — DIPHENHYDRAMINE HCL 25 MG PO CAPS: 25.0000 mg | ORAL_CAPSULE | Freq: Four times a day (QID) | ORAL | Status: DC | PRN

## 2013-01-27 MED ORDER — ONDANSETRON HCL 4 MG/2ML IJ SOLN: 4.0000 mg | INTRAMUSCULAR | Status: DC | PRN

## 2013-01-27 MED ORDER — ONDANSETRON HCL 4 MG/2ML IJ SOLN: 4.0000 mg | Freq: Four times a day (QID) | INTRAMUSCULAR | Status: DC | PRN

## 2013-01-27 MED ORDER — PRENATAL MULTIVITAMIN CH
1.0000 | ORAL_TABLET | Freq: Every day | ORAL | Status: DC
  Administered 2013-01-27 – 2013-01-28 (×2): 1 via ORAL
  Filled 2013-01-27 (×2): qty 1

## 2013-01-27 MED ORDER — BISACODYL 10 MG RE SUPP: 10.0000 mg | Freq: Every day | RECTAL | Status: DC | PRN

## 2013-01-27 MED ORDER — FLEET ENEMA 7-19 GM/118ML RE ENEM: 1.0000 | ENEMA | Freq: Every day | RECTAL | Status: DC | PRN

## 2013-01-27 MED ORDER — IBUPROFEN 600 MG PO TABS
600.0000 mg | ORAL_TABLET | Freq: Four times a day (QID) | ORAL | Status: DC | PRN
  Filled 2013-01-27 (×3): qty 1

## 2013-01-27 MED ORDER — ONDANSETRON HCL 4 MG PO TABS: 4.0000 mg | ORAL_TABLET | ORAL | Status: DC | PRN

## 2013-01-27 MED ORDER — SENNOSIDES-DOCUSATE SODIUM 8.6-50 MG PO TABS
2.0000 | ORAL_TABLET | Freq: Every day | ORAL | Status: DC
  Administered 2013-01-27: 2 via ORAL

## 2013-01-27 MED ORDER — TETANUS-DIPHTH-ACELL PERTUSSIS 5-2.5-18.5 LF-MCG/0.5 IM SUSP: 0.5000 mL | Freq: Once | INTRAMUSCULAR | Status: DC

## 2013-01-27 MED ORDER — LIDOCAINE HCL (PF) 1 % IJ SOLN
30.0000 mL | INTRAMUSCULAR | Status: DC | PRN
  Administered 2013-01-27: 30 mL via SUBCUTANEOUS
  Filled 2013-01-27 (×2): qty 30

## 2013-01-27 MED ORDER — ZOLPIDEM TARTRATE 5 MG PO TABS: 5.0000 mg | ORAL_TABLET | Freq: Every evening | ORAL | Status: DC | PRN

## 2013-01-27 MED ORDER — MEASLES, MUMPS & RUBELLA VAC ~~LOC~~ INJ
0.5000 mL | INJECTION | Freq: Once | SUBCUTANEOUS | Status: DC
  Filled 2013-01-27: qty 0.5

## 2013-01-27 MED ORDER — NALBUPHINE SYRINGE 5 MG/0.5 ML
10.0000 mg | INJECTION | INTRAMUSCULAR | Status: DC | PRN
  Filled 2013-01-27: qty 1

## 2013-01-27 MED ORDER — OXYTOCIN 40 UNITS IN LACTATED RINGERS INFUSION - SIMPLE MED: 62.5000 mL/h | INTRAVENOUS | Status: DC | PRN

## 2013-01-27 MED ORDER — LACTATED RINGERS IV SOLN: INTRAVENOUS | Status: DC

## 2013-01-27 MED ORDER — CITRIC ACID-SODIUM CITRATE 334-500 MG/5ML PO SOLN: 30.0000 mL | ORAL | Status: DC | PRN

## 2013-01-27 NOTE — MAU Note (Signed)
Contractions every 2 mins

## 2013-01-27 NOTE — H&P (Signed)
Vanessa Larson is a 32 y.o. female presenting for contractions. Was here in MAU last night and only 1 cm. Ctx much stronger. No loss of fluid or vaginal bleeding. Baby moving well.  Patient of Kathryne Sharper. Desires water birth. No complications of pregnancy, prior pregnancies or deliveries. First pregnancy delivered by cesarean section (twins). Successful VBAC water birth with second pregnancy.  Maternal Medical History:  Reason for admission: Contractions.  Nausea.  Contractions: Onset was 2 days ago.   Frequency: regular.   Perceived severity is strong.    Fetal activity: Perceived fetal activity is normal.   Last perceived fetal movement was within the past hour.    Prenatal complications: no prenatal complications Prenatal Complications - Diabetes: none.    OB History   Grav Para Term Preterm Abortions TAB SAB Ect Mult Living   3 2 2      1 3      Past Medical History  Diagnosis Date  . Anxiety   . Depression   . Pregnancy related condition in third trimester     [redacted] weeks gestation complicated by depression/anxiety  . H/O abnormal Pap smear    Past Surgical History  Procedure Laterality Date  . Cesarean section     Family History: family history includes Depression in her mother. Social History:  reports that she has never smoked. She has never used smokeless tobacco. She reports that she does not drink alcohol or use illicit drugs.   Prenatal Transfer Tool  Maternal Diabetes: No Genetic Screening: Declined Maternal Ultrasounds/Referrals: Normal Fetal Ultrasounds or other Referrals:  None Maternal Substance Abuse:  No Significant Maternal Medications:  None Significant Maternal Lab Results:  Lab values include: Group B Strep negative Other Comments:  None  Review of Systems  Constitutional: Negative for fever and chills.  Eyes: Negative for blurred vision and double vision.  Gastrointestinal: Negative for nausea and vomiting.  Neurological: Negative for  headaches.  :   Dilation: 6 Effacement (%): 80 Station: -1 Exam by:: L. Munford RN Blood pressure 126/73, pulse 88, resp. rate 18, last menstrual period 04/01/2012. Exam Physical Exam  Constitutional: She is oriented to person, place, and time. She appears well-developed and well-nourished. No distress.  HENT:  Head: Normocephalic and atraumatic.  Eyes: Conjunctivae and EOM are normal.  Neck: Normal range of motion. Neck supple.  Cardiovascular: Normal rate.   Respiratory: Effort normal. No respiratory distress.  GI: Soft. There is no rebound and no guarding.  Musculoskeletal: Normal range of motion. She exhibits no edema and no tenderness.  Neurological: She is alert and oriented to person, place, and time.  Skin: Skin is warm and dry.  Psychiatric: She has a normal mood and affect.    Dilation: 6 Effacement (%): 80 Cervical Position: Middle Station: -1 Presentation: Vertex Exam by:: L. Munford RN  FHTs:  130, mod var, accels present, no decels TOCO:  q 2-5 minutes  Prenatal labs: ABO, Rh: A/POS/-- (08/23 2952) Antibody: NEG (08/23 0909) Rubella: 19.6 (08/23 0909) RPR: NON REAC (01/13 0946)  HBsAg: NEGATIVE (08/23 0909)  HIV: NON REACTIVE (01/13 0946)  GBS:   negative  Assessment/Plan: 32 y.o. G3P2003 at [redacted]w[redacted]d with prior c-section and subsequent VBAC in active labor - Admit to L&D - Desires water birth - Intermittent monitoring, minimal intervention - GBS negative - Anticipate SVD  Napoleon Form 01/27/2013, 4:02 AM

## 2013-01-28 MED ORDER — IBUPROFEN 600 MG PO TABS: 600.0000 mg | ORAL_TABLET | Freq: Four times a day (QID) | ORAL | Status: DC

## 2013-01-28 NOTE — Discharge Summary (Signed)
Obstetric Discharge Summary Reason for Admission: onset of labor Prenatal Procedures: NST Intrapartum Procedures: spontaneous vaginal delivery and Water Birth Postpartum Procedures: none Complications-Operative and Postpartum: 2nd Degree perineal degree perineal laceration Hemoglobin  Date Value Range Status  01/27/2013 11.3* 12.0 - 15.0 g/dL Final     HCT  Date Value Range Status  01/27/2013 34.6* 36.0 - 46.0 % Final  Hospital Course: Delivery Note  At 6:21 AM a viable female was delivered via Vaginal, Spontaneous Delivery (Presentation: Left Occiput Anterior). APGAR: 8, 9; weight pending.  Placenta status: Intact, Spontaneous. Cord: 3 vessels with the following complications: None. Cord pH: n/a  Anesthesia: None  Episiotomy: None  Lacerations: 2nd degree  Suture Repair: 3.0 vicryl rapide  Est. Blood Loss (mL): 350  Mom to postpartum. Baby to nursery-stable.  Napoleon Form  01/27/2013, 7:42 AM   I was there with the water birth also.  No complications. Baby has done well feeding afterward and mother is well. See assessment below     Physical Exam:  General: alert, cooperative and no distress Lochia: appropriate Uterine Fundus: firm Incision: healing well DVT Evaluation: No evidence of DVT seen on physical exam.  Discharge Diagnoses: Term Pregnancy-delivered  Discharge Information: Date: 01/28/2013 Activity: unrestricted and pelvic rest Diet: routine Medications: PNV and Ibuprofen Condition: stable Instructions: refer to practice specific booklet Discharge to: home Follow-up Information   Follow up with [facility] [place] In 6 weeks.   Contact information:   [address], [address] [place] [address] [phone]      Newborn Data: Live born female  Birth Weight: 8 lb 11.7 oz (3960 g) APGAR: 8, 9  Home with mother.  [facility] 01/28/2013, 7:44 AM

## 2013-01-29 NOTE — Progress Notes (Signed)
Pt discharged before CSW could assess history of depression/anxiety.   

## 2013-01-29 NOTE — H&P (Signed)
Agree with above note.  LEGGETT,KELLY H. 01/29/2013 10:43 AM

## 2013-01-29 NOTE — MAU Provider Note (Signed)
Attestation of Attending Supervision of Advanced Practitioner (CNM/NP): Evaluation and management procedures were performed by the Advanced Practitioner under my supervision and collaboration. I have reviewed the Advanced Practitioner's note and chart, and I agree with the management and plan.  LEGGETT,KELLY H. 10:43 AM

## 2013-02-05 ENCOUNTER — Ambulatory Visit (INDEPENDENT_AMBULATORY_CARE_PROVIDER_SITE_OTHER): Payer: BC Managed Care – PPO | Admitting: Obstetrics & Gynecology

## 2013-02-05 ENCOUNTER — Encounter: Payer: Self-pay | Admitting: Obstetrics & Gynecology

## 2013-02-05 VITALS — BP 117/69 | HR 87 | Resp 16 | Ht 66.0 in | Wt 158.0 lb

## 2013-02-05 DIAGNOSIS — L293 Anogenital pruritus, unspecified: Secondary | ICD-10-CM

## 2013-02-05 DIAGNOSIS — L290 Pruritus ani: Secondary | ICD-10-CM

## 2013-02-05 NOTE — Progress Notes (Signed)
  Subjective:    Patient ID: Vanessa Larson, female    DOB: 05-02-1981, 32 y.o.   MRN: [id number]  HPI  32 yo P4 1 week pp s/p waterbirth VBAC. She complains of some perineal itching. Breastfeeding is going well.  Review of Systems     Objective:   Physical Exam  2nd degree laceration healing well. There is a knotted stitch at the top that is at the center of the area she describes as itchy. I removed this stitch/knot. Her vulva and vagina are atrophic c/w breastfeeding. With a speculum exam, there is no vaginal discharge noted, only a small amount of blood.      Assessment & Plan:  Perineal itching- most likely due to the stitch. RTC 4-5 weeks for pp visit/prn sooner.

## 2013-03-03 ENCOUNTER — Ambulatory Visit: Payer: BC Managed Care – PPO

## 2013-03-10 ENCOUNTER — Encounter: Payer: Self-pay | Admitting: Advanced Practice Midwife

## 2013-03-10 ENCOUNTER — Ambulatory Visit (INDEPENDENT_AMBULATORY_CARE_PROVIDER_SITE_OTHER): Payer: BC Managed Care – PPO | Admitting: Advanced Practice Midwife

## 2013-03-10 VITALS — BP 105/71 | HR 79 | Resp 16 | Ht 66.0 in | Wt 147.0 lb

## 2013-03-10 DIAGNOSIS — O91019 Infection of nipple associated with pregnancy, unspecified trimester: Secondary | ICD-10-CM

## 2013-03-10 DIAGNOSIS — F329 Major depressive disorder, single episode, unspecified: Secondary | ICD-10-CM

## 2013-03-10 DIAGNOSIS — O99345 Other mental disorders complicating the puerperium: Secondary | ICD-10-CM

## 2013-03-10 DIAGNOSIS — F53 Postpartum depression: Secondary | ICD-10-CM

## 2013-03-10 MED ORDER — SERTRALINE HCL 50 MG PO TABS: 50.0000 mg | ORAL_TABLET | Freq: Every day | ORAL | Status: DC

## 2013-03-10 MED ORDER — NYSTATIN 100000 UNIT/ML MT SUSP: OROMUCOSAL | Status: DC

## 2013-03-10 NOTE — Progress Notes (Signed)
Subjective:     Vanessa Larson is a 32 y.o. female who presents for a postpartum visit. She is 6 weeks postpartum following a SVD/waterbirth. I have fully reviewed the prenatal and intrapartum course. The delivery was at 39 gestational weeks. Outcome: vaginal birth after cesarean (VBAC). Anesthesia: none. Postpartum course has been uncomplicated. Baby's course has been nomral. Baby is feeding by breast. Bleeding no bleeding. Bowel function is normal. Bladder function is normal. Patient is not sexually active. Contraception method is abstinence at this time. Pt's husband plans a vasectomy, will use condoms until then. . Postpartum depression screening: positive. EPDS score 20. Denies SI/HI. Reports ongoing issues with depression which has worsened, seeing counselor, but would like to start an antidepressant medication. Pt also c/o possible yeast in right breast - has sharp shooting pains and some nipple pain with nursing, has also noticed increased pinkness of skin on right nipple. Pain is all right sided, left breast is fine.   The following portions of the patient's history were reviewed and updated as appropriate: allergies, current medications, past family history, past medical history, past social history, past surgical history and problem list.  Review of Systems Pertinent items are noted in HPI.   Objective:    BP 105/71  Pulse 79  Resp 16  Ht 5\' 6"  (1.676 m)  Wt 147 lb (66.679 kg)  BMI 23.74 kg/m2  Breastfeeding? Yes  General:  alert, cooperative and no distress   Breasts:  negative findings: normal in size and symmetry, normal contour with no evidence of flattening or dimpling, palpation negative for masses or nodules and positive findings: nipple dermatitis on the right        Abdomen: soft, non-tender; bowel sounds normal; no masses,  no organomegaly   Assessment:     6 week postpartum exam. Pap smear not done at today's visit.  Breast candidiasis Postpartum Depression  Plan:     1. Contraception: condoms and vasectomy 2. Rx Zoloft 50 mg qday. Follow up in 1 month for recheck. Rev'd precautions, report worsening symptoms immediately 3. Nystatin suspension to nipple QID, pt will take baby to pediatrician for yeast treatment if needed. Sterilize pacifiers and breast pump.  3. Follow up in: 4 weeks or as needed.

## 2013-03-17 ENCOUNTER — Ambulatory Visit: Payer: BC Managed Care – PPO

## 2013-04-07 ENCOUNTER — Encounter: Payer: Self-pay | Admitting: Family

## 2013-04-07 ENCOUNTER — Ambulatory Visit (HOSPITAL_COMMUNITY): Payer: BC Managed Care – PPO | Admitting: Behavioral Health

## 2013-04-07 ENCOUNTER — Ambulatory Visit (INDEPENDENT_AMBULATORY_CARE_PROVIDER_SITE_OTHER): Payer: BC Managed Care – PPO | Admitting: Family

## 2013-04-07 VITALS — BP 99/74 | HR 80 | Resp 16 | Ht 66.0 in | Wt 145.0 lb

## 2013-04-07 DIAGNOSIS — Z09 Encounter for follow-up examination after completed treatment for conditions other than malignant neoplasm: Secondary | ICD-10-CM

## 2013-04-07 DIAGNOSIS — O99345 Other mental disorders complicating the puerperium: Secondary | ICD-10-CM

## 2013-04-07 DIAGNOSIS — Z3009 Encounter for other general counseling and advice on contraception: Secondary | ICD-10-CM

## 2013-04-07 NOTE — Patient Instructions (Addendum)

## 2013-04-07 NOTE — Progress Notes (Signed)
Pt is here for follow-up evaluation after beginning on Zoloft for postpartum depression.  Pt states that she noticed an improvement in mood within 5 days of starting meds and that she is happy she made that decision.  Also reports that breast irritation resolved that same day as visit.  Husband did not get vasectomy.  Undecided now regarding family planning option.  Personally against the IUD because fertilization is possible.  Condoms are causing irritation and OCPs affect mood greatly.    Reviewed Nuvaring with [place] and non-latex condoms.  She will discuss with husband and make a decision regarding family planning.

## 2013-06-02 ENCOUNTER — Encounter (HOSPITAL_COMMUNITY): Payer: Self-pay | Admitting: Behavioral Health

## 2013-06-23 ENCOUNTER — Telehealth: Payer: Self-pay | Admitting: *Deleted

## 2013-06-23 DIAGNOSIS — O99345 Other mental disorders complicating the puerperium: Secondary | ICD-10-CM

## 2013-06-23 MED ORDER — SERTRALINE HCL 50 MG PO TABS: 50.0000 mg | ORAL_TABLET | Freq: Every day | ORAL | Status: DC

## 2013-06-23 NOTE — Telephone Encounter (Signed)
Pt called in to ask for refill of Zoloft - I reviewed chart and pt has been compliant as far as following up as requested - Pt has also seen Serafina Mitchell, [facility], but he is not refilling Zoloft - I will send in script with one refill since we were helping her out during pregnancy but will adv pt that she will need to follow up with PCP for future refills on this medicine.

## 2013-07-21 ENCOUNTER — Ambulatory Visit (INDEPENDENT_AMBULATORY_CARE_PROVIDER_SITE_OTHER): Payer: BC Managed Care – PPO | Admitting: Physician Assistant

## 2013-07-21 ENCOUNTER — Encounter: Payer: Self-pay | Admitting: Physician Assistant

## 2013-07-21 VITALS — BP 104/67 | HR 77 | Wt 153.0 lb

## 2013-07-21 DIAGNOSIS — Z13220 Encounter for screening for lipoid disorders: Secondary | ICD-10-CM

## 2013-07-21 DIAGNOSIS — F329 Major depressive disorder, single episode, unspecified: Secondary | ICD-10-CM

## 2013-07-21 DIAGNOSIS — F32A Depression, unspecified: Secondary | ICD-10-CM

## 2013-07-21 DIAGNOSIS — F3289 Other specified depressive episodes: Secondary | ICD-10-CM

## 2013-07-21 DIAGNOSIS — Z79899 Other long term (current) drug therapy: Secondary | ICD-10-CM

## 2013-07-21 DIAGNOSIS — F419 Anxiety disorder, unspecified: Secondary | ICD-10-CM

## 2013-07-21 DIAGNOSIS — L909 Atrophic disorder of skin, unspecified: Secondary | ICD-10-CM

## 2013-07-21 DIAGNOSIS — L987 Excessive and redundant skin and subcutaneous tissue: Secondary | ICD-10-CM

## 2013-07-21 DIAGNOSIS — R4689 Other symptoms and signs involving appearance and behavior: Secondary | ICD-10-CM

## 2013-07-21 DIAGNOSIS — IMO0001 Reserved for inherently not codable concepts without codable children: Secondary | ICD-10-CM

## 2013-07-21 DIAGNOSIS — F605 Obsessive-compulsive personality disorder: Secondary | ICD-10-CM

## 2013-07-21 DIAGNOSIS — F411 Generalized anxiety disorder: Secondary | ICD-10-CM

## 2013-07-21 MED ORDER — SERTRALINE HCL 50 MG PO TABS: 50.0000 mg | ORAL_TABLET | Freq: Every day | ORAL | Status: DC

## 2013-07-21 NOTE — Progress Notes (Signed)
  Subjective:    Patient ID: Vanessa Larson, female    DOB: 04-Jun-1981, 32 y.o.   MRN: [id number]  HPI Patient is a 32 yo female who presents to the clinic to establish care. PMH is positive for Depression/anxiety/OCD. Pt takes zoloft for the last 6 months and has improved her symptoms by 50 percent. She admits to still struggling with anxiety significantly. 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  Pt does not drink alcohol or smoke.  Pt uses condoms for birth control.   She is concerned about her abdomen extra skin. And wants to know what to do about it. She works out regularly 3-5 times a week.    Review of Systems  All other systems reviewed and are negative.       Objective:   Physical Exam  Constitutional: She is oriented to person, place, and time. She appears well-developed and well-nourished.  HENT:  Head: Normocephalic and atraumatic.  Cardiovascular: Normal rate, regular rhythm and normal heart sounds.   Pulmonary/Chest: Effort normal and breath sounds normal.  Abdominal: Soft. Bowel sounds are normal. There is no tenderness.  Loose skin over lower abdomen. Stretch marks present.   Neurological: She is alert and oriented to person, place, and time.  Skin: Skin is warm and dry.  Psychiatric: She has a normal mood and affect. Her behavior is normal.          Assessment & Plan:  Depression/OCD/anxiety- PHQ-9 was 7 and GAD-7 was 12. Discussed with patient could increase Zoloft to 100mg . Pt declined at this time. Will continue with counseling/exercise. Follow up in 6 months. Discussed she could increase to 100mg  on her on if wanted to try increase. Encouraged to at least give 4-6 weeks.  Abdominal skin- Discussed plastic surgery. Offered referral pt not ready. Has had 4 children. Could try nerium firming gel.   Pt declined flu shot.

## 2013-07-21 NOTE — Patient Instructions (Addendum)
Nerium Firming gel.

## 2013-11-05 ENCOUNTER — Encounter: Payer: Self-pay | Admitting: Physician Assistant

## 2013-11-05 ENCOUNTER — Ambulatory Visit (INDEPENDENT_AMBULATORY_CARE_PROVIDER_SITE_OTHER): Payer: BC Managed Care – PPO | Admitting: Physician Assistant

## 2013-11-05 VITALS — BP 111/72 | HR 76 | Wt 163.0 lb

## 2013-11-05 DIAGNOSIS — F3289 Other specified depressive episodes: Secondary | ICD-10-CM

## 2013-11-05 DIAGNOSIS — F411 Generalized anxiety disorder: Secondary | ICD-10-CM

## 2013-11-05 DIAGNOSIS — F410 Panic disorder [episodic paroxysmal anxiety] without agoraphobia: Secondary | ICD-10-CM

## 2013-11-05 DIAGNOSIS — R635 Abnormal weight gain: Secondary | ICD-10-CM

## 2013-11-05 DIAGNOSIS — F32A Depression, unspecified: Secondary | ICD-10-CM

## 2013-11-05 DIAGNOSIS — F329 Major depressive disorder, single episode, unspecified: Secondary | ICD-10-CM

## 2013-11-05 DIAGNOSIS — IMO0001 Reserved for inherently not codable concepts without codable children: Secondary | ICD-10-CM

## 2013-11-05 DIAGNOSIS — F605 Obsessive-compulsive personality disorder: Secondary | ICD-10-CM

## 2013-11-05 DIAGNOSIS — F419 Anxiety disorder, unspecified: Secondary | ICD-10-CM

## 2013-11-05 MED ORDER — BUPROPION HCL ER (XL) 150 MG PO TB24: 150.0000 mg | ORAL_TABLET | Freq: Every day | ORAL | Status: DC

## 2013-11-05 MED ORDER — CLONAZEPAM 0.5 MG PO TABS: 0.5000 mg | ORAL_TABLET | Freq: Two times a day (BID) | ORAL | Status: DC | PRN

## 2013-11-05 NOTE — Progress Notes (Signed)
   Subjective:    Patient ID: Vanessa Larson, female    DOB: 03-23-1981, 33 y.o.   MRN: [id number]  HPI Patient presens to the clinic to follow up on anxiety, depression, OCD. She is doing ok but still reports anxiety attacks and a lot of ongoing anxiety. Anxiety attacks consist of flushed feeling, heavy breathing, and feeling like she needs to leave. Pt has 4 children at home. She is now concerned that she has gained 17lbs in 6 months while breastfeeding 2 children. She exercises on a regular basis and doesn't eat great but tries to make better choices. Convinced is zoloft. She also noticed that she has no sex drive on zoloft.    Review of Systems     Objective:   Physical Exam  Constitutional: She is oriented to person, place, and time. She appears well-developed and well-nourished.  HENT:  Head: Normocephalic and atraumatic.  Cardiovascular: Normal rate, regular rhythm and normal heart sounds.   Pulmonary/Chest: Effort normal and breath sounds normal.  Neurological: She is alert and oriented to person, place, and time.  Skin: Skin is warm and dry.  Psychiatric: She has a normal mood and affect. Her behavior is normal.          Assessment & Plan:  Anxiety/Depression/OCD trait/panic attacks/weight gain- PHq-9 was 4. GAD-7 was 19. Clearing anxiety is what is not controlled today. Confirmed pt was 147 when started zoloft. Stop Zoloft. Could be causing weight gain and most likely causing libdo decrease. Start wellbutrin 150mg  in am. Give time to see if could work. Gave klonapin to try with anxiety attacks. Discussed to only use when needed. Discussed diet and exercise to continue. Follow up in 6 weeks.   Spent 30 minutes with patient and greater than 50 percent of visit discussing anxiety.

## 2013-11-05 NOTE — Patient Instructions (Signed)
Stop Zoloft. Wellbutrin 150mg  daily in the morning.  klonapin as needed.   Follow up in 6 weeks.

## 2013-11-06 DIAGNOSIS — F410 Panic disorder [episodic paroxysmal anxiety] without agoraphobia: Secondary | ICD-10-CM | POA: Insufficient documentation

## 2013-12-17 ENCOUNTER — Ambulatory Visit: Payer: BC Managed Care – PPO | Admitting: Physician Assistant

## 2014-01-14 ENCOUNTER — Ambulatory Visit (INDEPENDENT_AMBULATORY_CARE_PROVIDER_SITE_OTHER): Payer: BC Managed Care – PPO | Admitting: Physician Assistant

## 2014-01-14 ENCOUNTER — Encounter: Payer: Self-pay | Admitting: Physician Assistant

## 2014-01-14 VITALS — BP 116/79 | HR 79 | Wt 160.0 lb

## 2014-01-14 DIAGNOSIS — R51 Headache: Secondary | ICD-10-CM

## 2014-01-14 DIAGNOSIS — F3289 Other specified depressive episodes: Secondary | ICD-10-CM

## 2014-01-14 DIAGNOSIS — F419 Anxiety disorder, unspecified: Secondary | ICD-10-CM

## 2014-01-14 DIAGNOSIS — F329 Major depressive disorder, single episode, unspecified: Secondary | ICD-10-CM

## 2014-01-14 DIAGNOSIS — F411 Generalized anxiety disorder: Secondary | ICD-10-CM

## 2014-01-14 DIAGNOSIS — F32A Depression, unspecified: Secondary | ICD-10-CM

## 2014-01-14 MED ORDER — BUPROPION HCL ER (XL) 150 MG PO TB24: ORAL_TABLET | ORAL | Status: DC

## 2014-01-14 MED ORDER — AMOXICILLIN 500 MG PO TABS: 500.0000 mg | ORAL_TABLET | Freq: Two times a day (BID) | ORAL | Status: DC

## 2014-01-15 NOTE — Progress Notes (Signed)
   Subjective:    Patient ID: Vanessa Larson, female    DOB: Feb 25, 1981, 33 y.o.   MRN: [id number]  HPI Pt presents to the clinic to follow up on anxiety/depression/OCD and medication. She is much improved after addition of wellbutrin. She has 4 children and is aware there will be a certain amount of stress. She feels like she has plenty of energy and helps her to stay focused. She has not used klonapin yet. Only will use with extreme anxiety. She is sleeping well. She is exercising more and has lost 3lbs since last visit.   She mentions a HA everyday for last week on the way out the door. Worse with head position changes. Pain behind eyes and over forehead. Ibuprofen does help. No n/v/d. Somewhat light sensitive. Has long hair and pulls it up in a pony tail a lot. She admits to have URI sickness a little over 2 weeks ago. Still has a little bit of congestion.    Review of Systems     Objective:   Physical Exam  Constitutional: She is oriented to person, place, and time. She appears well-developed and well-nourished.  HENT:  Head: Normocephalic and atraumatic.  Cardiovascular: Normal rate, regular rhythm and normal heart sounds.   Pulmonary/Chest: Effort normal and breath sounds normal.  Neurological: She is alert and oriented to person, place, and time.  Psychiatric: She has a normal mood and affect. Her behavior is normal.          Assessment & Plan:  Anxiety/depression/OCD- GAD-7 was 11 down from 19. PHQ-9 was 4 down from 9. Discussed with pt since had such improvement with Wellbutrin can increase to 300mg  this could also help with weight loss as well. Follow up in 3 months.   Headaches- pt mentions this as walking out door. No physical exam was done. I am suspicious of sinusitis since she was recently URI sickness and pain is behind eyes and worse with head position is down. Gave amoxillicin to try for 10 days to see if relieved the pressure. I am also concerned some of it might be  due to tension. Suggested massage, cutting hair since long and heavy, warm compresses, and ibuprofen. Follow up if not resolving.

## 2014-04-27 ENCOUNTER — Other Ambulatory Visit: Payer: Self-pay | Admitting: *Deleted

## 2014-04-27 MED ORDER — BUPROPION HCL ER (XL) 150 MG PO TB24: ORAL_TABLET | ORAL | Status: DC

## 2014-04-29 ENCOUNTER — Encounter: Payer: Self-pay | Admitting: Physician Assistant

## 2014-04-29 ENCOUNTER — Other Ambulatory Visit: Payer: Self-pay | Admitting: Physician Assistant

## 2014-04-29 ENCOUNTER — Ambulatory Visit (INDEPENDENT_AMBULATORY_CARE_PROVIDER_SITE_OTHER): Payer: BC Managed Care – PPO | Admitting: Physician Assistant

## 2014-04-29 VITALS — BP 98/70 | HR 94 | Ht 66.0 in | Wt 149.0 lb

## 2014-04-29 DIAGNOSIS — F3289 Other specified depressive episodes: Secondary | ICD-10-CM

## 2014-04-29 DIAGNOSIS — F329 Major depressive disorder, single episode, unspecified: Secondary | ICD-10-CM

## 2014-04-29 DIAGNOSIS — F419 Anxiety disorder, unspecified: Secondary | ICD-10-CM

## 2014-04-29 DIAGNOSIS — F411 Generalized anxiety disorder: Secondary | ICD-10-CM

## 2014-04-29 DIAGNOSIS — F32A Depression, unspecified: Secondary | ICD-10-CM

## 2014-04-29 MED ORDER — BUPROPION HCL ER (XL) 300 MG PO TB24: 300.0000 mg | ORAL_TABLET | Freq: Every day | ORAL | Status: DC

## 2014-04-29 NOTE — Progress Notes (Signed)
   Subjective:    Patient ID: Vanessa Larson, female    DOB: Mar 17, 1981, 33 y.o.   MRN: [id number]  HPI Pt is a 33 yo female who presents to the clinic to follow up after increase of wellbutrin. She is doing great. Much better on increased dose. She feels much less anxious. She has days where she is a little stressed but overall happy and ready to tackle life. She has lost 11lbs and feeling much better about herself.    Review of Systems  All other systems reviewed and are negative.      Objective:   Physical Exam  Constitutional: She is oriented to person, place, and time. She appears well-developed and well-nourished.  HENT:  Head: Normocephalic and atraumatic.  Cardiovascular: Normal rate, regular rhythm and normal heart sounds.   Pulmonary/Chest: Effort normal and breath sounds normal. She has no wheezes.  Neurological: She is alert and oriented to person, place, and time.  Skin: Skin is dry.  Psychiatric: She has a normal mood and affect. Her behavior is normal.          Assessment & Plan:  Anxiety/depression- GAD-7 was 4. PHQ-9 was 5. Refilled wellbutrin 300mg  daily. At this point some amount of anxiety is going to come from having 4 kids under 5. Follow up in 6 months.

## 2014-08-31 ENCOUNTER — Encounter: Payer: Self-pay | Admitting: Physician Assistant

## 2014-10-15 ENCOUNTER — Ambulatory Visit (INDEPENDENT_AMBULATORY_CARE_PROVIDER_SITE_OTHER): Payer: BC Managed Care – PPO | Admitting: Obstetrics & Gynecology

## 2014-10-15 ENCOUNTER — Encounter: Payer: Self-pay | Admitting: Obstetrics & Gynecology

## 2014-10-15 VITALS — BP 105/62 | HR 76 | Resp 16 | Ht 66.0 in | Wt 156.0 lb

## 2014-10-15 DIAGNOSIS — R300 Dysuria: Secondary | ICD-10-CM

## 2014-10-15 DIAGNOSIS — O26851 Spotting complicating pregnancy, first trimester: Secondary | ICD-10-CM

## 2014-10-15 DIAGNOSIS — N939 Abnormal uterine and vaginal bleeding, unspecified: Secondary | ICD-10-CM | POA: Diagnosis not present

## 2014-10-15 DIAGNOSIS — Z3201 Encounter for pregnancy test, result positive: Secondary | ICD-10-CM | POA: Diagnosis not present

## 2014-10-15 LAB — POCT URINALYSIS DIPSTICK
BILIRUBIN UA: NEGATIVE
Glucose, UA: NEGATIVE
KETONES UA: NEGATIVE
Nitrite, UA: NEGATIVE
PH UA: 6
Protein, UA: NEGATIVE
Spec Grav, UA: 1.01
Urobilinogen, UA: 0.2

## 2014-10-15 LAB — POCT URINE PREGNANCY: Preg Test, Ur: POSITIVE

## 2014-10-15 NOTE — Progress Notes (Signed)
Pt c/o feeling pregnant and spotting.  No abdominal pain.  [facility]  UPT+ Bedside US shows IUP and +FH.  Adnexa clear.  Pt to take PNV and has first prenatal visit 8-9 weeks.  Pt will not get genetic testing.

## 2014-10-17 LAB — CULTURE, URINE COMPREHENSIVE
Colony Count: NO GROWTH
Organism ID, Bacteria: NO GROWTH

## 2014-10-26 ENCOUNTER — Ambulatory Visit (INDEPENDENT_AMBULATORY_CARE_PROVIDER_SITE_OTHER): Payer: BC Managed Care – PPO | Admitting: Advanced Practice Midwife

## 2014-10-26 ENCOUNTER — Encounter: Payer: Self-pay | Admitting: Advanced Practice Midwife

## 2014-10-26 VITALS — BP 116/72 | HR 101 | Wt 157.0 lb

## 2014-10-26 DIAGNOSIS — Z3481 Encounter for supervision of other normal pregnancy, first trimester: Secondary | ICD-10-CM

## 2014-10-26 DIAGNOSIS — O34219 Maternal care for unspecified type scar from previous cesarean delivery: Secondary | ICD-10-CM | POA: Insufficient documentation

## 2014-10-26 DIAGNOSIS — O3421 Maternal care for scar from previous cesarean delivery: Secondary | ICD-10-CM

## 2014-10-26 DIAGNOSIS — Z3480 Encounter for supervision of other normal pregnancy, unspecified trimester: Secondary | ICD-10-CM | POA: Insufficient documentation

## 2014-10-26 MED ORDER — DOXYLAMINE-PYRIDOXINE 10-10 MG PO TBEC: DELAYED_RELEASE_TABLET | ORAL | Status: DC

## 2014-10-26 MED ORDER — PROMETHAZINE HCL 25 MG PO TABS: 12.5000 mg | ORAL_TABLET | Freq: Four times a day (QID) | ORAL | Status: DC | PRN

## 2014-10-26 NOTE — Progress Notes (Signed)
   Subjective:    Vanessa Larson is a [id number] 9272w6d being seen today for her first obstetrical visit.  Her obstetrical history is significant for C/S x1 for twins, VBAC x 2 with waterbirth with next two pregnancies. Patient does intend to breast feed. Pregnancy history fully reviewed.  Patient reports nausea.  Filed Vitals:   10/26/14 1002  BP: 116/72  Pulse: 101  Weight: 157 lb (71.215 kg)    HISTORY: OB History  Gravida Para Term Preterm AB SAB TAB Ectopic Multiple Living  4 3 3      1 4     # Outcome Date GA Lbr Len/2nd Weight Sex Delivery Anes PTL Lv  4 Current           3 Term 01/27/13 3575w0d 03:13 / 00:38 8 lb 11.7 oz (3.96 kg) M Vag-Spont None  Y  2 Term 09/26/11 2220w3d 72:21 / 00:33 8 lb 6.8 oz (3.822 kg) F Vag-Spont None  Y     Comments: none  1A Term 03/27/09 5421w5d  6 lb 3 oz (2.807 kg) M CS-Unspec EPI N Y  1B Term 03/27/09 5121w5d  6 lb 7 oz (2.92 kg) M CS-Unspec EPI N Y     Past Medical History  Diagnosis Date  . Anxiety   . Depression   . Pregnancy related condition in third trimester     [redacted] weeks gestation complicated by depression/anxiety  . H/O abnormal Pap smear    Past Surgical History  Procedure Laterality Date  . Cesarean section     Family History  Problem Relation Age of Onset  . Depression Mother   . Stroke Maternal Grandmother   . Cancer Maternal Grandfather      Exam   Bedside ultrasound confirmed LMP dating of 8572w6d  Uterus:     Pelvic Exam: Deferred  System: Breast:  normal appearance, no masses or tenderness   Skin: normal coloration and turgor, no rashes    Neurologic: oriented, normal, gait normal; reflexes normal and symmetric   Extremities: normal strength, tone, and muscle mass, ROM of all joints is normal   HEENT neck supple with midline trachea and thyroid without masses   Mouth/Teeth mucous membranes moist, pharynx normal without lesions and dental hygiene good   Neck supple and no masses   Cardiovascular: regular rate and  rhythm   Respiratory:  appears well, vitals normal, no respiratory distress, acyanotic, normal RR, ear and throat exam is normal, neck free of mass or lymphadenopathy   Abdomen: soft, non-tender; bowel sounds normal; no masses,  no organomegaly   Urinary: not evaluated      Assessment:    Pregnancy: [id number] Patient Active Problem List   Diagnosis Date Noted  . Supervision of normal subsequent pregnancy 10/26/2014  . Anxiety attack 11/06/2013  . Anxiety 02/19/2012  . Depression 09/11/2011  . 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 Prenatal vitamins. Diclegis and Phenergan Rx to pharmacy  Problem list reviewed and updated. Genetic Screening discussed First Screen and Quad Screen: declined.  Ultrasound discussed; fetal survey: requested.  Follow up in 4 weeks. 50% of 30 min visit spent on counseling and coordination of care.     LEFTWICH-KIRBY, Fanny Agan 10/26/2014

## 2014-10-26 NOTE — Progress Notes (Signed)
IUP with FHT of 173  CRL measures 8 w 3 d

## 2014-10-27 LAB — OBSTETRIC PANEL
Antibody Screen: NEGATIVE
Basophils Absolute: 0 10*3/uL (ref 0.0–0.1)
Basophils Relative: 0 % (ref 0–1)
Eosinophils Absolute: 0.1 10*3/uL (ref 0.0–0.7)
Eosinophils Relative: 1 % (ref 0–5)
HCT: 36.7 % (ref 36.0–46.0)
Hemoglobin: 12.2 g/dL (ref 12.0–15.0)
Hepatitis B Surface Ag: NEGATIVE
Lymphocytes Relative: 18 % (ref 12–46)
Lymphs Abs: 1.3 10*3/uL (ref 0.7–4.0)
MCH: 27.4 pg (ref 26.0–34.0)
MCHC: 33.2 g/dL (ref 30.0–36.0)
MCV: 82.5 fL (ref 78.0–100.0)
MONO ABS: 0.4 10*3/uL (ref 0.1–1.0)
MPV: 9.9 fL (ref 9.4–12.4)
Monocytes Relative: 6 % (ref 3–12)
NEUTROS ABS: 5.5 10*3/uL (ref 1.7–7.7)
NEUTROS PCT: 75 % (ref 43–77)
PLATELETS: 237 10*3/uL (ref 150–400)
RBC: 4.45 MIL/uL (ref 3.87–5.11)
RDW: 14.6 % (ref 11.5–15.5)
Rh Type: POSITIVE
Rubella: 0.86 Index (ref <0.90)
WBC: 7.3 10*3/uL (ref 4.0–10.5)

## 2014-10-27 LAB — HIV ANTIBODY (ROUTINE TESTING W REFLEX): HIV 1&2 Ab, 4th Generation: NONREACTIVE

## 2014-11-23 ENCOUNTER — Ambulatory Visit (INDEPENDENT_AMBULATORY_CARE_PROVIDER_SITE_OTHER): Payer: BLUE CROSS/BLUE SHIELD | Admitting: Advanced Practice Midwife

## 2014-11-23 VITALS — BP 104/70 | HR 96 | Wt 157.0 lb

## 2014-11-23 DIAGNOSIS — O021 Missed abortion: Secondary | ICD-10-CM

## 2014-11-25 ENCOUNTER — Encounter: Payer: Self-pay | Admitting: Advanced Practice Midwife

## 2014-11-25 NOTE — Progress Notes (Signed)
Attempted to auscultate FHR without success. US done by Caprice RedL Clark RN showed demise at 9 week size. Discussed options of expectant management vs cytotec vs D&E. She prefers to wait for now. Will check weekly quants if not delivered by next week.

## 2014-11-25 NOTE — Patient Instructions (Signed)
Incomplete Miscarriage A miscarriage is the sudden loss of an unborn baby (fetus) before the 20th week of pregnancy. In an incomplete miscarriage, parts of the fetus or placenta (afterbirth) remain in the body.  Having a miscarriage can be an emotional experience. Talk with your health care provider about any questions you may have about miscarrying, the grieving process, and your future pregnancy plans. CAUSES   Problems with the fetal chromosomes that make it impossible for the baby to develop normally. Problems with the baby's genes or chromosomes are most often the result of errors that occur by chance as the embryo divides and grows. The problems are not inherited from the parents.  Infection of the cervix or uterus.  Hormone problems.  Problems with the cervix, such as having an incompetent cervix. This is when the tissue in the cervix is not strong enough to hold the pregnancy.  Problems with the uterus, such as an abnormally shaped uterus, uterine fibroids, or congenital abnormalities.  Certain medical conditions.  Smoking, drinking alcohol, or taking illegal drugs.  Trauma. SYMPTOMS   Vaginal bleeding or spotting, with or without cramps or pain.  Pain or cramping in the abdomen or lower back.  Passing fluid, tissue, or blood clots from the vagina. DIAGNOSIS  Your health care provider will perform a physical exam. You may also have an ultrasound to confirm the miscarriage. Blood or urine tests may also be ordered. TREATMENT   Usually, a dilation and curettage (D&C) procedure is performed. During a D&C procedure, the cervix is widened (dilated) and any remaining fetal or placental tissue is gently removed from the uterus.  Antibiotic medicines are prescribed if there is an infection. Other medicines may be given to reduce the size of the uterus (contract) if there is a lot of bleeding.  If you have Rh negative blood and your baby was Rh positive, you will need a Rho (D)  immune globulin shot. This shot will protect any future baby from having Rh blood problems in future pregnancies.  You may be confined to bed rest. This means you should stay in bed and only get up to use the bathroom. HOME CARE INSTRUCTIONS   Rest as directed by your health care provider.  Restrict activity as directed by your health care provider. You may be allowed to continue light activity if curettage was not done but you require further treatment.  Keep track of the number of pads you use each day. Keep track of how soaked (saturated) they are. Record this information.  Do not  use tampons.  Do not douche or have sexual intercourse until approved by your health care provider.  Keep all follow-up appointments for reevaluation and continuing management.  Only take over-the-counter or prescription medicines for pain, fever, or discomfort as directed by your health care provider.  Take antibiotic medicine as directed by your health care provider. Make sure you finish it even if you start to feel better. SEEK IMMEDIATE MEDICAL CARE IF:   You experience severe cramps in your stomach, back, or abdomen.  You have an unexplained temperature (make sure to record these temperatures).  You pass large clots or tissue (save these for your health care provider to inspect).  Your bleeding increases.  You become light-headed, weak, or have fainting episodes. MAKE SURE YOU:   Understand these instructions.  Will watch your condition.  Will get help right away if you are not doing well or get worse. Document Released: 10/16/2005 Document Revised: 03/02/2014 Document Reviewed:   05/15/2013 ExitCare Patient Information 2015 [employer]. This information is not intended to replace advice given to you by your health care provider. Make sure you discuss any questions you have with your health care provider.  

## 2014-11-30 ENCOUNTER — Telehealth: Payer: Self-pay | Admitting: *Deleted

## 2014-11-30 ENCOUNTER — Ambulatory Visit (INDEPENDENT_AMBULATORY_CARE_PROVIDER_SITE_OTHER): Payer: BLUE CROSS/BLUE SHIELD | Admitting: Advanced Practice Midwife

## 2014-11-30 ENCOUNTER — Encounter (HOSPITAL_COMMUNITY): Payer: Self-pay | Admitting: *Deleted

## 2014-11-30 ENCOUNTER — Encounter: Payer: Self-pay | Admitting: Advanced Practice Midwife

## 2014-11-30 VITALS — BP 110/78 | HR 88 | Ht 64.0 in | Wt 158.0 lb

## 2014-11-30 DIAGNOSIS — O021 Missed abortion: Secondary | ICD-10-CM

## 2014-11-30 DIAGNOSIS — O039 Complete or unspecified spontaneous abortion without complication: Secondary | ICD-10-CM

## 2014-11-30 MED ORDER — MISOPROSTOL 200 MCG PO TABS: ORAL_TABLET | ORAL | Status: DC

## 2014-11-30 MED ORDER — OXYCODONE-ACETAMINOPHEN 5-325 MG PO TABS: 1.0000 | ORAL_TABLET | ORAL | Status: DC | PRN

## 2014-11-30 NOTE — Progress Notes (Signed)
   Subjective:    Patient ID: Vanessa Larson, female    DOB: 25-Mar-1981, 34 y.o.   MRN: [id number]  HPI; Dx'd w/ Missed AB 1 week ago. Measuring 9 weeks. Opted for expectant management. Light bleeding and mild cramping since then. No passage of tissue of clots.   Review of Systems See HPI. Neg for fever, chills, abd tenderness.     Objective:   Physical Exam Informal B US shows 9 weeks fetus w/out cardiac activity.  Small amount of brown vaginal bleeding.     Assessment & Plan:  1. Missed abortion  PLAN Pt would like to intervene. Lengthy discussion about R/B of cytotec vs D&E. Pt elects D&E. Scheduled for tomorrow w/ Dr. Shawnie PonsPratt. NPO after MN Support given CBC F/U 1 week MAU PRN for emergencies  Vanessa Larson, CNM 11/30/2014 12:06 PM

## 2014-11-30 NOTE — Patient Instructions (Signed)
Dilation and Curettage or Vacuum Curettage Dilation and curettage (D&C) and vacuum curettage are minor procedures. A D&C involves stretching (dilation) the cervix and scraping (curettage) the inside lining of the womb (uterus). During a D&C, tissue is gently scraped from the inside lining of the uterus. During a vacuum curettage, the lining and tissue in the uterus are removed with the use of gentle suction.  Curettage may be performed to either diagnose or treat a problem. As a diagnostic procedure, curettage is performed to examine tissues from the uterus. A diagnostic curettage may be performed for the following symptoms:   Irregular bleeding in the uterus.   Bleeding with the development of clots.   Spotting between menstrual periods.   Prolonged menstrual periods.   Bleeding after menopause.   No menstrual period (amenorrhea).   A change in size and shape of the uterus.  As a treatment procedure, curettage may be performed for the following reasons:   Removal of an IUD (intrauterine device).   Removal of retained placenta after giving birth. Retained placenta can cause an infection or bleeding severe enough to require transfusions.   Abortion.   Miscarriage.   Removal of polyps inside the uterus.   Removal of uncommon types of noncancerous lumps (fibroids).  LET YOUR HEALTH CARE PROVIDER KNOW ABOUT:   Any allergies you have.   All medicines you are taking, including vitamins, herbs, eye drops, creams, and over-the-counter medicines.   Previous problems you or members of your family have had with the use of anesthetics.   Any blood disorders you have.   Previous surgeries you have had.   Medical conditions you have. RISKS AND COMPLICATIONS  Generally, this is a safe procedure. However, as with any procedure, complications can occur. Possible complications include:  Excessive bleeding.   Infection of the uterus.   Damage to the cervix.    Development of scar tissue (adhesions) inside the uterus, later causing abnormal amounts of menstrual bleeding.   Complications from the general anesthetic, if a general anesthetic is used.   Putting a hole (perforation) in the uterus. This is rare.  BEFORE THE PROCEDURE   Eat and drink before the procedure only as directed by your health care provider.   Arrange for someone to take you home.  PROCEDURE  This procedure usually takes about 15-30 minutes.  You will be given one of the following:  A medicine that numbs the area in and around the cervix (local anesthetic).   A medicine to make you sleep through the procedure (general anesthetic).  You will lie on your back with your legs in stirrups.   A warm metal or plastic instrument (speculum) will be placed in your vagina to keep it open and to allow the health care provider to see the cervix.  There are two ways in which your cervix can be softened and dilated. These include:   Taking a medicine.   Having thin rods (laminaria) inserted into your cervix.   A curved tool (curette) will be used to scrape cells from the inside lining of the uterus. In some cases, gentle suction is applied with the curette. The curette will then be removed.  AFTER THE PROCEDURE   You will rest in the recovery area until you are stable and are ready to go home.   You may feel sick to your stomach (nauseous) or throw up (vomit) if you were given a general anesthetic.   You may have a sore throat if a tube   was placed in your throat during general anesthesia.   You may have light cramping and bleeding. This may last for 2 days to 2 weeks after the procedure.   Your uterus needs to make a new lining after the procedure. This may make your next period late. Document Released: 10/16/2005 Document Revised: 06/18/2013 Document Reviewed: 05/15/2013 ExitCare Patient Information 2015 [employer]. This information is not intended to  replace advice given to you by your health care provider. Make sure you discuss any questions you have with your health care provider.  

## 2014-11-30 NOTE — Telephone Encounter (Signed)
Pt called stating that she was wanting to go ahead and try Cytotec tonight and see if she could go ahead and pass the tissue.  Spoke with Dr Marice Potterove who ok'd her to have Cytotec 600 mg vaginally and RX for Percocet.  If patient passes the tissue before her surgery she wants to cancel it.  She had a recent CBC and her blood type is A Positive.

## 2014-12-01 ENCOUNTER — Inpatient Hospital Stay (HOSPITAL_COMMUNITY): Payer: BLUE CROSS/BLUE SHIELD

## 2014-12-01 ENCOUNTER — Encounter (HOSPITAL_COMMUNITY): Payer: Self-pay | Admitting: *Deleted

## 2014-12-01 ENCOUNTER — Observation Stay (HOSPITAL_COMMUNITY)
Admission: AD | Admit: 2014-12-01 | Discharge: 2014-12-02 | Disposition: A | Discharge status: Home / Self Care | Payer: BLUE CROSS/BLUE SHIELD | Source: Ambulatory Visit | Attending: Family Medicine | Admitting: Family Medicine

## 2014-12-01 ENCOUNTER — Ambulatory Visit (HOSPITAL_COMMUNITY)
Admission: RE | Admit: 2014-12-01 | Payer: BLUE CROSS/BLUE SHIELD | Source: Ambulatory Visit | Admitting: Family Medicine

## 2014-12-01 ENCOUNTER — Encounter (HOSPITAL_COMMUNITY)
Admission: AD | Disposition: A | Discharge status: Home / Self Care | Payer: Self-pay | Source: Ambulatory Visit | Attending: Family Medicine

## 2014-12-01 DIAGNOSIS — I951 Orthostatic hypotension: Secondary | ICD-10-CM | POA: Diagnosis present

## 2014-12-01 DIAGNOSIS — O039 Complete or unspecified spontaneous abortion without complication: Secondary | ICD-10-CM | POA: Diagnosis not present

## 2014-12-01 DIAGNOSIS — Z3481 Encounter for supervision of other normal pregnancy, first trimester: Secondary | ICD-10-CM

## 2014-12-01 DIAGNOSIS — O036 Delayed or excessive hemorrhage following complete or unspecified spontaneous abortion: Secondary | ICD-10-CM | POA: Diagnosis not present

## 2014-12-01 DIAGNOSIS — D62 Acute posthemorrhagic anemia: Secondary | ICD-10-CM | POA: Diagnosis present

## 2014-12-01 DIAGNOSIS — O34219 Maternal care for unspecified type scar from previous cesarean delivery: Secondary | ICD-10-CM

## 2014-12-01 DIAGNOSIS — O209 Hemorrhage in early pregnancy, unspecified: Secondary | ICD-10-CM

## 2014-12-01 LAB — CBC
HCT: 37 % (ref 36.0–46.0)
HCT: 33.7 % — ABNORMAL LOW (ref 36.0–46.0)
HCT: 29.9 % — ABNORMAL LOW (ref 36.0–46.0)
HEMATOCRIT: 24.6 % — AB (ref 36.0–46.0)
HEMOGLOBIN: 11.4 g/dL — AB (ref 12.0–15.0)
Hemoglobin: 12.2 g/dL (ref 12.0–15.0)
Hemoglobin: 10.4 g/dL — ABNORMAL LOW (ref 12.0–15.0)
Hemoglobin: 8.4 g/dL — ABNORMAL LOW (ref 12.0–15.0)
MCH: 28 pg (ref 26.0–34.0)
MCH: 28.6 pg (ref 26.0–34.0)
MCH: 29.1 pg (ref 26.0–34.0)
MCH: 28.8 pg (ref 26.0–34.0)
MCHC: 33 g/dL (ref 30.0–36.0)
MCHC: 33.8 g/dL (ref 30.0–36.0)
MCHC: 34.8 g/dL (ref 30.0–36.0)
MCHC: 34.1 g/dL (ref 30.0–36.0)
MCV: 85.1 fL (ref 78.0–100.0)
MCV: 84.5 fL (ref 78.0–100.0)
MCV: 83.5 fL (ref 78.0–100.0)
MCV: 84.2 fL (ref 78.0–100.0)
MPV: 9.7 fL (ref 8.6–12.4)
PLATELETS: 184 10*3/uL (ref 150–400)
Platelets: 210 10*3/uL (ref 150–400)
Platelets: 186 10*3/uL (ref 150–400)
Platelets: 165 10*3/uL (ref 150–400)
RBC: 4.35 MIL/uL (ref 3.87–5.11)
RBC: 3.99 MIL/uL (ref 3.87–5.11)
RBC: 3.58 MIL/uL — ABNORMAL LOW (ref 3.87–5.11)
RBC: 2.92 MIL/uL — AB (ref 3.87–5.11)
RDW: 15.3 % (ref 11.5–15.5)
RDW: 13.4 % (ref 11.5–15.5)
RDW: 13.4 % (ref 11.5–15.5)
RDW: 13.7 % (ref 11.5–15.5)
WBC: 5.4 10*3/uL (ref 4.0–10.5)
WBC: 11.7 10*3/uL — ABNORMAL HIGH (ref 4.0–10.5)
WBC: 12 10*3/uL — ABNORMAL HIGH (ref 4.0–10.5)
WBC: 7.4 10*3/uL (ref 4.0–10.5)

## 2014-12-01 LAB — CBC WITH DIFFERENTIAL/PLATELET
BASOS ABS: 0 10*3/uL (ref 0.0–0.1)
Basophils Relative: 0 % (ref 0–1)
Eosinophils Absolute: 0 10*3/uL (ref 0.0–0.7)
Eosinophils Relative: 1 % (ref 0–5)
HEMATOCRIT: 25.3 % — AB (ref 36.0–46.0)
Hemoglobin: 8.8 g/dL — ABNORMAL LOW (ref 12.0–15.0)
LYMPHS PCT: 16 % (ref 12–46)
Lymphs Abs: 1.2 10*3/uL (ref 0.7–4.0)
MCH: 29.2 pg (ref 26.0–34.0)
MCHC: 34.8 g/dL (ref 30.0–36.0)
MCV: 84.1 fL (ref 78.0–100.0)
MONOS PCT: 6 % (ref 3–12)
Monocytes Absolute: 0.5 10*3/uL (ref 0.1–1.0)
NEUTROS ABS: 6.2 10*3/uL (ref 1.7–7.7)
NEUTROS PCT: 77 % (ref 43–77)
Platelets: 161 10*3/uL (ref 150–400)
RBC: 3.01 MIL/uL — AB (ref 3.87–5.11)
RDW: 13.7 % (ref 11.5–15.5)
WBC: 7.9 10*3/uL (ref 4.0–10.5)

## 2014-12-01 SURGERY — DILATION AND EVACUATION, UTERUS
Anesthesia: Choice | Site: Vagina

## 2014-12-01 MED ORDER — ALUM & MAG HYDROXIDE-SIMETH 200-200-20 MG/5ML PO SUSP: 30.0000 mL | ORAL | Status: DC | PRN

## 2014-12-01 MED ORDER — LACTATED RINGERS IV BOLUS (SEPSIS)
1000.0000 mL | Freq: Once | INTRAVENOUS | Status: AC
  Administered 2014-12-01: 1000 mL via INTRAVENOUS

## 2014-12-01 MED ORDER — ONDANSETRON HCL 4 MG PO TABS: 4.0000 mg | ORAL_TABLET | Freq: Four times a day (QID) | ORAL | Status: DC | PRN

## 2014-12-01 MED ORDER — DEXTROSE IN LACTATED RINGERS 5 % IV SOLN
INTRAVENOUS | Status: DC
  Administered 2014-12-01 – 2014-12-02 (×4): via INTRAVENOUS

## 2014-12-01 MED ORDER — MENTHOL 3 MG MT LOZG: 1.0000 | LOZENGE | OROMUCOSAL | Status: DC | PRN

## 2014-12-01 MED ORDER — LACTATED RINGERS IV SOLN
Freq: Once | INTRAVENOUS | Status: AC
  Administered 2014-12-01: 01:00:00 via INTRAVENOUS

## 2014-12-01 MED ORDER — OXYCODONE-ACETAMINOPHEN 5-325 MG PO TABS: 1.0000 | ORAL_TABLET | ORAL | Status: DC | PRN

## 2014-12-01 MED ORDER — GUAIFENESIN 100 MG/5ML PO SOLN: 15.0000 mL | ORAL | Status: DC | PRN

## 2014-12-01 MED ORDER — DEXTROSE IN LACTATED RINGERS 5 % IV SOLN
INTRAVENOUS | Status: DC
  Administered 2014-12-01: 02:00:00 via INTRAVENOUS

## 2014-12-01 MED ORDER — ONDANSETRON HCL 4 MG/2ML IJ SOLN
4.0000 mg | Freq: Four times a day (QID) | INTRAMUSCULAR | Status: DC | PRN
  Administered 2014-12-02: 4 mg via INTRAVENOUS
  Filled 2014-12-01: qty 2

## 2014-12-01 MED ORDER — IBUPROFEN 600 MG PO TABS: 600.0000 mg | ORAL_TABLET | Freq: Four times a day (QID) | ORAL | Status: DC | PRN

## 2014-12-01 SURGICAL SUPPLY — 18 items
CATH ROBINSON RED A/P 16FR (CATHETERS) ×3 IMPLANT
CLOTH BEACON ORANGE TIMEOUT ST (SAFETY) ×3 IMPLANT
DECANTER SPIKE VIAL GLASS SM (MISCELLANEOUS) ×3 IMPLANT
GLOVE BIOGEL PI IND STRL 7.0 (GLOVE) ×1 IMPLANT
GLOVE BIOGEL PI INDICATOR 7.0 (GLOVE) ×2
GLOVE ECLIPSE 7.0 STRL STRAW (GLOVE) ×6 IMPLANT
GOWN STRL REUS W/TWL LRG LVL3 (GOWN DISPOSABLE) ×9 IMPLANT
KIT BERKELEY 1ST TRIMESTER 3/8 (MISCELLANEOUS) ×3 IMPLANT
NS IRRIG 1000ML POUR BTL (IV SOLUTION) ×3 IMPLANT
PACK VAGINAL MINOR WOMEN LF (CUSTOM PROCEDURE TRAY) ×3 IMPLANT
PAD OB MATERNITY 4.3X12.25 (PERSONAL CARE ITEMS) ×3 IMPLANT
PAD PREP 24X48 CUFFED NSTRL (MISCELLANEOUS) ×3 IMPLANT
SET BERKELEY SUCTION TUBING (SUCTIONS) ×3 IMPLANT
TOWEL OR 17X24 6PK STRL BLUE (TOWEL DISPOSABLE) ×6 IMPLANT
VACURETTE 10 RIGID CVD (CANNULA) IMPLANT
VACURETTE 7MM CVD STRL WRAP (CANNULA) IMPLANT
VACURETTE 8 RIGID CVD (CANNULA) IMPLANT
VACURETTE 9 RIGID CVD (CANNULA) IMPLANT

## 2014-12-01 NOTE — MAU Note (Signed)
Pt wanting to get up to BR, BP when sitting 105/48, HR 137.  Pt placed on bedpan.

## 2014-12-01 NOTE — MAU Provider Note (Signed)
History     CSN: [id number]  Arrival date and time: 12/01/14 0036   None     No chief complaint on file.  HPI This is a 34 y.o. female who is s/p missed abortion. She was believed to be 14 weeks but was found to have a missed abortion of 9 wks size. She had elected to have a D&E this morning. She later decided to try Cytotec passed the fetus (has a picture of intact fetus) then had heavy bleeding and began to feel dizzy.    RN Note: PT SAYS SHE WENT TO DR THIS AM - GAVE HER CYTOTEC - WENT HOME INSERTED VAG AT 7 PM- THEN BLEEDING BECAME HEAVY AT 9PM. ON ARRIVAL- BROUGHT TO RM 7 VIA W/C.           OB History    Gravida Para Term Preterm AB TAB SAB Ectopic Multiple Living   4 3 3      1 4       Past Medical History  Diagnosis Date  . Anxiety   . Depression   . Pregnancy related condition in third trimester 2010    [redacted] weeks gestation complicated by depression/anxiety  . H/O abnormal Pap smear     Past Surgical History  Procedure Laterality Date  . Cesarean section  2010    x 1  . Wisdom tooth extraction      Family History  Problem Relation Age of Onset  . Depression Mother   . Stroke Maternal Grandmother   . Cancer Maternal Grandfather     History  Substance Use Topics  . Smoking status: Never Smoker   . Smokeless tobacco: Never Used  . Alcohol Use: No    Allergies: No Known Allergies  Prescriptions prior to admission  Medication Sig Dispense Refill Last Dose  . buPROPion (WELLBUTRIN XL) 300 MG 24 hr tablet Take 1 tablet (300 mg total) by mouth daily. 90 tablet 1 Taking  . Doxylamine-Pyridoxine (DICLEGIS) 10-10 MG TBEC Take 2 tabls at bedtime.  If needed, add another tab in the morning and another in the afternoon. You may take up to 4 tablets/day 100 tablet 5 Taking  . misoprostol (CYTOTEC) 200 MCG tablet Place 3 tabs into vaginal now 3 tablet 0   . oxyCODONE-acetaminophen (PERCOCET/ROXICET) 5-325 MG per tablet Take 1 tablet  by mouth every 4 (four) hours as needed for severe pain. 20 tablet 0   . Prenatal Vit-Fe Fumarate-FA (PRENATAL MULTIVITAMIN) TABS Take 1 tablet by mouth daily at 12 noon.   Taking  . promethazine (PHENERGAN) 25 MG tablet Take 0.5-1 tablets (12.5-25 mg total) by mouth every 6 (six) hours as needed for nausea. 30 tablet 2 Taking    Review of Systems  Constitutional: Positive for malaise/fatigue. Negative for fever and chills.  Gastrointestinal: Positive for abdominal pain (only mild cramps now). Negative for nausea and vomiting.  Genitourinary:       Light to moderate bleeding   Neurological: Positive for dizziness and weakness.   Physical Exam   Blood pressure 75/56, pulse 80, temperature 97.9 F (36.6 C), temperature source Oral, resp. rate 20, last menstrual period 08/25/2014, currently breastfeeding.  Physical Exam  Constitutional: She is oriented to person, place, and time. She appears well-developed and well-nourished. No distress.  Cardiovascular: Normal rate, regular rhythm and normal heart sounds.  Exam reveals no gallop and no friction rub.   No murmur heard. Respiratory: Effort normal.  GI: Soft. She exhibits no distension and no mass. There  is no tenderness. There is no rebound and no guarding.  Genitourinary: Vaginal discharge (light to mod blood) found.  Musculoskeletal: Normal range of motion.  Neurological: She is alert and oriented to person, place, and time.  Skin: Skin is warm and dry.  Psychiatric: She has a normal mood and affect.    MAU Course  Procedures  MDM IV fluids given to help dizziness. [place] ordered with CBC US Ob Comp Less 14 Wks  12/01/2014   CLINICAL DATA:  Recent abortion, with heavy bleeding. Assess for retained products of conception. Initial encounter.  EXAM: OBSTETRIC <14 WK [place] AND TRANSVAGINAL OB US  TECHNIQUE: Both transabdominal and transvaginal ultrasound examinations were performed for complete evaluation of the gestation as well as the  maternal uterus, adnexal regions, and pelvic cul-de-sac. Transvaginal technique was performed to assess early pregnancy.  COMPARISON:  None.  FINDINGS: Intrauterine gestational sac:  None seen.  Yolk sac:  N/A  Embryo:  N/A  Maternal uterus/adnexae: The endometrial echo complex is thickened, measuring 1.6 cm, likely reflecting some amount of blood and clot. No associated blood flow is seen on limited color Doppler evaluation to suggest retained products of conception.  The ovaries are unremarkable in appearance. The right ovary measures 2.7 x 1.9 x 1.7 cm, while the left ovary measures 4.1 x 2.8 x 2.2 cm. No suspicious adnexal masses are seen; there is no evidence for ovarian torsion.  Trace free fluid is seen within the pelvic cul-de-sac.  IMPRESSION: No evidence for retained products of conception. Thickening of the endometrial echo complex likely reflect some degree of underlying blood and clot. Uterus otherwise grossly unremarkable.   Electronically Signed   By: Roanna Raider M.D.   On: 12/01/2014 01:48   US Ob Transvaginal  12/01/2014   CLINICAL DATA:  Recent abortion, with heavy bleeding. Assess for retained products of conception. Initial encounter.  EXAM: OBSTETRIC <14 WK [place] AND TRANSVAGINAL OB US  TECHNIQUE: Both transabdominal and transvaginal ultrasound examinations were performed for complete evaluation of the gestation as well as the maternal uterus, adnexal regions, and pelvic cul-de-sac. Transvaginal technique was performed to assess early pregnancy.  COMPARISON:  None.  FINDINGS: Intrauterine gestational sac:  None seen.  Yolk sac:  N/A  Embryo:  N/A  Maternal uterus/adnexae: The endometrial echo complex is thickened, measuring 1.6 cm, likely reflecting some amount of blood and clot. No associated blood flow is seen on limited color Doppler evaluation to suggest retained products of conception.  The ovaries are unremarkable in appearance. The right ovary measures 2.7 x 1.9 x 1.7 cm, while the left  ovary measures 4.1 x 2.8 x 2.2 cm. No suspicious adnexal masses are seen; there is no evidence for ovarian torsion.  Trace free fluid is seen within the pelvic cul-de-sac.  IMPRESSION: No evidence for retained products of conception. Thickening of the endometrial echo complex likely reflect some degree of underlying blood and clot. Uterus otherwise grossly unremarkable.   Electronically Signed   By: Roanna Raider M.D.   On: 12/01/2014 01:48   Results for orders placed or performed during the hospital encounter of 12/01/14 (from the past 24 hour(s))  CBC     Status: Abnormal   Collection Time: 12/01/14  1:05 AM  Result Value Ref Range   WBC 11.7 (H) 4.0 - 10.5 K/uL   RBC 3.99 3.87 - 5.11 MIL/uL   Hemoglobin 11.4 (L) 12.0 - 15.0 g/dL   HCT 16.1 (L) 09.6 - 04.5 %   MCV 84.5  78.0 - 100.0 fL   MCH 28.6 26.0 - 34.0 pg   MCHC 33.8 30.0 - 36.0 g/dL   RDW 16.1 09.6 - 04.5 %   Platelets 210 150 - 400 K/uL   Felt better after 2 liters of fluid, though is still somewhat dizzy (but not orthostatic).  Assessment and Plan  A:  Missed abortion, now complete      Dizziness, possibly vagal  P;  Discussed with Dr Jolayne Panther       Cancel D&E       Bleeding precautions       PO hydration and rest       F/U in office 2 wks or PRN  [facility] 12/01/2014, 12:48 AM   Upon trying to discharge her, she fainted and began vomiting. Her BP dropped to 60s systolic and heartrate increased I will redraw another CBC to check Hgb. Will restart IV fluids   0755:  Still orthostatic, tachycardic to 136 and BP in 90s with sitting only            Consulted Dr Jolayne Panther             Will start 5th liter and redraw CBC .           Report to oncoming provider  Aviva Signs, CNM  H/H at 8:10am 8.8 and 25.4.  She continues to be orthostatic.   She is given food and drink.  At 11:30am, orthostatics rechecked.  She is able to stand briefly but pulse rose to 179 and BP dropped to 72/37.   Dr. Shawnie Pons consulted and  advises for admission to 3rd floor for observation.  She will enter orders.    PLAN: Admit to 3rd floor

## 2014-12-01 NOTE — MAU Note (Signed)
PT  SAYS SHE WENT  TO DR  THIS  AM  - GAVE  HER CYTOTEC    -  WENT  HOME  INSERTED VAG   AT 7 PM-  THEN BLEEDING  BECAME  HEAVY  AT 9PM.    ON ARRIVAL-  BROUGHT  TO RM  7  VIA  W/C.

## 2014-12-01 NOTE — Progress Notes (Signed)
Pt became dizzy and BP dropped to 66/50 upon standing during Orthostatic BP assessment. Pt layed back into bed to rest and feels better while sitting. Wynelle BourgeoisMarie Williams, CNM notified. Orders to follow.

## 2014-12-01 NOTE — MAU Note (Signed)
Pt tolerated meal & fluids, IVF infusing.  Will recheck orthostatic VS within the next hour.  Pt states she continues to have nausea & dizziness when she sits up, has continued to use bedpan.

## 2014-12-01 NOTE — MAU Note (Signed)
Pt has tolerated cranberry juice, states she is only nauseated when sitting up.  Toast, bacon, & applesauce ordered.

## 2014-12-01 NOTE — MAU Note (Signed)
Pt attempted to ambulate and became dizzy. Pt laying down at this time and will attempt ambulation when feeling better.

## 2014-12-01 NOTE — Progress Notes (Signed)
Pt up to wheelchair to go to bathroom. Pt passed out in bathroom while sitting in wheelchair. Pt wheeled back to bed and pt vomited moderate amount. Wynelle BourgeoisMarie Williams, CNM at bedside. Patient assisted to bed to lay down.

## 2014-12-01 NOTE — H&P (Signed)
History     CSN: [id number]  Arrival date and time: 12/01/14 0036  None    No chief complaint on file.  HPI This is a 34 y.o. female who is s/p missed abortion. She was believed to be 14 weeks but was found to have a missed abortion of 9 wks size. She had elected to have a D&E this morning. She later decided to try Cytotec passed the fetus (has a picture of intact fetus) then had heavy bleeding and began to feel dizzy.  Since coming to MAU patient has continued to be orthostatic despite 5 liters of fluid and eating and drinking.  Her H&H has dropped some and was last found to be 8.8/25.4 at 8:10 this am.  She is not actively bleeding.   RN Note: PT SAYS SHE WENT TO DR THIS AM - GAVE HER CYTOTEC - WENT HOME INSERTED VAG AT 7 PM- THEN BLEEDING BECAME HEAVY AT 9PM. ON ARRIVAL- BROUGHT TO RM 7 VIA W/C.           OB History    Gravida Para Term Preterm AB TAB SAB Ectopic Multiple Living   4 3 3      1 4       Past Medical History  Diagnosis Date  . Anxiety   . Depression   . Pregnancy related condition in third trimester 2010    [redacted] weeks gestation complicated by depression/anxiety  . H/O abnormal Pap smear     Past Surgical History  Procedure Laterality Date  . Cesarean section  2010    x 1  . Wisdom tooth extraction      Family History  Problem Relation Age of Onset  . Depression Mother   . Stroke Maternal Grandmother   . Cancer Maternal Grandfather     History  Substance Use Topics  . Smoking status: Never Smoker   . Smokeless tobacco: Never Used  . Alcohol Use: No    Allergies: No Known Allergies  Prescriptions prior to admission  Medication Sig Dispense Refill Last Dose  . buPROPion (WELLBUTRIN XL) 300 MG 24 hr tablet Take 1 tablet (300 mg total) by mouth daily. 90 tablet 1 Taking  . Doxylamine-Pyridoxine  (DICLEGIS) 10-10 MG TBEC Take 2 tabls at bedtime. If needed, add another tab in the morning and another in the afternoon. You may take up to 4 tablets/day 100 tablet 5 Taking  . misoprostol (CYTOTEC) 200 MCG tablet Place 3 tabs into vaginal now 3 tablet 0   . oxyCODONE-acetaminophen (PERCOCET/ROXICET) 5-325 MG per tablet Take 1 tablet by mouth every 4 (four) hours as needed for severe pain. 20 tablet 0   . Prenatal Vit-Fe Fumarate-FA (PRENATAL MULTIVITAMIN) TABS Take 1 tablet by mouth daily at 12 noon.   Taking  . promethazine (PHENERGAN) 25 MG tablet Take 0.5-1 tablets (12.5-25 mg total) by mouth every 6 (six) hours as needed for nausea. 30 tablet 2 Taking    Review of Systems  Constitutional: Positive for malaise/fatigue. Negative for fever and chills.  Gastrointestinal: Positive for abdominal pain (only mild cramps now). Negative for nausea and vomiting.  Genitourinary:   Light to moderate bleeding  Neurological: Positive for dizziness and weakness.   Physical Exam   Blood pressure 75/56, pulse 80, temperature 97.9 F (36.6 C), temperature source Oral, resp. rate 20, last menstrual period 08/25/2014, currently breastfeeding.  Physical Exam  Constitutional: She is oriented to person, place, and time. She appears well-developed and well-nourished. No distress.  Cardiovascular: Normal rate,  regular rhythm and normal heart sounds. Exam reveals no gallop and no friction rub.  No murmur heard. Respiratory: Effort normal.  GI: Soft. She exhibits no distension and no mass. There is no tenderness. There is no rebound and no guarding.  Genitourinary: Vaginal discharge (light to mod blood) found.  Musculoskeletal: Normal range of motion.  Neurological: She is alert and oriented to person, place, and time.  Skin: Skin is warm and dry.  Psychiatric: She has a normal mood and affect.   Assessment: Principal Problem:   Abortion, spontaneous with  hemorrhage Active Problems:   Orthostasis  Plan: Patient to be admitted to 3rd floor for observation. Serial CBC IVF Discharge once no longer orthostatic

## 2014-12-02 DIAGNOSIS — O036 Delayed or excessive hemorrhage following complete or unspecified spontaneous abortion: Secondary | ICD-10-CM | POA: Diagnosis not present

## 2014-12-02 DIAGNOSIS — D62 Acute posthemorrhagic anemia: Secondary | ICD-10-CM | POA: Diagnosis present

## 2014-12-02 LAB — CBC
HCT: 23.4 % — ABNORMAL LOW (ref 36.0–46.0)
HCT: 29.3 % — ABNORMAL LOW (ref 36.0–46.0)
Hemoglobin: 7.7 g/dL — ABNORMAL LOW (ref 12.0–15.0)
Hemoglobin: 10 g/dL — ABNORMAL LOW (ref 12.0–15.0)
MCH: 27.9 pg (ref 26.0–34.0)
MCH: 30 pg (ref 26.0–34.0)
MCHC: 32.9 g/dL (ref 30.0–36.0)
MCHC: 34.1 g/dL (ref 30.0–36.0)
MCV: 84.8 fL (ref 78.0–100.0)
MCV: 88 fL (ref 78.0–100.0)
Platelets: 140 10*3/uL — ABNORMAL LOW (ref 150–400)
Platelets: 167 10*3/uL (ref 150–400)
RBC: 2.76 MIL/uL — ABNORMAL LOW (ref 3.87–5.11)
RBC: 3.33 MIL/uL — ABNORMAL LOW (ref 3.87–5.11)
RDW: 13.6 % (ref 11.5–15.5)
RDW: 13.7 % (ref 11.5–15.5)
WBC: 5.4 10*3/uL (ref 4.0–10.5)
WBC: 8.5 10*3/uL (ref 4.0–10.5)

## 2014-12-02 LAB — PREPARE RBC (CROSSMATCH)

## 2014-12-02 MED ORDER — ACETAMINOPHEN 325 MG PO TABS
650.0000 mg | ORAL_TABLET | Freq: Once | ORAL | Status: AC
  Administered 2014-12-02: 650 mg via ORAL
  Filled 2014-12-02: qty 2

## 2014-12-02 MED ORDER — DIPHENHYDRAMINE HCL 25 MG PO CAPS
25.0000 mg | ORAL_CAPSULE | Freq: Once | ORAL | Status: AC
  Administered 2014-12-02: 25 mg via ORAL
  Filled 2014-12-02: qty 1

## 2014-12-02 MED ORDER — SODIUM CHLORIDE 0.9 % IV SOLN
Freq: Once | INTRAVENOUS | Status: AC
  Administered 2014-12-02: 14:00:00 via INTRAVENOUS

## 2014-12-02 NOTE — Discharge Summary (Signed)
Physician Discharge Summary  Patient ID: Vanessa Larson MRN: [id number] DOB/AGE: [birth year] 34 y.o.  Admit date: 12/01/2014 Discharge date: 12/02/2014   Discharge Diagnoses:  Principal Problem:   Abortion, spontaneous with hemorrhage Active Problems:   Orthostasis   Acute blood loss anemia   Consults: None  Significant Diagnostic Studies: labs:  CBC    Component Value Date/Time   WBC 7.4 12/01/2014 1427   RBC 2.92* 12/01/2014 1427   HGB 8.4* 12/01/2014 1427   HCT 24.6* 12/01/2014 1427   PLT 165 12/01/2014 1427   MCV 84.2 12/01/2014 1427   MCH 28.8 12/01/2014 1427   MCHC 34.1 12/01/2014 1427   RDW 13.7 12/01/2014 1427   LYMPHSABS 1.2 12/01/2014 0810   MONOABS 0.5 12/01/2014 0810   EOSABS 0.0 12/01/2014 0810   BASOSABS 0.0 12/01/2014 0810    [place] Ob Comp Less 14 Wks  12/01/2014   CLINICAL DATA:  Recent abortion, with heavy bleeding. Assess for retained products of conception. Initial encounter.  EXAM: OBSTETRIC <14 WK US AND TRANSVAGINAL OB US  TECHNIQUE: Both transabdominal and transvaginal ultrasound examinations were performed for complete evaluation of the gestation as well as the maternal uterus, adnexal regions, and pelvic cul-de-sac. Transvaginal technique was performed to assess early pregnancy.  COMPARISON:  None.  FINDINGS: Intrauterine gestational sac:  None seen.  Yolk sac:  N/A  Embryo:  N/A  Maternal uterus/adnexae: The endometrial echo complex is thickened, measuring 1.6 cm, likely reflecting some amount of blood and clot. No associated blood flow is seen on limited color Doppler evaluation to suggest retained products of conception.  The ovaries are unremarkable in appearance. The right ovary measures 2.7 x 1.9 x 1.7 cm, while the left ovary measures 4.1 x 2.8 x 2.2 cm. No suspicious adnexal masses are seen; there is no evidence for ovarian torsion.  Trace free fluid is seen within the pelvic cul-de-sac.  IMPRESSION: No evidence for retained products of conception.  Thickening of the endometrial echo complex likely reflect some degree of underlying blood and clot. Uterus otherwise grossly unremarkable.   Electronically Signed   By: Vanessa RaiderJeffery  Larson M.D.   On: 12/01/2014 01:48   [place] Ob Transvaginal  12/01/2014   CLINICAL DATA:  Recent abortion, with heavy bleeding. Assess for retained products of conception. Initial encounter.  EXAM: OBSTETRIC <14 WK US AND TRANSVAGINAL OB US  TECHNIQUE: Both transabdominal and transvaginal ultrasound examinations were performed for complete evaluation of the gestation as well as the maternal uterus, adnexal regions, and pelvic cul-de-sac. Transvaginal technique was performed to assess early pregnancy.  COMPARISON:  None.  FINDINGS: Intrauterine gestational sac:  None seen.  Yolk sac:  N/A  Embryo:  N/A  Maternal uterus/adnexae: The endometrial echo complex is thickened, measuring 1.6 cm, likely reflecting some amount of blood and clot. No associated blood flow is seen on limited color Doppler evaluation to suggest retained products of conception.  The ovaries are unremarkable in appearance. The right ovary measures 2.7 x 1.9 x 1.7 cm, while the left ovary measures 4.1 x 2.8 x 2.2 cm. No suspicious adnexal masses are seen; there is no evidence for ovarian torsion.  Trace free fluid is seen within the pelvic cul-de-sac.  IMPRESSION: No evidence for retained products of conception. Thickening of the endometrial echo complex likely reflect some degree of underlying blood and clot. Uterus otherwise grossly unremarkable.   Electronically Signed   By: Vanessa RaiderJeffery  Larson M.D.   On: 12/01/2014 01:48     Hospital Course: Admitted after  miscarriage and u/s showed no retained POC, for instability of VS.  Received 6 L IVF, and serial CBC's.  Eventually equilibrated and was able to stand and was no longer orthostatic.  She had average bleeding.  Discharge Exam: Physical Examination: General appearance - alert, well appearing, and in no distress Chest -  normal effort Heart - normal rate and regular rhythm Abdomen - soft, nontender, nondistended, no masses or organomegaly   Disposition: 01-Home or Self Care  Discharged Condition: improved  Discharge Instructions    Call MD for:  extreme fatigue    Complete by:  As directed      Call MD for:  persistant dizziness or light-headedness    Complete by:  As directed      Call MD for:  persistant nausea and vomiting    Complete by:  As directed      Call MD for:    Complete by:  As directed   Heavy vaginal bleeding, more than a pad/hour     Diet - low sodium heart healthy    Complete by:  As directed      Increase activity slowly    Complete by:  As directed             Medication List    STOP taking these medications        misoprostol 200 MCG tablet  Commonly known as:  CYTOTEC      TAKE these medications        buPROPion 300 MG 24 hr tablet  Commonly known as:  WELLBUTRIN XL  Take 1 tablet (300 mg total) by mouth daily.     Doxylamine-Pyridoxine 10-10 MG Tbec  Commonly known as:  DICLEGIS  Take 2 tabls at bedtime.  If needed, add another tab in the morning and another in the afternoon. You may take up to 4 tablets/day     ibuprofen 200 MG tablet  Commonly known as:  ADVIL,MOTRIN  Take 600 mg by mouth every 6 (six) hours as needed for moderate pain.     oxyCODONE-acetaminophen 5-325 MG per tablet  Commonly known as:  PERCOCET/ROXICET  Take 1 tablet by mouth every 4 (four) hours as needed for severe pain.     prenatal multivitamin Tabs tablet  Take 1 tablet by mouth daily at 12 noon.     promethazine 25 MG tablet  Commonly known as:  PHENERGAN  Take 0.5-1 tablets (12.5-25 mg total) by mouth every 6 (six) hours as needed for nausea.           Follow-up Information    Follow up with [facility] [place]. Schedule an appointment as soon as possible for a visit in 2 weeks.   Specialty:  Obstetrics and Gynecology   Contact information:    [address] [place] [address] [phone]      Follow up In 2 weeks.      Signed: Glendoris Larson S 12/02/2014, 7:55 AM

## 2014-12-02 NOTE — Progress Notes (Signed)
Pt was trying to sleep when I came by.  She briefly reported that she is doing okay.  She is aware of our ongoing availability for support.  Please page as needs arise.  Centex CorporationChaplain Katy Shaunte Weissinger Pager, [phone] 3:37 PM    12/02/14 1500  Clinical Encounter Type  Visited With Patient;Family;Patient not available  Visit Type Initial

## 2014-12-02 NOTE — Discharge Summary (Signed)
Physician Discharge Summary  Patient ID: Vanessa Larson MRN: [id number] DOB/AGE: 33-21-1982 33 y.o.  Admit date: 12/01/2014 Discharge date: 12/02/2014  Admission Diagnoses: SAB w/ hemorrhage  Discharge Diagnoses:  Principal Problem:   Abortion, spontaneous with hemorrhage Active Problems:   Orthostasis   Acute blood loss anemia   Discharged Condition: stable  Hospital Course:  34 y.o. female who is s/p missed abortion. She was believed to be 14 weeks but was found to have a missed abortion of 9 wks size. She had originally elected to have D&E but later decided to try Cytotec. She later passed the fetus then had heavy bleeding and began to feel dizzy.Patient presented to the MAU. She was given a total of 5L IVF. She continued to be orthostatic despite fluid replacement, eating, and drinking.She was not actively bleeding in the MAU. Baseline Hgb appeared to be 11.0-12.0. Hgb in the MAU found to be as low as 7.7. Patient was admitted and given 2U pRBCs.  Patient had great improvement w/ transfusion. She was no longer orthostatic. No longer c/o HA, dizziness, or nausea. Patient had a stable post-transfusion CBC, as well as orthostatic VS WNL and was deemed medically stable for DC.   Prior to DC I personally sat w/ patient and family (mother/husband/children) to discuss patient's status and our treatment course. Patient and family asked great questions and I answered these to the best of my abilities. Patient and family seemed very satisfied w/ our discussion at the time of my departure.   Consults: None  Significant Diagnostic Studies: labs: CBC  Treatments: IV hydration and pRBC  Discharge Exam: Blood pressure 99/53, pulse 86, temperature 98.8 F (37.1 C), temperature source Oral, resp. rate 20, height  (1.676 m), weight 72.938 kg (160 lb 12.8 oz), last menstrual period 08/25/2014, SpO2 100 %, unknown if currently breastfeeding. General appearance: alert, cooperative, appears  stated age and no distress Head: Normocephalic, without obvious abnormality, atraumatic Resp: clear to auscultation bilaterally Cardio: regular rate and rhythm, S1, S2 normal, no murmur, click, rub or gallop GI: soft, non-tender; bowel sounds normal; no masses,  no organomegaly Neurologic: Alert and oriented X 3, normal strength and tone. Normal symmetric reflexes. Normal coordination and gait  Disposition: 01-Home or Self Care      Discharge Instructions    Call MD for:  extreme fatigue    Complete by:  As directed      Call MD for:  persistant dizziness or light-headedness    Complete by:  As directed      Call MD for:  persistant nausea and vomiting    Complete by:  As directed      Call MD for:    Complete by:  As directed   Heavy vaginal bleeding, more than a pad/hour     Diet - low sodium heart healthy    Complete by:  As directed      Increase activity slowly    Complete by:  As directed             Medication List    STOP taking these medications        misoprostol 200 MCG tablet  Commonly known as:  CYTOTEC      TAKE these medications        buPROPion 300 MG 24 hr tablet  Commonly known as:  WELLBUTRIN XL  Take 1 tablet (300 mg total) by mouth daily.     Doxylamine-Pyridoxine 10-10 MG Tbec  Commonly known as:  DICLEGIS  Take 2 tabls at bedtime.  If needed, add another tab in the morning and another in the afternoon. You may take up to 4 tablets/day     ibuprofen 200 MG tablet  Commonly known as:  ADVIL,MOTRIN  Take 600 mg by mouth every 6 (six) hours as needed for moderate pain.     oxyCODONE-acetaminophen 5-325 MG per tablet  Commonly known as:  PERCOCET/ROXICET  Take 1 tablet by mouth every 4 (four) hours as needed for severe pain.     prenatal multivitamin Tabs tablet  Take 1 tablet by mouth daily at 12 noon.     promethazine 25 MG tablet  Commonly known as:  PHENERGAN  Take 0.5-1 tablets (12.5-25 mg total) by mouth every 6 (six) hours as needed  for nausea.       Follow-up Information    [facility] [place]. Schedule an appointment as soon as possible for a visit in 2 weeks.   Specialty:  Obstetrics and Gynecology   Contact information:   [address], [address] [place] [id number] [phone]      Signed: Kathee DeltonMcKeag, Ian D 12/02/2014, 9:23 PM   I have seen and examined this patient and I agree with the above. Pt ready for discharge. Feels much better after transfusion. Will ensure that orthostatics are okay and then will discharge. Cam HaiSHAW, Tyri Elmore CNM 10:58 PM 12/02/2014

## 2014-12-02 NOTE — Progress Notes (Signed)
Ur chart review completed.  

## 2014-12-02 NOTE — Discharge Instructions (Signed)
Miscarriage °A miscarriage is the sudden loss of an unborn baby (fetus) before the 20th week of pregnancy. Most miscarriages happen in the first 3 months of pregnancy. Sometimes, it happens before a woman even knows she is pregnant. A miscarriage is also called a "spontaneous miscarriage" or "early pregnancy loss." Having a miscarriage can be an emotional experience. Talk with your caregiver about any questions you may have about miscarrying, the grieving process, and your future pregnancy plans. °CAUSES  °· Problems with the fetal chromosomes that make it impossible for the baby to develop normally. Problems with the baby's genes or chromosomes are most often the result of errors that occur, by chance, as the embryo divides and grows. The problems are not inherited from the parents. °· Infection of the cervix or uterus.   °· Hormone problems.   °· Problems with the cervix, such as having an incompetent cervix. This is when the tissue in the cervix is not strong enough to hold the pregnancy.   °· Problems with the uterus, such as an abnormally shaped uterus, uterine fibroids, or congenital abnormalities.   °· Certain medical conditions.   °· Smoking, drinking alcohol, or taking illegal drugs.   °· Trauma.   °Often, the cause of a miscarriage is unknown.  °SYMPTOMS  °· Vaginal bleeding or spotting, with or without cramps or pain. °· Pain or cramping in the abdomen or lower back. °· Passing fluid, tissue, or blood clots from the vagina. °DIAGNOSIS  °Your caregiver will perform a physical exam. You may also have an ultrasound to confirm the miscarriage. Blood or urine tests may also be ordered. °TREATMENT  °· Sometimes, treatment is not necessary if you naturally pass all the fetal tissue that was in the uterus. If some of the fetus or placenta remains in the body (incomplete miscarriage), tissue left behind may become infected and must be removed. Usually, a dilation and curettage (D and C) procedure is performed.  During a D and C procedure, the cervix is widened (dilated) and any remaining fetal or placental tissue is gently removed from the uterus. °· Antibiotic medicines are prescribed if there is an infection. Other medicines may be given to reduce the size of the uterus (contract) if there is a lot of bleeding. °· If you have Rh negative blood and your baby was Rh positive, you will need a Rh immunoglobulin shot. This shot will protect any future baby from having Rh blood problems in future pregnancies. °HOME CARE INSTRUCTIONS  °· Your caregiver may order bed rest or may allow you to continue light activity. Resume activity as directed by your caregiver. °· Have someone help with home and family responsibilities during this time.   °· Keep track of the number of sanitary pads you use each day and how soaked (saturated) they are. Write down this information.   °· Do not use tampons. Do not douche or have sexual intercourse until approved by your caregiver.   °· Only take over-the-counter or prescription medicines for pain or discomfort as directed by your caregiver.   °· Do not take aspirin. Aspirin can cause bleeding.   °· Keep all follow-up appointments with your caregiver.   °· If you or your partner have problems with grieving, talk to your caregiver or seek counseling to help cope with the pregnancy loss. Allow enough time to grieve before trying to get pregnant again.   °SEEK IMMEDIATE MEDICAL CARE IF:  °· You have severe cramps or pain in your back or abdomen. °· You have a fever. °· You pass large blood clots (walnut-sized   or larger) ortissue from your vagina. Save any tissue for your caregiver to inspect.   Your bleeding increases.   You have a thick, bad-smelling vaginal discharge.  You become lightheaded, weak, or you faint.   You have chills.  MAKE SURE YOU:  Understand these instructions.  Will watch your condition.  Will get help right away if you are not doing well or get  worse. Document Released: 04/11/2001 Document Revised: 02/10/2013 Document Reviewed: 12/05/2011 [facility] Patient Information 2015 [place], [place]. This information is not intended to replace advice given to you by your health care provider. Make sure you discuss any questions you have with your health care provider. Anemia, Nonspecific Anemia is a condition in which the concentration of red blood cells or hemoglobin in the blood is below normal. Hemoglobin is a substance in red blood cells that carries oxygen to the tissues of the body. Anemia results in not enough oxygen reaching these tissues.  CAUSES  Common causes of anemia include:   Excessive bleeding. Bleeding may be internal or external. This includes excessive bleeding from periods (in women) or from the intestine.   Poor nutrition.   Chronic kidney, thyroid, and liver disease.  Bone marrow disorders that decrease red blood cell production.  Cancer and treatments for cancer.  HIV, AIDS, and their treatments.  Spleen problems that increase red blood cell destruction.  Blood disorders.  Excess destruction of red blood cells due to infection, medicines, and autoimmune disorders. SIGNS AND SYMPTOMS   Minor weakness.   Dizziness.   Headache.  Palpitations.   Shortness of breath, especially with exercise.   Paleness.  Cold sensitivity.  Indigestion.  Nausea.  Difficulty sleeping.  Difficulty concentrating. Symptoms may occur suddenly or they may develop slowly.  DIAGNOSIS  Additional blood tests are often needed. These help your health care provider determine the best treatment. Your health care provider will check your stool for blood and look for other causes of blood loss.  TREATMENT  Treatment varies depending on the cause of the anemia. Treatment can include:   Supplements of iron, vitamin B12, or folic acid.   Hormone medicines.   A blood transfusion. This may be needed if blood loss is  severe.   Hospitalization. This may be needed if there is significant continual blood loss.   Dietary changes.  Spleen removal. HOME CARE INSTRUCTIONS Keep all follow-up appointments. It often takes many weeks to correct anemia, and having your health care provider check on your condition and your response to treatment is very important. SEEK IMMEDIATE MEDICAL CARE IF:   You develop extreme weakness, shortness of breath, or chest pain.   You become dizzy or have trouble concentrating.  You develop heavy vaginal bleeding.   You develop a rash.   You have bloody or black, tarry stools.   You faint.   You vomit up blood.   You vomit repeatedly.   You have abdominal pain.  You have a fever or persistent symptoms for more than 2-3 days.   You have a fever and your symptoms suddenly get worse.   You are dehydrated.  MAKE SURE YOU:  Understand these instructions.  Will watch your condition.  Will get help right away if you are not doing well or get worse. Document Released: 11/23/2004 Document Revised: 06/18/2013 Document Reviewed: 04/11/2013 [facility] Patient Information 2015 [place], [place]. This information is not intended to replace advice given to you by your health care provider. Make sure you discuss any questions you have  with your health care provider. ° °

## 2014-12-03 LAB — TYPE AND SCREEN
ABO/RH(D): A POS
ANTIBODY SCREEN: NEGATIVE
Unit division: 0
Unit division: 0

## 2014-12-03 NOTE — Progress Notes (Signed)
Discharge instructions given to patient, understanding voiced. Patient and all belongings escorted to private auto by Luisa DagoJ. Bass, NT.

## 2014-12-06 ENCOUNTER — Observation Stay (HOSPITAL_COMMUNITY)
Admission: AD | Admit: 2014-12-06 | Discharge: 2014-12-07 | Disposition: A | Discharge status: Home / Self Care | Payer: BLUE CROSS/BLUE SHIELD | Source: Ambulatory Visit | Attending: Family Medicine | Admitting: Family Medicine

## 2014-12-06 ENCOUNTER — Inpatient Hospital Stay (HOSPITAL_COMMUNITY): Payer: BLUE CROSS/BLUE SHIELD

## 2014-12-06 ENCOUNTER — Observation Stay (HOSPITAL_COMMUNITY): Payer: BLUE CROSS/BLUE SHIELD | Admitting: Anesthesiology

## 2014-12-06 ENCOUNTER — Encounter (HOSPITAL_COMMUNITY)
Admission: AD | Disposition: A | Discharge status: Home / Self Care | Payer: Self-pay | Source: Ambulatory Visit | Attending: Obstetrics & Gynecology

## 2014-12-06 ENCOUNTER — Encounter (HOSPITAL_COMMUNITY): Payer: Self-pay

## 2014-12-06 DIAGNOSIS — F329 Major depressive disorder, single episode, unspecified: Secondary | ICD-10-CM | POA: Insufficient documentation

## 2014-12-06 DIAGNOSIS — D62 Acute posthemorrhagic anemia: Secondary | ICD-10-CM | POA: Diagnosis not present

## 2014-12-06 DIAGNOSIS — D500 Iron deficiency anemia secondary to blood loss (chronic): Secondary | ICD-10-CM | POA: Insufficient documentation

## 2014-12-06 DIAGNOSIS — R55 Syncope and collapse: Secondary | ICD-10-CM | POA: Diagnosis not present

## 2014-12-06 DIAGNOSIS — N939 Abnormal uterine and vaginal bleeding, unspecified: Secondary | ICD-10-CM

## 2014-12-06 DIAGNOSIS — O036 Delayed or excessive hemorrhage following complete or unspecified spontaneous abortion: Secondary | ICD-10-CM

## 2014-12-06 DIAGNOSIS — F419 Anxiety disorder, unspecified: Secondary | ICD-10-CM | POA: Diagnosis not present

## 2014-12-06 DIAGNOSIS — D649 Anemia, unspecified: Secondary | ICD-10-CM | POA: Diagnosis not present

## 2014-12-06 DIAGNOSIS — O034 Incomplete spontaneous abortion without complication: Principal | ICD-10-CM | POA: Insufficient documentation

## 2014-12-06 DIAGNOSIS — Z79899 Other long term (current) drug therapy: Secondary | ICD-10-CM | POA: Diagnosis not present

## 2014-12-06 HISTORY — PX: DILATION AND EVACUATION: SHX1459

## 2014-12-06 LAB — CBC
HCT: 26 % — ABNORMAL LOW (ref 36.0–46.0)
HEMATOCRIT: 27.5 % — AB (ref 36.0–46.0)
HEMOGLOBIN: 9.5 g/dL — AB (ref 12.0–15.0)
Hemoglobin: 8.7 g/dL — ABNORMAL LOW (ref 12.0–15.0)
MCH: 29.3 pg (ref 26.0–34.0)
MCH: 30.2 pg (ref 26.0–34.0)
MCHC: 33.5 g/dL (ref 30.0–36.0)
MCHC: 34.5 g/dL (ref 30.0–36.0)
MCV: 87.5 fL (ref 78.0–100.0)
MCV: 87.3 fL (ref 78.0–100.0)
PLATELETS: 150 10*3/uL (ref 150–400)
Platelets: 194 10*3/uL (ref 150–400)
RBC: 2.97 MIL/uL — ABNORMAL LOW (ref 3.87–5.11)
RBC: 3.15 MIL/uL — AB (ref 3.87–5.11)
RDW: 14.1 % (ref 11.5–15.5)
RDW: 14.3 % (ref 11.5–15.5)
WBC: 6.1 10*3/uL (ref 4.0–10.5)
WBC: 5.9 10*3/uL (ref 4.0–10.5)

## 2014-12-06 LAB — SURGICAL PCR SCREEN
MRSA, PCR: NEGATIVE
Staphylococcus aureus: NEGATIVE

## 2014-12-06 LAB — HCG, QUANTITATIVE, PREGNANCY: HCG, BETA CHAIN, QUANT, S: 1054 m[IU]/mL — AB (ref <5)

## 2014-12-06 LAB — PREPARE RBC (CROSSMATCH)

## 2014-12-06 SURGERY — DILATION AND EVACUATION, UTERUS
Anesthesia: Monitor Anesthesia Care

## 2014-12-06 MED ORDER — ACETAMINOPHEN 500 MG PO TABS: 1000.0000 mg | ORAL_TABLET | Freq: Four times a day (QID) | ORAL | Status: DC | PRN

## 2014-12-06 MED ORDER — MIDAZOLAM HCL 2 MG/2ML IJ SOLN
INTRAMUSCULAR | Status: DC | PRN
  Administered 2014-12-06 (×2): 1 mg via INTRAVENOUS

## 2014-12-06 MED ORDER — HYDROMORPHONE HCL 1 MG/ML IJ SOLN: 0.2000 mg | INTRAMUSCULAR | Status: DC | PRN

## 2014-12-06 MED ORDER — ACETAMINOPHEN 325 MG PO TABS: 650.0000 mg | ORAL_TABLET | Freq: Once | ORAL | Status: DC

## 2014-12-06 MED ORDER — ONDANSETRON HCL 4 MG PO TABS: 4.0000 mg | ORAL_TABLET | Freq: Four times a day (QID) | ORAL | Status: DC | PRN

## 2014-12-06 MED ORDER — DOCUSATE SODIUM 100 MG PO CAPS: 100.0000 mg | ORAL_CAPSULE | Freq: Two times a day (BID) | ORAL | Status: DC | PRN

## 2014-12-06 MED ORDER — DEXTROSE 5 % IV SOLN
100.0000 mg | INTRAVENOUS | Status: AC
  Administered 2014-12-06: 100 mg via INTRAVENOUS
  Filled 2014-12-06: qty 100

## 2014-12-06 MED ORDER — FENTANYL CITRATE 0.05 MG/ML IJ SOLN
INTRAMUSCULAR | Status: AC
  Filled 2014-12-06: qty 2

## 2014-12-06 MED ORDER — OXYCODONE-ACETAMINOPHEN 5-325 MG PO TABS: 1.0000 | ORAL_TABLET | ORAL | Status: DC | PRN

## 2014-12-06 MED ORDER — BUPIVACAINE-EPINEPHRINE (PF) 0.25% -1:200000 IJ SOLN
INTRAMUSCULAR | Status: AC
  Filled 2014-12-06: qty 30

## 2014-12-06 MED ORDER — SILVER NITRATE-POT NITRATE 75-25 % EX MISC
CUTANEOUS | Status: DC | PRN
  Administered 2014-12-06: 2

## 2014-12-06 MED ORDER — ONDANSETRON HCL 4 MG/2ML IJ SOLN
INTRAMUSCULAR | Status: DC | PRN
  Administered 2014-12-06: 4 mg via INTRAVENOUS

## 2014-12-06 MED ORDER — BUPROPION HCL ER (XL) 300 MG PO TB24
300.0000 mg | ORAL_TABLET | Freq: Every day | ORAL | Status: DC
  Administered 2014-12-07: 300 mg via ORAL
  Filled 2014-12-06 (×3): qty 1

## 2014-12-06 MED ORDER — DIPHENHYDRAMINE HCL 25 MG PO CAPS: 25.0000 mg | ORAL_CAPSULE | Freq: Once | ORAL | Status: DC

## 2014-12-06 MED ORDER — MIDAZOLAM HCL 2 MG/2ML IJ SOLN
INTRAMUSCULAR | Status: AC
  Filled 2014-12-06: qty 2

## 2014-12-06 MED ORDER — FENTANYL CITRATE 0.05 MG/ML IJ SOLN: 25.0000 ug | INTRAMUSCULAR | Status: DC | PRN

## 2014-12-06 MED ORDER — PROPOFOL 10 MG/ML IV EMUL
INTRAVENOUS | Status: DC | PRN
Start: 2014-12-06 — End: 2014-12-06
  Administered 2014-12-06: 20 mg via INTRAVENOUS
  Administered 2014-12-06 (×2): 40 mg via INTRAVENOUS
  Administered 2014-12-06: 20 mg via INTRAVENOUS
  Administered 2014-12-06: 60 mg via INTRAVENOUS
  Administered 2014-12-06: 40 mg via INTRAVENOUS
  Administered 2014-12-06: 20 mg via INTRAVENOUS

## 2014-12-06 MED ORDER — OXYCODONE HCL 5 MG PO TABS: 5.0000 mg | ORAL_TABLET | Freq: Once | ORAL | Status: DC | PRN

## 2014-12-06 MED ORDER — LACTATED RINGERS IV SOLN
INTRAVENOUS | Status: DC | PRN
  Administered 2014-12-06 (×2): via INTRAVENOUS

## 2014-12-06 MED ORDER — OXYCODONE HCL 5 MG/5ML PO SOLN: 5.0000 mg | Freq: Once | ORAL | Status: DC | PRN

## 2014-12-06 MED ORDER — SODIUM CHLORIDE 0.9 % IV SOLN
Freq: Once | INTRAVENOUS | Status: AC
  Administered 2014-12-06 (×2): via INTRAVENOUS

## 2014-12-06 MED ORDER — LACTATED RINGERS IV SOLN
INTRAVENOUS | Status: DC
  Administered 2014-12-06: 05:00:00 via INTRAVENOUS

## 2014-12-06 MED ORDER — ONDANSETRON HCL 4 MG/2ML IJ SOLN: 4.0000 mg | Freq: Four times a day (QID) | INTRAMUSCULAR | Status: DC | PRN

## 2014-12-06 MED ORDER — FENTANYL CITRATE 0.05 MG/ML IJ SOLN
INTRAMUSCULAR | Status: DC | PRN
  Administered 2014-12-06 (×2): 50 ug via INTRAVENOUS

## 2014-12-06 MED ORDER — BUPIVACAINE-EPINEPHRINE 0.25% -1:200000 IJ SOLN
INTRAMUSCULAR | Status: DC | PRN
  Administered 2014-12-06: 20 mL

## 2014-12-06 SURGICAL SUPPLY — 18 items
CATH ROBINSON RED A/P 16FR (CATHETERS) ×3 IMPLANT
CLOTH BEACON ORANGE TIMEOUT ST (SAFETY) ×3 IMPLANT
DECANTER SPIKE VIAL GLASS SM (MISCELLANEOUS) ×3 IMPLANT
GLOVE BIOGEL PI IND STRL 7.0 (GLOVE) ×3 IMPLANT
GLOVE BIOGEL PI INDICATOR 7.0 (GLOVE) ×6
GLOVE ECLIPSE 7.0 STRL STRAW (GLOVE) ×6 IMPLANT
GOWN STRL REUS W/TWL LRG LVL3 (GOWN DISPOSABLE) ×9 IMPLANT
KIT BERKELEY 1ST TRIMESTER 3/8 (MISCELLANEOUS) ×3 IMPLANT
NS IRRIG 1000ML POUR BTL (IV SOLUTION) ×3 IMPLANT
PACK VAGINAL MINOR WOMEN LF (CUSTOM PROCEDURE TRAY) ×3 IMPLANT
PAD OB MATERNITY 4.3X12.25 (PERSONAL CARE ITEMS) ×3 IMPLANT
PAD PREP 24X48 CUFFED NSTRL (MISCELLANEOUS) ×3 IMPLANT
SET BERKELEY SUCTION TUBING (SUCTIONS) ×3 IMPLANT
TOWEL OR 17X24 6PK STRL BLUE (TOWEL DISPOSABLE) ×6 IMPLANT
VACURETTE 10 RIGID CVD (CANNULA) ×3 IMPLANT
VACURETTE 7MM CVD STRL WRAP (CANNULA) IMPLANT
VACURETTE 8 RIGID CVD (CANNULA) IMPLANT
VACURETTE 9 RIGID CVD (CANNULA) IMPLANT

## 2014-12-06 NOTE — H&P (Signed)
History     CSN: [id number]  Arrival date and time: 12/06/14 [id number]  First Provider Initiated Contact with Patient 12/06/14 [phone]    Chief Complaint  Patient presents with  . Vaginal Bleeding   HPI Comments: Vanessa Larson 34 y.o. [id number] presents to [facility]U with heavy vaginal bleeding and syncopal episode at home. She had a missed SAB one week ago and was scheduled for D&C. She cancelled the surgery and took Cytotec instead. Her bleeding became so heavy after cytotec she had to be transfused 2 units of packed RBC. She also received 6 liters of fluids. She was discharged from hospital on 2/3 and was doing well until tonight.   Vaginal Bleeding Associated symptoms include abdominal pain and headaches.      Past Medical History  Diagnosis Date  . Anxiety   . Depression   . Pregnancy related condition in third trimester 2010    [redacted] weeks gestation complicated by depression/anxiety  . H/O abnormal Pap smear     Past Surgical History  Procedure Laterality Date  . Cesarean section  2010    x 1  . Wisdom tooth extraction      Family History  Problem Relation Age of Onset  . Depression Mother   . Stroke Maternal Grandmother   . Cancer Maternal Grandfather     History  Substance Use Topics  . Smoking status: Never Smoker   . Smokeless tobacco: Never Used  . Alcohol Use: No    Allergies: No Known Allergies  Prescriptions prior to admission  Medication Sig Dispense Refill Last Dose  . acetaminophen (TYLENOL) 325 MG tablet Take 650 mg by mouth every 6 (six) hours as needed.   12/05/2014 at Unknown time  . buPROPion (WELLBUTRIN XL) 300 MG 24 hr tablet Take 1 tablet (300 mg total) by mouth daily. 90 tablet 1 Past Month at Unknown time  . Doxylamine-Pyridoxine (DICLEGIS) 10-10 MG TBEC Take 2 tabls at bedtime. If needed, add another tab in the morning and another in the afternoon. You  may take up to 4 tablets/day 100 tablet 5 Past Month at Unknown time  . ibuprofen (ADVIL,MOTRIN) 200 MG tablet Take 600 mg by mouth every 6 (six) hours as needed for moderate pain.   Past Week at Unknown time  . Prenatal Vit-Fe Fumarate-FA (PRENATAL MULTIVITAMIN) TABS Take 1 tablet by mouth daily at 12 noon.   Past Month at Unknown time  . oxyCODONE-acetaminophen (PERCOCET/ROXICET) 5-325 MG per tablet Take 1 tablet by mouth every 4 (four) hours as needed for severe pain. 20 tablet 0 prn  . promethazine (PHENERGAN) 25 MG tablet Take 0.5-1 tablets (12.5-25 mg total) by mouth every 6 (six) hours as needed for nausea. 30 tablet 2 prn    Review of Systems  Constitutional:   Fainting  Cardiovascular: Negative.  Gastrointestinal: Positive for abdominal pain.  Genitourinary: Negative.  Musculoskeletal: Negative.  Neurological: Positive for dizziness and headaches.  Psychiatric/Behavioral:   Has OCD, anxiety and depression   Physical Exam   Blood pressure 105/71, pulse 106, temperature 99 F (37.2 C), temperature source Oral, resp. rate 16, unknown if currently breastfeeding.  Physical Exam  Constitutional: She is oriented to person, place, and time. She appears well-developed and well-nourished. She appears distressed.  Appears pale and uncomfortable  HENT:  Head: Normocephalic and atraumatic.  Eyes: Pupils are equal, round, and reactive to light.  Cardiovascular:  tachycardia  Genitourinary:  Genital:external negative Vaginal:large amount of blood and what looks like POC Cervix: open  Musculoskeletal: Normal range of motion.  Neurological: She is alert and oriented to person, place, and time.  Skin: Skin is warm and dry.  Psychiatric: She has a normal mood and affect. Her behavior is normal. Judgment and thought content normal.    Lab Results Last 24 Hours    Results for orders placed or performed during the hospital encounter of  12/06/14 (from the past 24 hour(s))  CBC Status: Abnormal   Collection Time: 12/06/14 4:50 AM  Result Value Ref Range   WBC 6.1 4.0 - 10.5 K/uL   RBC 2.97 (L) 3.87 - 5.11 MIL/uL   Hemoglobin 8.7 (L) 12.0 - 15.0 g/dL   HCT 16.1 (L) 09.6 - 04.5 %   MCV 87.5 78.0 - 100.0 fL   MCH 29.3 26.0 - 34.0 pg   MCHC 33.5 30.0 - 36.0 g/dL   RDW 40.9 81.1 - 91.4 %   Platelets 194 150 - 400 K/uL  hCG, quantitative, pregnancy Status: Abnormal   Collection Time: 12/06/14 4:50 AM  Result Value Ref Range   hCG, Beta Chain, Quant, S 1054 (H) <5 mIU/mL  Type and screen Status: None   Collection Time: 12/06/14 4:50 AM  Result Value Ref Range   ABO/RH(D) A POS    Antibody Screen NEG    Sample Expiration 12/09/2014      Ultrasound:  MAU Course  Procedures  MDM   Assessment and Plan  A: Vaginal bleeding after completed miscarriage Syncope with anemia form blood loss  Carolynn Serve 12/06/2014, 5:48 AM    Attestation of Attending Supervision of Advanced Practitioner (PA/CNM/NP): Evaluation and management procedures were performed by the Advanced Practitioner under my supervision and collaboration. I have reviewed the Advanced Practitioner's note and chart, and I agree with the management and plan.   Imaging Results    US Ob Comp Less 14 Wks  12/01/2014 CLINICAL DATA: Recent abortion, with heavy bleeding. Assess for retained products of conception. Initial encounter. EXAM: OBSTETRIC <14 WK [place] AND TRANSVAGINAL OB US TECHNIQUE: Both transabdominal and transvaginal ultrasound examinations were performed for complete evaluation of the gestation as well as the maternal uterus, adnexal regions, and pelvic cul-de-sac. Transvaginal technique was performed to assess early pregnancy. COMPARISON: None. FINDINGS: Intrauterine gestational sac: None seen. Yolk sac: N/A Embryo: N/A Maternal uterus/adnexae:  The endometrial echo complex is thickened, measuring 1.6 cm, likely reflecting some amount of blood and clot. No associated blood flow is seen on limited color Doppler evaluation to suggest retained products of conception. The ovaries are unremarkable in appearance. The right ovary measures 2.7 x 1.9 x 1.7 cm, while the left ovary measures 4.1 x 2.8 x 2.2 cm. No suspicious adnexal masses are seen; there is no evidence for ovarian torsion. Trace free fluid is seen within the pelvic cul-de-sac. IMPRESSION: No evidence for retained products of conception. Thickening of the endometrial echo complex likely reflect some degree of underlying blood and clot. Uterus otherwise grossly unremarkable. Electronically Signed By: Roanna Raider M.D. On: 12/01/2014 01:48   US Ob Transvaginal  12/01/2014 CLINICAL DATA: Recent abortion, with heavy bleeding. Assess for retained products of conception. Initial encounter. EXAM: OBSTETRIC <14 WK [place] AND TRANSVAGINAL OB US TECHNIQUE: Both transabdominal and transvaginal ultrasound examinations were performed for complete evaluation of the gestation as well as the maternal uterus, adnexal regions, and pelvic cul-de-sac. Transvaginal technique was performed to assess early pregnancy. COMPARISON: None. FINDINGS: Intrauterine gestational sac: None seen. Yolk sac: N/A Embryo: N/A Maternal uterus/adnexae: The endometrial echo complex is  thickened, measuring 1.6 cm, likely reflecting some amount of blood and clot. No associated blood flow is seen on limited color Doppler evaluation to suggest retained products of conception. The ovaries are unremarkable in appearance. The right ovary measures 2.7 x 1.9 x 1.7 cm, while the left ovary measures 4.1 x 2.8 x 2.2 cm. No suspicious adnexal masses are seen; there is no evidence for ovarian torsion. Trace free fluid is seen within the pelvic cul-de-sac. IMPRESSION: No evidence for retained products of conception. Thickening of  the endometrial echo complex likely reflect some degree of underlying blood and clot. Uterus otherwise grossly unremarkable. Electronically Signed By: Roanna Raider M.D. On: 12/01/2014 01:48   US Transvaginal Non-ob  12/06/2014 CLINICAL DATA: Increased heavy vaginal bleeding with clots status post missed abortion. EXAM: ULTRASOUND PELVIS TRANSVAGINAL TECHNIQUE: Transvaginal ultrasound examination of the pelvis was performed including evaluation of the uterus, ovaries, adnexal regions, and pelvic cul-de-sac. COMPARISON: Ultrasound 12/01/2014 FINDINGS: Uterus Measurements: 9.6 x 4.7 x 5.8 cm. No fibroids or other mass visualized. Endometrium Thickness: Remains thickened measuring 2.2 cm with heterogeneous echotexture. No definite associated fluid. There is no hyperemia to suggest retained products of conception. Right ovary Measurements: 3.0 x 1.7 x 1.6 cm. Normal appearance/no adnexal mass. Left ovary Not well seen currently. Other findings: No free fluid IMPRESSION: Thickened heterogeneous endometrium. There is no abnormal vascularity to suggest retained products of conception. Electronically Signed By: Rubye Oaks M.D. On: 12/06/2014 06:11     These results were discussed with patient and her husband. On exam, minimal-moderate bleeding noted which is significantly decreased from presentation earlier today. Patient is very anxious about possibility of retained products despite ultrasound read; given that she did pass some tissue after previous scan noted there were no RPOC. Patient had syncopal episode at home, feels very symptomatic due to her anemia and desires transfusion. Patient was offered expectant management vs methergine administration vs D&E. Risks of all modalities discussed in detail, patient is unsure of what she wants to do for now. Will observe on [facility] during her transfusion and keep NPO for now until patient decides on what she wants to do.  Routine transfusion and floor care.    Jaynie Collins, MD, FACOG Attending Obstetrician & Gynecologist Faculty Practice, [facility]  After continued counseling, pt. Decided to proceed with D&E. Will hang blood i the OR. Risks include but are not limited to bleeding, infection, injury to surrounding structures, including bowel, bladder and ureters, blood clots, and death.  Likelihood of success is high.  Angeliah Wisdom S 12/06/2014 8:57 AM

## 2014-12-06 NOTE — Transfer of Care (Signed)
Immediate Anesthesia Transfer of Care Note  Patient: Vanessa HartshornMolly M Larson  Procedure(s) Performed: Procedure(s): DILATATION AND EVACUATION (N/A)  Patient Location: PACU  Anesthesia Type:MAC  Level of Consciousness: awake, alert  and oriented  Airway & Oxygen Therapy: Patient Spontanous Breathing and Patient connected to nasal cannula oxygen  Post-op Assessment: Report given to RN and Post -op Vital signs reviewed and stable  Post vital signs: Reviewed and stable  Last Vitals:  Filed Vitals:   12/06/14 0932  BP: 108/54  Pulse: 89  Temp: 37.3 C  Resp: 18    Complications: No apparent anesthesia complications

## 2014-12-06 NOTE — MAU Note (Signed)
Woke up and felt like there was blood coming out with clots.  When she woke up and had a bm and then her ears started ringing and then passed out at 3 am. States she knew she was going to pass out so she started to lay down on the bathroom floor.  Husband ran in and she came to after a few seconds of passing out.  Had a miscarriage on Monday. Got a blood transfusion on Wednesday and later that day, was discharged.

## 2014-12-06 NOTE — Op Note (Signed)
Vanessa HartshornMolly M Larson  PROCEDURE DATE: 12/06/2014  PREOPERATIVE DIAGNOSIS: 9 week incomplete abortion.  POSTOPERATIVE DIAGNOSIS: The same.  PROCEDURE:    Suction Dilation and Evacuation.  SURGEON:  Arthur Aydelotte S  ANESTHESIA: Corky Soxhris Moser, MD  INDICATIONS: 34 y.o. [phone] with missed AB at 9 wks, underwent Cytotec with passage of fetus and subsequently hospitalized with orthostasis and got 2 u PRBC's. Bleeding persisted and patient re-presented and requested blood and D and C.  U/s did not show retained POC, but given persistent symptoms, pt. Was brought to OR.  Risks of surgery were discussed with the patient including but not limited to: bleeding which may require transfusion; infection which may require antibiotics; injury to uterus or surrounding organs;need for additional procedures including laparotomy or laparoscopy; possibility of intrauterine scarring which may impair future fertility; and other postoperative/anesthesia complications. Written informed consent was obtained.    FINDINGS:  A 10 wk size midline uterus, moderate amounts of products of conception, specimen sent to pathology.  ANESTHESIA:    Monitored intravenous sedation, paracervical block.  ESTIMATED BLOOD LOSS:  Less than 20 ml.  SPECIMENS:  Products of conception sent to pathology  COMPLICATIONS:  None immediate.  PROCEDURE DETAILS:  The patient received intravenous antibiotics while in the preoperative area.  She was then taken to the operating room where general anesthesia was administered and was found to be adequate.  After an adequate timeout was performed, she was placed in the dorsal lithotomy position and examined; then prepped and draped in the sterile manner.   Her bladder was catheterized for an unmeasured amount of clear, yellow urine. A vaginal speculum was then placed in the patient's vagina and a single tooth tenaculum was applied to the anterior lip of the cervix. There was some old clot noted in vagina and in  cervix.  A paracervical block using 0.25% Marcaine with Epinephrine was administered. The cervix was already dilated to accommodate a 10 mm suction curette that was gently advanced to the uterine fundus.  The suction device was then activated and curette slowly rotated to clear the uterus of products of conception. A small amount of tissue was removed. A sharp curettage was then performed to confirm complete emptying of the uterus.There was minimal bleeding noted and the tenaculum removed with bleeding from this site noted.  Pressure and AgNO3 sticks were used to achieve hemostasis.  The uterus was massaged and noted to be firm, with minimal bleeding. All insturmnent, needle and lap counts were correct x 2.The patient tolerated the procedure well.  The patient was taken to the recovery area in stable condition.  Tramon Crescenzo S 12/06/2014 10:24 AM

## 2014-12-06 NOTE — Anesthesia Postprocedure Evaluation (Signed)
  Anesthesia Post-op Note  Patient: Catarina HartshornMolly M Newbold  Procedure(s) Performed: Procedure(s): DILATATION AND EVACUATION (N/A)  Patient Location: PACU  Anesthesia Type:MAC  Level of Consciousness: awake  Airway and Oxygen Therapy: Patient Spontanous Breathing and Patient connected to nasal cannula oxygen  Post-op Pain: none  Post-op Assessment: Post-op Vital signs reviewed, Patient's Cardiovascular Status Stable, Respiratory Function Stable, Patent Airway, No signs of Nausea or vomiting and Pain level controlled  Post-op Vital Signs: Reviewed and stable  Last Vitals:  Filed Vitals:   12/06/14 1100  BP: 102/56  Pulse: 78  Temp:   Resp: 15    Complications: No apparent anesthesia complications

## 2014-12-06 NOTE — Anesthesia Postprocedure Evaluation (Signed)
  Anesthesia Post-op Note  Patient: Vanessa Larson  Procedure(s) Performed: Procedure(s): DILATATION AND EVACUATION (N/A)  Patient Location: [facility] Unit  Anesthesia Type:MAC  Level of Consciousness: awake  Airway and Oxygen Therapy: Patient Spontanous Breathing  Post-op Pain: mild  Post-op Assessment: Patient's Cardiovascular Status Stable and Respiratory Function Stable  Post-op Vital Signs: stable  Last Vitals:  Filed Vitals:   12/06/14 1413  BP: 107/54  Pulse: 78  Temp: 36.7 C  Resp: 18    Complications: No apparent anesthesia complications

## 2014-12-06 NOTE — MAU Provider Note (Signed)
History     CSN: [id number]  Arrival date and time: 12/06/14 4098   First Provider Initiated Contact with Patient 12/06/14 [phone]      Chief Complaint  Patient presents with  . Vaginal Bleeding   HPI Comments: Vanessa Larson 34 y.o. [id number] presents to MAU with heavy vaginal bleeding and syncopal episode at home. She had a missed SAB one week ago and was scheduled for D&C. She cancelled the surgery and took Cytotec instead. Her bleeding became so heavy after cytotec she had to be transfused 2 units of packed RBC. She also received 6 liters of fluids. She was discharged from hospital on 2/3 and was doing well until tonight.   Vaginal Bleeding Associated symptoms include abdominal pain and headaches.      Past Medical History  Diagnosis Date  . Anxiety   . Depression   . Pregnancy related condition in third trimester 2010    [redacted] weeks gestation complicated by depression/anxiety  . H/O abnormal Pap smear     Past Surgical History  Procedure Laterality Date  . Cesarean section  2010    x 1  . Wisdom tooth extraction      Family History  Problem Relation Age of Onset  . Depression Mother   . Stroke Maternal Grandmother   . Cancer Maternal Grandfather     History  Substance Use Topics  . Smoking status: Never Smoker   . Smokeless tobacco: Never Used  . Alcohol Use: No    Allergies: No Known Allergies  Prescriptions prior to admission  Medication Sig Dispense Refill Last Dose  . acetaminophen (TYLENOL) 325 MG tablet Take 650 mg by mouth every 6 (six) hours as needed.   12/05/2014 at Unknown time  . buPROPion (WELLBUTRIN XL) 300 MG 24 hr tablet Take 1 tablet (300 mg total) by mouth daily. 90 tablet 1 Past Month at Unknown time  . Doxylamine-Pyridoxine (DICLEGIS) 10-10 MG TBEC Take 2 tabls at bedtime.  If needed, add another tab in the morning and another in the afternoon. You may take up to 4 tablets/day 100 tablet 5 Past Month at Unknown time  . ibuprofen (ADVIL,MOTRIN)  200 MG tablet Take 600 mg by mouth every 6 (six) hours as needed for moderate pain.   Past Week at Unknown time  . Prenatal Vit-Fe Fumarate-FA (PRENATAL MULTIVITAMIN) TABS Take 1 tablet by mouth daily at 12 noon.   Past Month at Unknown time  . oxyCODONE-acetaminophen (PERCOCET/ROXICET) 5-325 MG per tablet Take 1 tablet by mouth every 4 (four) hours as needed for severe pain. 20 tablet 0 prn  . promethazine (PHENERGAN) 25 MG tablet Take 0.5-1 tablets (12.5-25 mg total) by mouth every 6 (six) hours as needed for nausea. 30 tablet 2 prn    Review of Systems  Constitutional:       Fainting  Cardiovascular: Negative.   Gastrointestinal: Positive for abdominal pain.  Genitourinary: Negative.   Musculoskeletal: Negative.   Neurological: Positive for dizziness and headaches.  Psychiatric/Behavioral:       Has OCD, anxiety and depression   Physical Exam   Blood pressure 105/71, pulse 106, temperature 99 F (37.2 C), temperature source Oral, resp. rate 16, unknown if currently breastfeeding.  Physical Exam  Constitutional: She is oriented to person, place, and time. She appears well-developed and well-nourished. She appears distressed.  Appears pale and uncomfortable  HENT:  Head: Normocephalic and atraumatic.  Eyes: Pupils are equal, round, and reactive to light.  Cardiovascular:  tachycardia  Genitourinary:  Genital:external negative Vaginal:large amount of blood and what looks like POC Cervix: open    Musculoskeletal: Normal range of motion.  Neurological: She is alert and oriented to person, place, and time.  Skin: Skin is warm and dry.  Psychiatric: She has a normal mood and affect. Her behavior is normal. Judgment and thought content normal.   Results for orders placed or performed during the hospital encounter of 12/06/14 (from the past 24 hour(s))  CBC     Status: Abnormal   Collection Time: 12/06/14  4:50 AM  Result Value Ref Range   WBC 6.1 4.0 - 10.5 K/uL   RBC 2.97  (L) 3.87 - 5.11 MIL/uL   Hemoglobin 8.7 (L) 12.0 - 15.0 g/dL   HCT 40.9 (L) 81.1 - 91.4 %   MCV 87.5 78.0 - 100.0 fL   MCH 29.3 26.0 - 34.0 pg   MCHC 33.5 30.0 - 36.0 g/dL   RDW 78.2 95.6 - 21.3 %   Platelets 194 150 - 400 K/uL  hCG, quantitative, pregnancy     Status: Abnormal   Collection Time: 12/06/14  4:50 AM  Result Value Ref Range   hCG, Beta Chain, Quant, S 1054 (H) <5 mIU/mL  Type and screen     Status: None   Collection Time: 12/06/14  4:50 AM  Result Value Ref Range   ABO/RH(D) A POS    Antibody Screen NEG    Sample Expiration 12/09/2014    Ultrasound:  MAU Course  Procedures  MDM   Assessment and Plan  A: Vaginal bleeding after completed miscarriage Syncope with anemia form blood loss  Carolynn Serve 12/06/2014, 5:48 AM    Attestation of Attending Supervision of Advanced Practitioner (PA/CNM/NP): Evaluation and management procedures were performed by the Advanced Practitioner under my supervision and collaboration.  I have reviewed the Advanced Practitioner's note and chart, and I agree with the management and plan.  US Ob Comp Less 14 Wks  12/01/2014   CLINICAL DATA:  Recent abortion, with heavy bleeding. Assess for retained products of conception. Initial encounter.  EXAM: OBSTETRIC <14 WK [place] AND TRANSVAGINAL OB US  TECHNIQUE: Both transabdominal and transvaginal ultrasound examinations were performed for complete evaluation of the gestation as well as the maternal uterus, adnexal regions, and pelvic cul-de-sac. Transvaginal technique was performed to assess early pregnancy.  COMPARISON:  None.  FINDINGS: Intrauterine gestational sac:  None seen.  Yolk sac:  N/A  Embryo:  N/A  Maternal uterus/adnexae: The endometrial echo complex is thickened, measuring 1.6 cm, likely reflecting some amount of blood and clot. No associated blood flow is seen on limited color Doppler evaluation to suggest retained products of conception.  The ovaries are unremarkable in  appearance. The right ovary measures 2.7 x 1.9 x 1.7 cm, while the left ovary measures 4.1 x 2.8 x 2.2 cm. No suspicious adnexal masses are seen; there is no evidence for ovarian torsion.  Trace free fluid is seen within the pelvic cul-de-sac.  IMPRESSION: No evidence for retained products of conception. Thickening of the endometrial echo complex likely reflect some degree of underlying blood and clot. Uterus otherwise grossly unremarkable.   Electronically Signed   By: Roanna Raider M.D.   On: 12/01/2014 01:48   US Ob Transvaginal  12/01/2014   CLINICAL DATA:  Recent abortion, with heavy bleeding. Assess for retained products of conception. Initial encounter.  EXAM: OBSTETRIC <14 WK [place] AND TRANSVAGINAL OB US  TECHNIQUE: Both transabdominal and transvaginal ultrasound examinations were  performed for complete evaluation of the gestation as well as the maternal uterus, adnexal regions, and pelvic cul-de-sac. Transvaginal technique was performed to assess early pregnancy.  COMPARISON:  None.  FINDINGS: Intrauterine gestational sac:  None seen.  Yolk sac:  N/A  Embryo:  N/A  Maternal uterus/adnexae: The endometrial echo complex is thickened, measuring 1.6 cm, likely reflecting some amount of blood and clot. No associated blood flow is seen on limited color Doppler evaluation to suggest retained products of conception.  The ovaries are unremarkable in appearance. The right ovary measures 2.7 x 1.9 x 1.7 cm, while the left ovary measures 4.1 x 2.8 x 2.2 cm. No suspicious adnexal masses are seen; there is no evidence for ovarian torsion.  Trace free fluid is seen within the pelvic cul-de-sac.  IMPRESSION: No evidence for retained products of conception. Thickening of the endometrial echo complex likely reflect some degree of underlying blood and clot. Uterus otherwise grossly unremarkable.   Electronically Signed   By: Roanna RaiderJeffery  Chang M.D.   On: 12/01/2014 01:48   [place] Transvaginal Non-ob  12/06/2014   CLINICAL DATA:   Increased heavy vaginal bleeding with clots status post missed abortion.  EXAM: ULTRASOUND PELVIS TRANSVAGINAL  TECHNIQUE: Transvaginal ultrasound examination of the pelvis was performed including evaluation of the uterus, ovaries, adnexal regions, and pelvic cul-de-sac.  COMPARISON:  Ultrasound 12/01/2014  FINDINGS: Uterus  Measurements: 9.6 x 4.7 x 5.8 cm. No fibroids or other mass visualized.  Endometrium  Thickness: Remains thickened measuring 2.2 cm with heterogeneous echotexture. No definite associated fluid. There is no hyperemia to suggest retained products of conception.  Right ovary  Measurements: 3.0 x 1.7 x 1.6 cm. Normal appearance/no adnexal mass.  Left ovary  Not well seen currently.  Other findings:  No free fluid  IMPRESSION: Thickened heterogeneous endometrium. There is no abnormal vascularity to suggest retained products of conception.   Electronically Signed   By: Rubye OaksMelanie  Ehinger M.D.   On: 12/06/2014 06:11    These results were discussed with patient and her husband.  On exam, minimal-moderate bleeding noted which is significantly decreased from presentation earlier today.  Patient is very anxious about possibility of retained products despite ultrasound read; given that she did pass some tissue after previous scan noted there were no RPOC.  Patient had syncopal episode at home, feels very symptomatic due to her anemia and desires transfusion.  Patient was offered expectant management vs methergine administration vs D&E.  Risks of all modalities discussed in detail, patient is unsure of what she wants to do for now.  Will observe on [facility] Unit during her transfusion and keep NPO for now until patient decides on what she wants to do.  Routine transfusion and floor care.    Jaynie CollinsUGONNA  Satya Buttram, MD, FACOG Attending Obstetrician & Gynecologist Faculty Practice, [facility] 12/06/2014 7:00 AM

## 2014-12-06 NOTE — MAU Note (Signed)
Per Selena BattenKim, pt to go to room 304

## 2014-12-06 NOTE — Anesthesia Preprocedure Evaluation (Signed)
Anesthesia Evaluation  Patient identified by MRN, date of birth, ID band Patient awake    Reviewed: Allergy & Precautions, NPO status , Patient's Chart, lab work & pertinent test results  History of Anesthesia Complications Negative for: history of anesthetic complications  Airway Mallampati: II  TM Distance: >3 FB Neck ROM: Full    Dental  (+) Teeth Intact,    Pulmonary neg pulmonary ROS,  breath sounds clear to auscultation        Cardiovascular negative cardio ROS  Rhythm:Regular     Neuro/Psych PSYCHIATRIC DISORDERS Anxiety Depression negative neurological ROS     GI/Hepatic negative GI ROS, Neg liver ROS,   Endo/Other  negative endocrine ROS  Renal/GU negative Renal ROS     Musculoskeletal   Abdominal   Peds  Hematology  (+) anemia ,   Anesthesia Other Findings   Reproductive/Obstetrics                             Anesthesia Physical Anesthesia Plan  ASA: II and emergent  Anesthesia Plan: MAC   Post-op Pain Management:    Induction: Intravenous  Airway Management Planned: Simple Face Mask and Natural Airway  Additional Equipment: None  Intra-op Plan:   Post-operative Plan:   Informed Consent: I have reviewed the patients History and Physical, chart, labs and discussed the procedure including the risks, benefits and alternatives for the proposed anesthesia with the patient or authorized representative who has indicated his/her understanding and acceptance.   Dental advisory given  Plan Discussed with: CRNA and Surgeon  Anesthesia Plan Comments:         Anesthesia Quick Evaluation

## 2014-12-06 NOTE — Addendum Note (Signed)
Addendum  created 12/06/14 1535 by Renford DillsJanet L Jaquelynn Wanamaker, CRNA   Modules edited: Notes Section   Notes Section:  File: [id number]

## 2014-12-07 ENCOUNTER — Telehealth: Payer: Self-pay

## 2014-12-07 ENCOUNTER — Encounter (HOSPITAL_COMMUNITY): Payer: Self-pay | Admitting: Family Medicine

## 2014-12-07 DIAGNOSIS — D649 Anemia, unspecified: Secondary | ICD-10-CM | POA: Diagnosis not present

## 2014-12-07 DIAGNOSIS — O036 Delayed or excessive hemorrhage following complete or unspecified spontaneous abortion: Secondary | ICD-10-CM | POA: Diagnosis not present

## 2014-12-07 LAB — TYPE AND SCREEN
ABO/RH(D): A POS
ANTIBODY SCREEN: NEGATIVE
UNIT DIVISION: 0
UNIT DIVISION: 0
UNIT DIVISION: 0
UNIT DIVISION: 0

## 2014-12-07 LAB — CBC
HCT: 26.6 % — ABNORMAL LOW (ref 36.0–46.0)
Hemoglobin: 9.1 g/dL — ABNORMAL LOW (ref 12.0–15.0)
MCH: 29.8 pg (ref 26.0–34.0)
MCHC: 34.2 g/dL (ref 30.0–36.0)
MCV: 87.2 fL (ref 78.0–100.0)
Platelets: 160 10*3/uL (ref 150–400)
RBC: 3.05 MIL/uL — AB (ref 3.87–5.11)
RDW: 14.3 % (ref 11.5–15.5)
WBC: 5.5 10*3/uL (ref 4.0–10.5)

## 2014-12-07 NOTE — Telephone Encounter (Signed)
Left message to schedule follow up appointment from ED

## 2014-12-07 NOTE — Discharge Summary (Signed)
Physician Discharge Summary  Patient ID: Vanessa Larson MRN: [id number] DOB/AGE: 33/17/1982 33 y.o.  Admit date: 12/06/2014 Discharge date:   Admission Diagnoses:  Principal Problem:   Symptomatic anemia Active Problems:   Abortion, spontaneous with hemorrhage   Acute blood loss anemia   Discharge Diagnoses:  Same  Past Medical History  Diagnosis Date  . Anxiety   . Depression   . Pregnancy related condition in third trimester 2010    [redacted] weeks gestation complicated by depression/anxiety  . H/O abnormal Pap smear     Surgeries: Procedure(s): DILATATION AND EVACUATION on 12/06/2014   Consultants:  None  Discharged Condition: Improved  Hospital Course: Vanessa Larson is an 34 y.o. female [id number] who was admitted 12/06/2014 with a chief complaint of  Chief Complaint  Patient presents with  . Vaginal Bleeding   and found to have a diagnosis of Symptomatic anemia following an incomplete AB.  They were brought to the operating room on 12/06/2014 and underwent the above named procedures.    They were given perioperative antibiotics:  Anti-infectives    Start     Dose/Rate Route Frequency Ordered Stop   12/06/14 1015  doxycycline (VIBRAMYCIN) 100 mg in dextrose 5 % 250 mL IVPB     100 mg125 mL/hr over 120 Minutes Intravenous On call to O.R. 12/06/14 1000 12/06/14 1009    . She received 2 u PRBC's intraoperatively They were given sequential compression devices, early ambulation, and chemoprophylaxis for DVT prophylaxis.  They benefited maximally from their hospital stay and there were no complications.  Post operative Hgb was stable at 9.1.  Recent vital signs:  Filed Vitals:   12/07/14 0534  BP: 98/58  Pulse: 75  Temp: 98.7 F (37.1 C)  Resp: 18  Discharge exam: Physical Examination: General appearance - alert, well appearing, and in no distress Abdomen - soft, nontender, nondistended, no masses or organomegaly Extremities - peripheral pulses normal, no pedal edema,  no clubbing or cyanosis   Recent laboratory studies:  Results for orders placed or performed during the hospital encounter of 12/06/14  Surgical pcr screen  Result Value Ref Range   MRSA, PCR NEGATIVE NEGATIVE   Staphylococcus aureus NEGATIVE NEGATIVE  CBC  Result Value Ref Range   WBC 6.1 4.0 - 10.5 K/uL   RBC 2.97 (L) 3.87 - 5.11 MIL/uL   Hemoglobin 8.7 (L) 12.0 - 15.0 g/dL   HCT 14.7 (L) 82.9 - 56.2 %   MCV 87.5 78.0 - 100.0 fL   MCH 29.3 26.0 - 34.0 pg   MCHC 33.5 30.0 - 36.0 g/dL   RDW 13.0 86.5 - 78.4 %   Platelets 194 150 - 400 K/uL  hCG, quantitative, pregnancy  Result Value Ref Range   hCG, Beta Chain, Quant, S 1054 (H) <5 mIU/mL  CBC  Result Value Ref Range   WBC 5.9 4.0 - 10.5 K/uL   RBC 3.15 (L) 3.87 - 5.11 MIL/uL   Hemoglobin 9.5 (L) 12.0 - 15.0 g/dL   HCT 69.6 (L) 29.5 - 28.4 %   MCV 87.3 78.0 - 100.0 fL   MCH 30.2 26.0 - 34.0 pg   MCHC 34.5 30.0 - 36.0 g/dL   RDW 13.2 44.0 - 10.2 %   Platelets 150 150 - 400 K/uL  CBC  Result Value Ref Range   WBC 5.5 4.0 - 10.5 K/uL   RBC 3.05 (L) 3.87 - 5.11 MIL/uL   Hemoglobin 9.1 (L) 12.0 - 15.0 g/dL   HCT 72.5 (L)  36.0 - 46.0 %   MCV 87.2 78.0 - 100.0 fL   MCH 29.8 26.0 - 34.0 pg   MCHC 34.2 30.0 - 36.0 g/dL   RDW 45.4 09.8 - 11.9 %   Platelets 160 150 - 400 K/uL  Type and screen  Result Value Ref Range   ABO/RH(D) A POS    Antibody Screen NEG    Sample Expiration 12/09/2014    Unit Number [id number]    Blood Component Type RBC CPDA1, LR    Unit division 00    Status of Unit ISSUED    Transfusion Status OK TO TRANSFUSE    Crossmatch Result Compatible    Unit Number [id number]    Blood Component Type RED CELLS,LR    Unit division 00    Status of Unit ISSUED    Transfusion Status OK TO TRANSFUSE    Crossmatch Result Compatible    Unit Number [id number]    Blood Component Type RED CELLS,LR    Unit division 00    Status of Unit REL FROM [facility]    Transfusion Status OK TO TRANSFUSE    Crossmatch  Result Compatible    Unit Number [id number]    Blood Component Type RED CELLS,LR    Unit division 00    Status of Unit REL FROM [facility]    Transfusion Status OK TO TRANSFUSE    Crossmatch Result Compatible   Prepare RBC  Result Value Ref Range   Order Confirmation ORDER PROCESSED BY BLOOD BANK     Discharge Medications:     Medication List    STOP taking these medications        Doxylamine-Pyridoxine 10-10 MG Tbec  Commonly known as:  DICLEGIS     oxyCODONE-acetaminophen 5-325 MG per tablet  Commonly known as:  PERCOCET/ROXICET     prenatal multivitamin Tabs tablet     promethazine 25 MG tablet  Commonly known as:  PHENERGAN      TAKE these medications        acetaminophen 325 MG tablet  Commonly known as:  TYLENOL  Take 650 mg by mouth every 6 (six) hours as needed for headache.     buPROPion 300 MG 24 hr tablet  Commonly known as:  WELLBUTRIN XL  Take 1 tablet (300 mg total) by mouth daily.     ibuprofen 200 MG tablet  Commonly known as:  ADVIL,MOTRIN  Take 600 mg by mouth every 6 (six) hours as needed for moderate pain.        Diagnostic Studies: US Ob Comp Less 14 Wks  December 18, 2014   CLINICAL DATA:  Recent abortion, with heavy bleeding. Assess for retained products of conception. Initial encounter.  EXAM: OBSTETRIC <14 WK [place] AND TRANSVAGINAL OB US  TECHNIQUE: Both transabdominal and transvaginal ultrasound examinations were performed for complete evaluation of the gestation as well as the maternal uterus, adnexal regions, and pelvic cul-de-sac. Transvaginal technique was performed to assess early pregnancy.  COMPARISON:  None.  FINDINGS: Intrauterine gestational sac:  None seen.  Yolk sac:  N/A  Embryo:  N/A  Maternal uterus/adnexae: The endometrial echo complex is thickened, measuring 1.6 cm, likely reflecting some amount of blood and clot. No associated blood flow is seen on limited color Doppler evaluation to suggest retained products of conception.  The ovaries  are unremarkable in appearance. The right ovary measures 2.7 x 1.9 x 1.7 cm, while the left ovary measures 4.1 x 2.8 x 2.2 cm. No suspicious adnexal masses are seen; there  is no evidence for ovarian torsion.  Trace free fluid is seen within the pelvic cul-de-sac.  IMPRESSION: No evidence for retained products of conception. Thickening of the endometrial echo complex likely reflect some degree of underlying blood and clot. Uterus otherwise grossly unremarkable.   Electronically Signed   By: Roanna RaiderJeffery  Chang M.D.   On: 12/01/2014 01:48   [place] Ob Transvaginal  12/01/2014   CLINICAL DATA:  Recent abortion, with heavy bleeding. Assess for retained products of conception. Initial encounter.  EXAM: OBSTETRIC <14 WK US AND TRANSVAGINAL OB US  TECHNIQUE: Both transabdominal and transvaginal ultrasound examinations were performed for complete evaluation of the gestation as well as the maternal uterus, adnexal regions, and pelvic cul-de-sac. Transvaginal technique was performed to assess early pregnancy.  COMPARISON:  None.  FINDINGS: Intrauterine gestational sac:  None seen.  Yolk sac:  N/A  Embryo:  N/A  Maternal uterus/adnexae: The endometrial echo complex is thickened, measuring 1.6 cm, likely reflecting some amount of blood and clot. No associated blood flow is seen on limited color Doppler evaluation to suggest retained products of conception.  The ovaries are unremarkable in appearance. The right ovary measures 2.7 x 1.9 x 1.7 cm, while the left ovary measures 4.1 x 2.8 x 2.2 cm. No suspicious adnexal masses are seen; there is no evidence for ovarian torsion.  Trace free fluid is seen within the pelvic cul-de-sac.  IMPRESSION: No evidence for retained products of conception. Thickening of the endometrial echo complex likely reflect some degree of underlying blood and clot. Uterus otherwise grossly unremarkable.   Electronically Signed   By: Roanna RaiderJeffery  Chang M.D.   On: 12/01/2014 01:48   [place] Transvaginal Non-ob  12/06/2014    CLINICAL DATA:  Increased heavy vaginal bleeding with clots status post missed abortion.  EXAM: ULTRASOUND PELVIS TRANSVAGINAL  TECHNIQUE: Transvaginal ultrasound examination of the pelvis was performed including evaluation of the uterus, ovaries, adnexal regions, and pelvic cul-de-sac.  COMPARISON:  Ultrasound 12/01/2014  FINDINGS: Uterus  Measurements: 9.6 x 4.7 x 5.8 cm. No fibroids or other mass visualized.  Endometrium  Thickness: Remains thickened measuring 2.2 cm with heterogeneous echotexture. No definite associated fluid. There is no hyperemia to suggest retained products of conception.  Right ovary  Measurements: 3.0 x 1.7 x 1.6 cm. Normal appearance/no adnexal mass.  Left ovary  Not well seen currently.  Other findings:  No free fluid  IMPRESSION: Thickened heterogeneous endometrium. There is no abnormal vascularity to suggest retained products of conception.   Electronically Signed   By: Rubye OaksMelanie  Ehinger M.D.   On: 12/06/2014 06:11    Disposition: 01-Home or Self Care      Discharge Instructions    Call MD for:  persistant nausea and vomiting    Complete by:  As directed      Call MD for:  severe uncontrolled pain    Complete by:  As directed      Call MD for:  temperature >100.4    Complete by:  As directed      Call MD for:    Complete by:  As directed   Heavy vaginal bleeding     Diet - low sodium heart healthy    Complete by:  As directed      Increase activity slowly    Complete by:  As directed      No wound care    Complete by:  As directed      Sexual Activity Restrictions    Complete by:  As directed   None until you stop bleeding           Follow-up Information    Follow up with [facility] [place] In 3 days.   Specialty:  Obstetrics and Gynecology   Why:  They will call you with appointment   Contact information:   [address], [address] [place] [address] [phone]       Signed: Reva Bores 12/07/2014, 8:04 AM

## 2014-12-07 NOTE — Progress Notes (Signed)
Pt verbalizes understanding of d/c instructions, medications, follow up appts, when to seek medical attention and belongings policy. Pt has no questions at this time. IV was removed without complications. Pt is waiting for her mother to arrive to drive her home at this time. [name], Hoorain Kozakiewicz Warner

## 2014-12-07 NOTE — Progress Notes (Signed)
Cdiff screen cancelled d/t this test being including in GI panel that was ordered. Vanessa Larson, Vanessa Larson

## 2014-12-07 NOTE — Progress Notes (Signed)
Pt c/o diarrhea, having 3 loose/watery stools this morning. Cdiff protocol initiated. Informed pt that would be testing for Cdiff, pt then informed me that this has been ongoing for about 12 weeks (having approx 3 stools/day). Resident on call was made aware, order received to perform full GI panel on stool sample that has been collected. [name], [name]

## 2014-12-07 NOTE — Progress Notes (Signed)
Vanessa Larson is processing her miscarriage, along with all of the physical difficulties she has been through related to it.  She is making meaning from her experience and already finding ways to use her own experience to help others.  We spoke about how to help her children process, as well as the differing ways that she and her husband are coping. She received resource information from [employer] and [employer] [facility] as well as information about how to contact us for ongoing support.  I offered grief support, grief information and reflective listening as she told her story.  [address]Chaplain Dyanne CarrelKaty Mick Tanguma, Bcc Pager, [phone] 2:43 PM    12/07/14 1400  Clinical Encounter Type  Visited With Patient  Visit Type Spiritual support  Spiritual Encounters  Spiritual Needs Emotional;Grief support  Stress Factors  Patient Stress Factors Loss

## 2014-12-07 NOTE — Discharge Instructions (Signed)
Dilation and Curettage or Vacuum Curettage, Care After  Refer to this sheet in the next few weeks. These instructions provide you with information on caring for yourself after your procedure. Your health care provider may also give you more specific instructions. Your treatment has been planned according to current medical practices, but problems sometimes occur. Call your health care provider if you have any problems or questions after your procedure.  WHAT TO EXPECT AFTER THE PROCEDURE  After your procedure, it is typical to have light cramping and bleeding. This may last for 2 days to 2 weeks after the procedure.  HOME CARE INSTRUCTIONS   · Do not drive for 24 hours.  · Wait 1 week before returning to strenuous activities.  · Take your temperature 2 times a day for 4 days and write it down. Provide these temperatures to your health care provider if you develop a fever.  · Avoid long periods of standing.  · Avoid heavy lifting, pushing, or pulling. Do not lift anything heavier than 10 pounds (4.5 kg).  · Limit stair climbing to once or twice a day.  · Take rest periods often.  · You may resume your usual diet.  · Drink enough fluids to keep your urine clear or pale yellow.  · Your usual bowel function should return. If you have constipation, you may:  ¨ Take a mild laxative with permission from your health care provider.  ¨ Add fruit and bran to your diet.  ¨ Drink more fluids.  · Take showers instead of baths until your health care provider gives you permission to take baths.  · Do not go swimming or use a hot tub until your health care provider approves.  · Try to have someone with you or available to you the first 24-48 hours, especially if you were given a general anesthetic.  · Do not douche, use tampons, or have sex (intercourse) for 2 weeks after the procedure.  · Only take over-the-counter or prescription medicines as directed by your health care provider. Do not take aspirin. It can cause  bleeding.  · Follow up with your health care provider as directed.  SEEK MEDICAL CARE IF:   · You have increasing cramps or pain that is not relieved with medicine.  · You have abdominal pain that does not seem to be related to the same area of earlier cramping and pain.  · You have bad smelling vaginal discharge.  · You have a rash.  · You are having problems with any medicine.  SEEK IMMEDIATE MEDICAL CARE IF:   · You have bleeding that is heavier than a normal menstrual period.  · You have a fever.  · You have chest pain.  · You have shortness of breath.  · You feel dizzy or feel like fainting.  · You pass out.  · You have pain in your shoulder strap area.  · You have heavy vaginal bleeding with or without blood clots.  MAKE SURE YOU:   · Understand these instructions.  · Will watch your condition.  · Will get help right away if you are not doing well or get worse.  Document Released: 10/13/2000 Document Revised: 10/21/2013 Document Reviewed: 05/15/2013  Exit[employer]® Patient Information ©2015 [employer]. This information is not intended to replace advice given to you by your health care provider. Make sure you discuss any questions you have with your health care provider.

## 2014-12-09 LAB — GI PATHOGEN PANEL BY PCR, STOOL
C difficile toxin A/B: NOT DETECTED
CRYPTOSPORIDIUM BY PCR: NOT DETECTED
Campylobacter by PCR: NOT DETECTED
E coli (ETEC) LT/ST: NOT DETECTED
E coli (STEC): NOT DETECTED
E coli 0157 by PCR: NOT DETECTED
G LAMBLIA BY PCR: NOT DETECTED
NOROVIRUS G1/G2: NOT DETECTED
ROTAVIRUS A BY PCR: NOT DETECTED
SALMONELLA BY PCR: NOT DETECTED
SHIGELLA BY PCR: NOT DETECTED

## 2014-12-10 ENCOUNTER — Ambulatory Visit (INDEPENDENT_AMBULATORY_CARE_PROVIDER_SITE_OTHER): Payer: BLUE CROSS/BLUE SHIELD | Admitting: Obstetrics & Gynecology

## 2014-12-10 ENCOUNTER — Encounter: Payer: Self-pay | Admitting: Obstetrics & Gynecology

## 2014-12-10 VITALS — BP 120/74 | HR 98 | Resp 16 | Ht 65.0 in | Wt 156.0 lb

## 2014-12-10 DIAGNOSIS — Z9889 Other specified postprocedural states: Secondary | ICD-10-CM

## 2014-12-10 NOTE — Progress Notes (Signed)
Pt sent to OR when first unit of blood was administered. Pt was in OR before her first 15 minute vital signs were obtained.

## 2014-12-10 NOTE — Progress Notes (Signed)
   Subjective:    Patient ID: Vanessa HartshornMolly M Ferran, female    DOB: October 27, 1981, 34 y.o.   MRN: [id number]  HPI 34 yo P4 here 4 days after her d&c done for retained POC after a miscarriage treated with cytotec. Her hbg came up to 9.1 after her 2 units. She is here because she wants to make sure that her BRB is normal. It was initially dark brown after her d&c. It started to be bright red 1-2 days ago. It is not heavy. She can use a panty liner. She denies any fever or pelvic pain.   Review of Systems     Objective:   Physical Exam Breathing and walking normally       Assessment & Plan:  Status post d&c- reassurance given RTC 5 weeks/prn sooner

## 2014-12-14 ENCOUNTER — Ambulatory Visit: Payer: BLUE CROSS/BLUE SHIELD | Admitting: Advanced Practice Midwife

## 2015-01-06 ENCOUNTER — Other Ambulatory Visit (INDEPENDENT_AMBULATORY_CARE_PROVIDER_SITE_OTHER): Payer: BLUE CROSS/BLUE SHIELD

## 2015-01-06 DIAGNOSIS — O039 Complete or unspecified spontaneous abortion without complication: Secondary | ICD-10-CM | POA: Diagnosis not present

## 2015-01-06 NOTE — Progress Notes (Signed)
Pt called stating that she had a SAB followed by a D&C on 12/06/14.  Today she took a UPT @ home and had a faint positive.  Will draw a BHCG today and evaluate accordingly.

## 2015-01-07 ENCOUNTER — Telehealth: Payer: Self-pay | Admitting: *Deleted

## 2015-01-07 LAB — HCG, QUANTITATIVE, PREGNANCY: hCG, Beta Chain, Quant, S: 2.5 m[IU]/mL

## 2015-01-07 NOTE — Telephone Encounter (Signed)
Pt notified of neg HCG results.

## 2015-01-13 ENCOUNTER — Encounter: Payer: BLUE CROSS/BLUE SHIELD | Admitting: Obstetrics & Gynecology

## 2015-01-18 ENCOUNTER — Encounter: Payer: BLUE CROSS/BLUE SHIELD | Admitting: Obstetrics & Gynecology

## 2015-01-19 ENCOUNTER — Other Ambulatory Visit: Payer: Self-pay | Admitting: Physician Assistant

## 2015-02-24 ENCOUNTER — Encounter: Payer: Self-pay | Admitting: Obstetrics & Gynecology

## 2015-02-24 ENCOUNTER — Ambulatory Visit (INDEPENDENT_AMBULATORY_CARE_PROVIDER_SITE_OTHER): Payer: BLUE CROSS/BLUE SHIELD | Admitting: Obstetrics & Gynecology

## 2015-02-24 VITALS — BP 112/69 | HR 81 | Resp 16 | Ht 65.0 in | Wt 152.0 lb

## 2015-02-24 DIAGNOSIS — Z3202 Encounter for pregnancy test, result negative: Secondary | ICD-10-CM | POA: Diagnosis not present

## 2015-02-24 DIAGNOSIS — N926 Irregular menstruation, unspecified: Secondary | ICD-10-CM

## 2015-02-24 DIAGNOSIS — L298 Other pruritus: Secondary | ICD-10-CM | POA: Diagnosis not present

## 2015-02-24 DIAGNOSIS — N910 Primary amenorrhea: Secondary | ICD-10-CM

## 2015-02-24 DIAGNOSIS — N898 Other specified noninflammatory disorders of vagina: Secondary | ICD-10-CM

## 2015-02-24 LAB — POCT URINE PREGNANCY: PREG TEST UR: NEGATIVE

## 2015-02-24 MED ORDER — FLUCONAZOLE 150 MG PO TABS: 150.0000 mg | ORAL_TABLET | Freq: Once | ORAL | Status: DC

## 2015-02-24 MED ORDER — LIDOCAINE HCL 2 % EX GEL: 1.0000 "application " | CUTANEOUS | Status: DC | PRN

## 2015-02-24 NOTE — Progress Notes (Signed)
   Subjective:    Patient ID: Vanessa HartshornMolly M Yeagle, female    DOB: November 02, 1980, 34 y.o.   MRN: [id number]  HPI  34 yo MW lady is here with the complaint of about a 1 month h/o vulvar (especially anterior to the clitoris and along the right labia minora). She is worried that this could be herpes. She sees some "cuts" in those areas.  Her period is 1 week late. She had a miscarriage and a d&c for retained POC. She is not using contracetion.  Review of Systems She has been monogamous for 9 years.    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

## 2015-02-24 NOTE — Addendum Note (Signed)
Addended by: Granville LewisLARK, Marina Desire L on: 02/24/2015 11:22 AM   Modules accepted: Orders

## 2015-02-25 ENCOUNTER — Telehealth: Payer: Self-pay | Admitting: Obstetrics & Gynecology

## 2015-02-25 LAB — HSV 2 ANTIBODY, IGG: HSV 2 Glycoprotein G Ab, IgG: 11.43 IV — ABNORMAL HIGH

## 2015-02-25 LAB — WET PREP FOR TRICH, YEAST, CLUE
Clue Cells Wet Prep HPF POC: NONE SEEN
Trich, Wet Prep: NONE SEEN
WBC WET PREP: NONE SEEN
Yeast Wet Prep HPF POC: NONE SEEN

## 2015-02-25 MED ORDER — VALACYCLOVIR HCL 1 G PO TABS: 2000.0000 mg | ORAL_TABLET | Freq: Two times a day (BID) | ORAL | Status: AC

## 2015-02-25 NOTE — Telephone Encounter (Signed)
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

## 2015-02-27 LAB — HSV 1 AND 2 IGM ABS, INDIRECT
HSV 1 IgM Abs: NEGATIVE
HSV 2 IgM Abs: NEGATIVE

## 2015-03-04 ENCOUNTER — Telehealth: Payer: Self-pay | Admitting: *Deleted

## 2015-03-04 NOTE — Telephone Encounter (Signed)
Pt called stating that she is having increase in vaginal discharge which is asymptomatic.  Her last LMP was 01/17/15 but is having neg pregnancy test.  She thinks she may have ovulated twice this month.  Will wait for 2 weeks and to a UPT @ that time or will return PRN

## 2015-03-30 ENCOUNTER — Other Ambulatory Visit (INDEPENDENT_AMBULATORY_CARE_PROVIDER_SITE_OTHER): Payer: BLUE CROSS/BLUE SHIELD

## 2015-03-30 DIAGNOSIS — Z3201 Encounter for pregnancy test, result positive: Secondary | ICD-10-CM | POA: Diagnosis not present

## 2015-03-30 NOTE — Progress Notes (Signed)
Pt called stating that she had a positive pregnancy test on Friday.  Her last period was March 20.  She has been using ovulation test.  Will draw BHCG today and f/u in 48 hrs.

## 2015-03-31 LAB — HCG, QUANTITATIVE, PREGNANCY: hCG, Beta Chain, Quant, S: 142.6 m[IU]/mL

## 2015-04-01 ENCOUNTER — Other Ambulatory Visit: Payer: BLUE CROSS/BLUE SHIELD

## 2015-04-01 ENCOUNTER — Telehealth: Payer: Self-pay

## 2015-04-01 DIAGNOSIS — Z3201 Encounter for pregnancy test, result positive: Secondary | ICD-10-CM

## 2015-04-01 LAB — HCG, QUANTITATIVE, PREGNANCY: hCG, Beta Chain, Quant, S: 434.9 m[IU]/mL

## 2015-04-01 NOTE — Telephone Encounter (Signed)
Patient made aware that her HCG more than doubled today (434.9) and scheduled her for a new ob visit. Patient denies any bleeding or pain. Patient to call back if any new symptoms arise. Armandina StammerJennifer Jeffory Snelgrove RN BSN 04-01-15

## 2015-04-05 ENCOUNTER — Other Ambulatory Visit (INDEPENDENT_AMBULATORY_CARE_PROVIDER_SITE_OTHER): Payer: BLUE CROSS/BLUE SHIELD

## 2015-04-05 ENCOUNTER — Telehealth: Payer: Self-pay | Admitting: *Deleted

## 2015-04-05 DIAGNOSIS — O3680X1 Pregnancy with inconclusive fetal viability, fetus 1: Secondary | ICD-10-CM | POA: Diagnosis not present

## 2015-04-05 DIAGNOSIS — Z3200 Encounter for pregnancy test, result unknown: Secondary | ICD-10-CM

## 2015-04-05 LAB — HCG, QUANTITATIVE, PREGNANCY: HCG, BETA CHAIN, QUANT, S: 2828 m[IU]/mL

## 2015-04-05 NOTE — Telephone Encounter (Signed)
Pt notified of BHCG results.  She insisted on coming in today for another repeat BHCG

## 2015-04-14 ENCOUNTER — Encounter: Payer: Self-pay | Admitting: Certified Nurse Midwife

## 2015-04-15 ENCOUNTER — Encounter: Payer: BLUE CROSS/BLUE SHIELD | Admitting: Obstetrics & Gynecology

## 2015-04-16 ENCOUNTER — Ambulatory Visit (INDEPENDENT_AMBULATORY_CARE_PROVIDER_SITE_OTHER): Payer: BLUE CROSS/BLUE SHIELD

## 2015-04-16 ENCOUNTER — Ambulatory Visit (INDEPENDENT_AMBULATORY_CARE_PROVIDER_SITE_OTHER): Payer: BLUE CROSS/BLUE SHIELD | Admitting: Certified Nurse Midwife

## 2015-04-16 ENCOUNTER — Other Ambulatory Visit: Payer: Self-pay | Admitting: Certified Nurse Midwife

## 2015-04-16 ENCOUNTER — Encounter: Payer: Self-pay | Admitting: Certified Nurse Midwife

## 2015-04-16 VITALS — BP 116/71 | HR 92 | Wt 153.0 lb

## 2015-04-16 DIAGNOSIS — Z3491 Encounter for supervision of normal pregnancy, unspecified, first trimester: Secondary | ICD-10-CM

## 2015-04-16 DIAGNOSIS — Z3201 Encounter for pregnancy test, result positive: Secondary | ICD-10-CM

## 2015-04-16 DIAGNOSIS — N8320 Unspecified ovarian cysts: Secondary | ICD-10-CM

## 2015-04-16 DIAGNOSIS — Z3481 Encounter for supervision of other normal pregnancy, first trimester: Secondary | ICD-10-CM

## 2015-04-16 NOTE — Progress Notes (Signed)
Beside U/S showed gestational sac with fetal pole measuring 3.14mm and FHT 118.  There is some question about the size of sac.  Pt was sent for formal U/S

## 2015-04-16 NOTE — Progress Notes (Signed)
Subjective:    Vanessa Larson is a [id number] Unknown being seen today for her first obstetrical visit.  Her obstetrical history is significant for miscarriage, previous c/s, twins. Patient does intend to breast feed. Pregnancy history fully reviewed.  Patient reports no complaints.  [facility] Prenatal Labs  Dating ultrasound Blood type: --/--/A POS (02/07 0450)   Genetic Screen 1 Screen:  decined  AFP:   declined  Quad:     NIPS: Antibody:NEG (02/07 0450)  Anatomic [place]  Rubella: 0.86 (12/28 1017)  GTT Early:               Third trimester:  RPR: NON REAC (12/28 1017)   Flu vaccine  HBsAg: NEGATIVE (12/28 1017)   TDaP vaccine                                               Rhogam: HIV: NONREACTIVE (12/28 1017)   GBS                                              (For PCN allergy, check sensitivities) GBS:   Baby Food Breast  Pap:  Contraception    Circumcision    Pediatrician    Support Person husband     Filed Vitals:   04/16/15 0852  BP: 116/71  Weight: 153 lb (69.4 kg)    HISTORY: OB History  Gravida Para Term Preterm AB SAB TAB Ectopic Multiple Living  # Outcome Date GA Lbr Len/2nd Weight Sex Delivery Anes PTL Lv  5 Current           4 Term 01/27/13 [redacted]w[redacted]d 03:13 / 00:38 8 lb 11.7 oz (3.96 kg) M Vag-Spont None  Y  3 Term 09/26/11 [redacted]w[redacted]d 72:21 / 00:33 8 lb 6.8 oz (3.822 kg) F Vag-Spont None  Y     Comments: none  2A Term 03/27/09 [redacted]w[redacted]d  6 lb 3 oz (2.807 kg) M CS-Unspec EPI N Y  2B Term 03/27/09 [redacted]w[redacted]d  6 lb 7 oz (2.92 kg) M CS-Unspec EPI N Y  1 SAB              Past Medical History  Diagnosis Date  . Anxiety   . Depression   . Pregnancy related condition in third trimester 2010    [redacted] weeks gestation complicated by depression/anxiety  . H/O abnormal Pap smear    Past Surgical History  Procedure Laterality Date  . Cesarean section  2010    x 1  . Wisdom tooth extraction    . Dilation and evacuation N/A 12/06/2014    Procedure: DILATATION  AND EVACUATION;  Surgeon: Reva Bores, MD;  Location: [facility];  Service: Gynecology;  Laterality: N/A;   Family History  Problem Relation Age of Onset  . Depression Mother   . Stroke Maternal Grandmother   . Cancer Maternal Grandfather      Exam    Uterus:     Pelvic Exam:    Perineum: Normal Perineum   Vulva: normal   Vagina:     pH:    Cervix:    Adnexa:    Bony Pelvis: gynecoid  System: Breast:  Skin: normal coloration and turgor, no rashes    Neurologic: oriented, normal   Extremities: normal strength, tone, and muscle mass   HEENT    Mouth/Teeth mucous membranes moist, pharynx normal without lesions   Neck supple and no masses   Cardiovascular: regular rate and rhythm   Respiratory:  appears well, vitals normal, no respiratory distress, acyanotic, normal RR, ear and throat exam is normal, neck free of mass or lymphadenopathy, chest clear, no wheezing, crepitations, rhonchi, normal symmetric air entry   Abdomen: soft, non-tender; bowel sounds normal; no masses,  no organomegaly   Urinary:       Assessment:    Pregnancy: [id number] Patient Active Problem List   Diagnosis Date Noted  . Symptomatic anemia 12/06/2014  . Acute blood loss anemia 12/02/2014  . Orthostasis 12/01/2014  . Abortion, spontaneous with hemorrhage 12/01/2014  . History of cesarean delivery affecting pregnancy 10/26/2014  . Anxiety attack 11/06/2013  . Anxiety 02/19/2012  . Depression 09/11/2011  . 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 Send to Ultrasound to determine if singleton or twins Prenatal vitamins. Problem list reviewed and updated. Genetic Screening discussed First Screen and Quad Screen: declined.  Ultrasound discussed; fetal survey: requested.  Follow up in 4 weeks. 50% of 30 min visit spent on counseling and coordination of care.    Clemmons,Lori  Grissett 04/16/2015

## 2015-04-17 LAB — GC/CHLAMYDIA PROBE AMP
CT Probe RNA: NEGATIVE
GC Probe RNA: NEGATIVE

## 2015-04-17 LAB — HIV ANTIBODY (ROUTINE TESTING W REFLEX): HIV 1&2 Ab, 4th Generation: NONREACTIVE

## 2015-04-18 LAB — OBSTETRIC PANEL
Antibody Screen: NEGATIVE
Basophils Absolute: 0 10*3/uL (ref 0.0–0.1)
Basophils Relative: 0 % (ref 0–1)
Eosinophils Absolute: 0.1 10*3/uL (ref 0.0–0.7)
Eosinophils Relative: 1 % (ref 0–5)
HCT: 36.8 % (ref 36.0–46.0)
Hemoglobin: 12.1 g/dL (ref 12.0–15.0)
Hepatitis B Surface Ag: NEGATIVE
Lymphocytes Relative: 19 % (ref 12–46)
Lymphs Abs: 1.1 10*3/uL (ref 0.7–4.0)
MCH: 26.4 pg (ref 26.0–34.0)
MCHC: 32.9 g/dL (ref 30.0–36.0)
MCV: 80.3 fL (ref 78.0–100.0)
MPV: 9.6 fL (ref 8.6–12.4)
Monocytes Absolute: 0.4 10*3/uL (ref 0.1–1.0)
Monocytes Relative: 6 % (ref 3–12)
Neutro Abs: 4.4 10*3/uL (ref 1.7–7.7)
Neutrophils Relative %: 74 % (ref 43–77)
Platelets: 200 10*3/uL (ref 150–400)
RBC: 4.58 MIL/uL (ref 3.87–5.11)
RDW: 17.1 % — ABNORMAL HIGH (ref 11.5–15.5)
Rh Type: POSITIVE
Rubella: 0.96 Index — ABNORMAL HIGH (ref <0.90)
WBC: 6 10*3/uL (ref 4.0–10.5)

## 2015-04-18 LAB — CULTURE, URINE COMPREHENSIVE
Colony Count: NO GROWTH
Organism ID, Bacteria: NO GROWTH

## 2015-04-29 ENCOUNTER — Encounter: Payer: Self-pay | Admitting: Obstetrics & Gynecology

## 2015-04-29 ENCOUNTER — Ambulatory Visit (INDEPENDENT_AMBULATORY_CARE_PROVIDER_SITE_OTHER): Payer: BLUE CROSS/BLUE SHIELD | Admitting: Obstetrics & Gynecology

## 2015-04-29 VITALS — BP 123/76 | HR 100 | Wt 152.0 lb

## 2015-04-29 DIAGNOSIS — B3731 Acute candidiasis of vulva and vagina: Secondary | ICD-10-CM

## 2015-04-29 DIAGNOSIS — F329 Major depressive disorder, single episode, unspecified: Secondary | ICD-10-CM

## 2015-04-29 DIAGNOSIS — F32A Depression, unspecified: Secondary | ICD-10-CM

## 2015-04-29 DIAGNOSIS — Z3491 Encounter for supervision of normal pregnancy, unspecified, first trimester: Secondary | ICD-10-CM

## 2015-04-29 DIAGNOSIS — Z3481 Encounter for supervision of other normal pregnancy, first trimester: Secondary | ICD-10-CM

## 2015-04-29 DIAGNOSIS — B373 Candidiasis of vulva and vagina: Secondary | ICD-10-CM

## 2015-04-29 NOTE — Progress Notes (Signed)
Subjective:  Vanessa HartshornMolly M Larson is a 34 y.o. [id number] at 5865w2d being seen today for ongoing prenatal care.  Patient reports no complaints.   .  Vag. Bleeding: None.  . Denies leaking of fluid.   The following portions of the patient's history were reviewed and updated as appropriate: allergies, current medications, past family history, past medical history, past social history, past surgical history and problem list.   Objective:   Filed Vitals:   04/29/15 1025  BP: 123/76  Pulse: 100  Weight: 152 lb (68.947 kg)    Fetal Status: Fetal Heart Rate (bpm): 176         General:  Alert, oriented and cooperative. Patient is in no acute distress.  Skin: Skin is warm and dry. No rash noted.   Cardiovascular: Normal heart rate noted  Respiratory: Normal respiratory effort, no problems with respiration noted  Abdomen: Soft, gravid, appropriate for gestational age.       Vaginal: Vag. Bleeding: None.    Vag D/C Character: Thin  Cervix: Not evaluated        Extremities: Normal range of motion.  Edema: None  Mental Status: Normal mood and affect. Normal behavior. Normal judgment and thought content.   Urinalysis: Urine Protein: Negative Urine Glucose: Negative  Assessment and Plan:  Pregnancy: [id number] at 3365w2d  1. Vaginal yeast infection - WET PREP BY MOLECULAR PROBE  2. Depression Well controlled off meds  3. Supervision of low-risk pregnancy, first trimester 81171 FH by US.  Has large left ovarian cyst (see official US and see today.  Stable. Patient considering first screen.   - AMB Referral to Maternal Fetal Medicine (MFM)--first screen.  Patient will cancel if she decides not to proceed. -Needs q2 week reassurance--not candidate for baby scripts.   Preterm labor symptoms and general obstetric precautions including but not limited to vaginal bleeding, contractions, leaking of fluid and fetal movement were reviewed in detail with the patient.  Please refer to After Visit Summary for other  counseling recommendations.   Return in about 2 weeks (around 05/13/2015).   Lesly DukesKelly H Jeffie Widdowson, MD

## 2015-04-30 ENCOUNTER — Encounter: Payer: BLUE CROSS/BLUE SHIELD | Admitting: Advanced Practice Midwife

## 2015-04-30 ENCOUNTER — Telehealth: Payer: Self-pay | Admitting: *Deleted

## 2015-04-30 DIAGNOSIS — B3731 Acute candidiasis of vulva and vagina: Secondary | ICD-10-CM

## 2015-04-30 DIAGNOSIS — B373 Candidiasis of vulva and vagina: Secondary | ICD-10-CM

## 2015-04-30 LAB — WET PREP BY MOLECULAR PROBE
Candida species: POSITIVE — AB
Gardnerella vaginalis: NEGATIVE
Trichomonas vaginosis: NEGATIVE

## 2015-04-30 MED ORDER — FLUCONAZOLE 150 MG PO TABS
150.0000 mg | ORAL_TABLET | Freq: Once | ORAL | Status: DC
Start: 2015-04-30 — End: 2015-05-28

## 2015-04-30 NOTE — Telephone Encounter (Signed)
RX for Diflucan 150 mg sent to  [facility]  And message left for pt with results per Dr Penne LashLeggett.

## 2015-04-30 NOTE — Telephone Encounter (Signed)
-----   Message from Lesly DukesKelly H Leggett, MD sent at 04/30/2015  6:23 AM EDT ----- Treat per protocol

## 2015-05-13 ENCOUNTER — Ambulatory Visit (INDEPENDENT_AMBULATORY_CARE_PROVIDER_SITE_OTHER): Payer: BLUE CROSS/BLUE SHIELD | Admitting: *Deleted

## 2015-05-13 DIAGNOSIS — Z3491 Encounter for supervision of normal pregnancy, unspecified, first trimester: Secondary | ICD-10-CM | POA: Diagnosis not present

## 2015-05-28 ENCOUNTER — Encounter: Payer: Self-pay | Admitting: Advanced Practice Midwife

## 2015-05-28 ENCOUNTER — Ambulatory Visit (INDEPENDENT_AMBULATORY_CARE_PROVIDER_SITE_OTHER): Payer: BLUE CROSS/BLUE SHIELD | Admitting: Advanced Practice Midwife

## 2015-05-28 VITALS — BP 101/63 | HR 79 | Wt 154.0 lb

## 2015-05-28 DIAGNOSIS — O3481 Maternal care for other abnormalities of pelvic organs, first trimester: Secondary | ICD-10-CM

## 2015-05-28 DIAGNOSIS — Z3491 Encounter for supervision of normal pregnancy, unspecified, first trimester: Secondary | ICD-10-CM

## 2015-05-28 DIAGNOSIS — Z3480 Encounter for supervision of other normal pregnancy, unspecified trimester: Secondary | ICD-10-CM | POA: Insufficient documentation

## 2015-05-28 DIAGNOSIS — N83209 Unspecified ovarian cyst, unspecified side: Secondary | ICD-10-CM | POA: Insufficient documentation

## 2015-05-28 DIAGNOSIS — Z3481 Encounter for supervision of other normal pregnancy, first trimester: Secondary | ICD-10-CM

## 2015-05-28 DIAGNOSIS — N8320 Unspecified ovarian cysts: Secondary | ICD-10-CM

## 2015-05-28 HISTORY — DX: Unspecified ovarian cyst, unspecified side: N83.209

## 2015-05-28 NOTE — Progress Notes (Signed)
Subjective:  Vanessa Larson is a 34 y.o. [id number] at [redacted]w[redacted]d being seen today for ongoing prenatal care.  Patient reports few, mild pulling sensations in groin.   .  Vag. Bleeding: None.  . Denies leaking of fluid. Many questions about pro sand cons of first trimester screen vs NIPS. Worried about high false pos of former.   The following portions of the patient's history were reviewed and updated as appropriate: allergies, current medications, past family history, past medical history, past social history, past surgical history and problem list.   Objective:   Filed Vitals:   05/28/15 0829  BP: 101/63  Pulse: 79  Weight: 154 lb (69.854 kg)    Fetal Status: Fetal Heart Rate (bpm): 164         General:  Alert, oriented and cooperative. Patient is in no acute distress.  Skin: Skin is warm and dry. No rash noted.   Cardiovascular: Normal heart rate noted  Respiratory: Normal respiratory effort, no problems with respiration noted  Abdomen: Soft, gravid, appropriate for gestational age.       Vaginal: Vag. Bleeding: None.    Vag D/C Character: Thin  Cervix: Not evaluated        Extremities: Normal range of motion.  Edema: None  Mental Status: Normal mood and affect. Normal behavior. Normal judgment and thought content.   Urinalysis: Urine Protein: Negative Urine Glucose: Negative  Assessment and Plan:  Pregnancy: [id number] at [redacted]w[redacted]d  There are no diagnoses linked to this encounter. Preterm labor symptoms and general obstetric precautions including but not limited to vaginal bleeding, contractions, leaking of fluid and fetal movement were reviewed in detail with the patient. Please refer to After Visit Summary for other counseling recommendations.  Return in about 2 weeks (around 06/11/2015).  Lengthy conversation about NIPS vs First trimester screen. Wants to cancel First trimester screen and draw NIPS.    Dorathy Kinsman, CNM

## 2015-05-31 ENCOUNTER — Ambulatory Visit (HOSPITAL_COMMUNITY): Payer: BLUE CROSS/BLUE SHIELD

## 2015-05-31 ENCOUNTER — Other Ambulatory Visit (HOSPITAL_COMMUNITY): Payer: BLUE CROSS/BLUE SHIELD

## 2015-06-03 NOTE — Progress Notes (Signed)
Pt here for bedside U/S to check for FHT and to reassure pt of viability on U/S since she had recently had a SAB.  Dr Penne Lash has discussed this with patient in depth and will permit her to have the U/S's until she feels movemnt.

## 2015-06-11 ENCOUNTER — Encounter (INDEPENDENT_AMBULATORY_CARE_PROVIDER_SITE_OTHER): Payer: BLUE CROSS/BLUE SHIELD | Admitting: *Deleted

## 2015-06-11 ENCOUNTER — Other Ambulatory Visit: Payer: BLUE CROSS/BLUE SHIELD | Admitting: *Deleted

## 2015-06-11 DIAGNOSIS — Z3481 Encounter for supervision of other normal pregnancy, first trimester: Secondary | ICD-10-CM

## 2015-06-11 DIAGNOSIS — Z3491 Encounter for supervision of normal pregnancy, unspecified, first trimester: Secondary | ICD-10-CM

## 2015-06-14 NOTE — Progress Notes (Signed)
Pt here for OB US to confirm viability. FHR and fetal movement seen on [place]. Pt reassured.

## 2015-06-15 NOTE — Progress Notes (Signed)
This encounter was created in error - please disregard.

## 2015-06-21 ENCOUNTER — Telehealth: Payer: Self-pay | Admitting: *Deleted

## 2015-06-21 DIAGNOSIS — N83209 Unspecified ovarian cyst, unspecified side: Secondary | ICD-10-CM

## 2015-06-21 NOTE — Telephone Encounter (Signed)
-----   Message from Lesly Dukes, MD sent at 06/20/2015  5:30 PM EDT ----- Regarding: FW: Ovarian cyst Is she scheduled for an official US to look at the cyst.  If not, please, let's do this.  I've sent 2 other inbasket messages. ----- Message -----    From: Dorathy Kinsman, CNM    Sent: 05/30/2015  12:16 PM      To: Lesly Dukes, MD Subject: Ovarian cyst                                   Any special F/U for large ovarian cyst?. Pt is 13 weeks now. No pain.

## 2015-06-21 NOTE — Telephone Encounter (Signed)
F/U U/S scheduled for Friday 06/25/15 @ 9:45.  Reevaluating ovarian cyst per Dr Penne Lash.

## 2015-06-25 ENCOUNTER — Ambulatory Visit (INDEPENDENT_AMBULATORY_CARE_PROVIDER_SITE_OTHER): Payer: BLUE CROSS/BLUE SHIELD | Admitting: Advanced Practice Midwife

## 2015-06-25 ENCOUNTER — Other Ambulatory Visit: Payer: Self-pay | Admitting: Obstetrics & Gynecology

## 2015-06-25 ENCOUNTER — Ambulatory Visit (INDEPENDENT_AMBULATORY_CARE_PROVIDER_SITE_OTHER): Payer: BLUE CROSS/BLUE SHIELD

## 2015-06-25 VITALS — BP 113/60 | HR 110 | Wt 158.0 lb

## 2015-06-25 DIAGNOSIS — O3482 Maternal care for other abnormalities of pelvic organs, second trimester: Secondary | ICD-10-CM | POA: Diagnosis not present

## 2015-06-25 DIAGNOSIS — N83209 Unspecified ovarian cyst, unspecified side: Secondary | ICD-10-CM

## 2015-06-25 DIAGNOSIS — Z3492 Encounter for supervision of normal pregnancy, unspecified, second trimester: Secondary | ICD-10-CM

## 2015-06-25 DIAGNOSIS — Z3A16 16 weeks gestation of pregnancy: Secondary | ICD-10-CM

## 2015-06-25 DIAGNOSIS — N8320 Unspecified ovarian cysts: Secondary | ICD-10-CM

## 2015-06-25 DIAGNOSIS — Z3482 Encounter for supervision of other normal pregnancy, second trimester: Secondary | ICD-10-CM

## 2015-06-25 NOTE — Progress Notes (Signed)
Loose stools for 2 weeks

## 2015-06-26 ENCOUNTER — Encounter: Payer: Self-pay | Admitting: Obstetrics & Gynecology

## 2015-06-27 ENCOUNTER — Encounter: Payer: Self-pay | Admitting: Advanced Practice Midwife

## 2015-06-27 NOTE — Progress Notes (Signed)
Subjective:  Vanessa Larson is a 34 y.o. [id number] at [redacted]w[redacted]d being seen today for ongoing prenatal care.  Patient reports Loose stools, 2x/d, states has had issues with this prior to pregnancy.  Contractions: Not present.  Vag. Bleeding: None. Movement: Absent. Denies leaking of fluid.   Has [place] scheduled today for followup of ovarian cyst.  The following portions of the patient's history were reviewed and updated as appropriate: allergies, current medications, past family history, past medical history, past social history, past surgical history and problem list.   Objective:   Filed Vitals:   06/25/15 0913  BP: 113/60  Pulse: 110  Weight: 158 lb (71.668 kg)    Fetal Status: Fetal Heart Rate (bpm): 156 Fundal Height: 16 cm Movement: Absent     General:  Alert, oriented and cooperative. Patient is in no acute distress.  Skin: Skin is warm and dry. No rash noted.   Cardiovascular: Normal heart rate noted  Respiratory: Normal respiratory effort, no problems with respiration noted  Abdomen: Soft, gravid, appropriate for gestational age. Pain/Pressure: Present     Pelvic: Vag. Bleeding: None Vag D/C Character: Thin   Cervical exam deferred        Extremities: Normal range of motion.  Edema: None  Mental Status: Normal mood and affect. Normal behavior. Normal judgment and thought content.   Urinalysis: Urine Protein: Negative Urine Glucose: Negative  Assessment and Plan:  Pregnancy: [id number] at [redacted]w[redacted]d  1. Supervision of normal pregnancy, second trimester      Doing well, having some IBS stools, not clinically diarrhea, will watch      [place] today to followup on ovarian cyst - [place] MFM OB COMP + 14 WK; Future  Preterm labor symptoms and general obstetric precautions including but not limited to vaginal bleeding, contractions, leaking of fluid and fetal movement were reviewed in detail with the patient. Please refer to After Visit Summary for other counseling recommendations.  Return to office  in one month  Aviva Signs, CNM

## 2015-06-27 NOTE — Patient Instructions (Signed)
Second Trimester of Pregnancy The second trimester is from week 13 through week 28, months 4 through 6. The second trimester is often a time when you feel your best. Your body has also adjusted to being pregnant, and you begin to feel better physically. Usually, morning sickness has lessened or quit completely, you may have more energy, and you may have an increase in appetite. The second trimester is also a time when the fetus is growing rapidly. At the end of the sixth month, the fetus is about 9 inches long and weighs about 1 pounds. You will likely begin to feel the baby move (quickening) between 18 and 20 weeks of the pregnancy. BODY CHANGES Your body goes through many changes during pregnancy. The changes vary from woman to woman.   Your weight will continue to increase. You will notice your lower abdomen bulging out.  You may begin to get stretch marks on your hips, abdomen, and breasts.  You may develop headaches that can be relieved by medicines approved by your health care provider.  You may urinate more often because the fetus is pressing on your bladder.  You may develop or continue to have heartburn as a result of your pregnancy.  You may develop constipation because certain hormones are causing the muscles that push waste through your intestines to slow down.  You may develop hemorrhoids or swollen, bulging veins (varicose veins).  You may have back pain because of the weight gain and pregnancy hormones relaxing your joints between the bones in your pelvis and as a result of a shift in weight and the muscles that support your balance.  Your breasts will continue to grow and be tender.  Your gums may bleed and may be sensitive to brushing and flossing.  Dark spots or blotches (chloasma, mask of pregnancy) may develop on your face. This will likely fade after the baby is born.  A dark line from your belly button to the pubic area (linea nigra) may appear. This will likely fade  after the baby is born.  You may have changes in your hair. These can include thickening of your hair, rapid growth, and changes in texture. Some women also have hair loss during or after pregnancy, or hair that feels dry or thin. Your hair will most likely return to normal after your baby is born. WHAT TO EXPECT AT YOUR PRENATAL VISITS During a routine prenatal visit:  You will be weighed to make sure you and the fetus are growing normally.  Your blood pressure will be taken.  Your abdomen will be measured to track your baby's growth.  The fetal heartbeat will be listened to.  Any test results from the previous visit will be discussed. Your health care provider may ask you:  How you are feeling.  If you are feeling the baby move.  If you have had any abnormal symptoms, such as leaking fluid, bleeding, severe headaches, or abdominal cramping.  If you have any questions. Other tests that may be performed during your second trimester include:  Blood tests that check for:  Low iron levels (anemia).  Gestational diabetes (between 24 and 28 weeks).  Rh antibodies.  Urine tests to check for infections, diabetes, or protein in the urine.  An ultrasound to confirm the proper growth and development of the baby.  An amniocentesis to check for possible genetic problems.  Fetal screens for spina bifida and Down syndrome. HOME CARE INSTRUCTIONS   Avoid all smoking, herbs, alcohol, and unprescribed   drugs. These chemicals affect the formation and growth of the baby.  Follow your health care provider's instructions regarding medicine use. There are medicines that are either safe or unsafe to take during pregnancy.  Exercise only as directed by your health care provider. Experiencing uterine cramps is a good sign to stop exercising.  Continue to eat regular, healthy meals.  Wear a good support bra for breast tenderness.  Do not use hot tubs, steam rooms, or saunas.  Wear your  seat belt at all times when driving.  Avoid raw meat, uncooked cheese, cat litter boxes, and soil used by cats. These carry germs that can cause birth defects in the baby.  Take your prenatal vitamins.  Try taking a stool softener (if your health care provider approves) if you develop constipation. Eat more high-fiber foods, such as fresh vegetables or fruit and whole grains. Drink plenty of fluids to keep your urine clear or pale yellow.  Take warm sitz baths to soothe any pain or discomfort caused by hemorrhoids. Use hemorrhoid cream if your health care provider approves.  If you develop varicose veins, wear support hose. Elevate your feet for 15 minutes, 3-4 times a day. Limit salt in your diet.  Avoid heavy lifting, wear low heel shoes, and practice good posture.  Rest with your legs elevated if you have leg cramps or low back pain.  Visit your dentist if you have not gone yet during your pregnancy. Use a soft toothbrush to brush your teeth and be gentle when you floss.  A sexual relationship may be continued unless your health care provider directs you otherwise.  Continue to go to all your prenatal visits as directed by your health care provider. SEEK MEDICAL CARE IF:   You have dizziness.  You have mild pelvic cramps, pelvic pressure, or nagging pain in the abdominal area.  You have persistent nausea, vomiting, or diarrhea.  You have a bad smelling vaginal discharge.  You have pain with urination. SEEK IMMEDIATE MEDICAL CARE IF:   You have a fever.  You are leaking fluid from your vagina.  You have spotting or bleeding from your vagina.  You have severe abdominal cramping or pain.  You have rapid weight gain or loss.  You have shortness of breath with chest pain.  You notice sudden or extreme swelling of your face, hands, ankles, feet, or legs.  You have not felt your baby move in over an hour.  You have severe headaches that do not go away with  medicine.  You have vision changes. Document Released: 10/10/2001 Document Revised: 10/21/2013 Document Reviewed: 12/17/2012 ExitCare Patient Information 2015 [employer]. This information is not intended to replace advice given to you by your health care provider. Make sure you discuss any questions you have with your health care provider.  

## 2015-07-09 ENCOUNTER — Other Ambulatory Visit (INDEPENDENT_AMBULATORY_CARE_PROVIDER_SITE_OTHER): Payer: BLUE CROSS/BLUE SHIELD

## 2015-07-09 DIAGNOSIS — Z3482 Encounter for supervision of other normal pregnancy, second trimester: Secondary | ICD-10-CM | POA: Diagnosis not present

## 2015-07-09 NOTE — Progress Notes (Signed)
Pt here for bedside U/S to check FHT and fetal movement per Dr Penne Lash.  FHT 135 BPM and very active in utero.  Pt is scheduled for anatomy scan next week and will return for ROB in 2 weeks.

## 2015-07-16 ENCOUNTER — Other Ambulatory Visit: Payer: Self-pay | Admitting: Advanced Practice Midwife

## 2015-07-16 ENCOUNTER — Ambulatory Visit (HOSPITAL_COMMUNITY)
Admission: RE | Admit: 2015-07-16 | Discharge: 2015-07-16 | Disposition: A | Discharge status: Home / Self Care | Payer: BLUE CROSS/BLUE SHIELD | Source: Ambulatory Visit | Attending: Advanced Practice Midwife | Admitting: Advanced Practice Midwife

## 2015-07-16 DIAGNOSIS — Z3492 Encounter for supervision of normal pregnancy, unspecified, second trimester: Secondary | ICD-10-CM | POA: Diagnosis present

## 2015-07-16 DIAGNOSIS — Z3689 Encounter for other specified antenatal screening: Secondary | ICD-10-CM

## 2015-07-16 DIAGNOSIS — Z3491 Encounter for supervision of normal pregnancy, unspecified, first trimester: Secondary | ICD-10-CM

## 2015-07-16 DIAGNOSIS — O3421 Maternal care for scar from previous cesarean delivery: Secondary | ICD-10-CM | POA: Insufficient documentation

## 2015-07-16 DIAGNOSIS — O34219 Maternal care for unspecified type scar from previous cesarean delivery: Secondary | ICD-10-CM

## 2015-07-16 DIAGNOSIS — Z3A19 19 weeks gestation of pregnancy: Secondary | ICD-10-CM

## 2015-07-23 ENCOUNTER — Encounter: Payer: Self-pay | Admitting: Obstetrics and Gynecology

## 2015-07-23 ENCOUNTER — Ambulatory Visit (INDEPENDENT_AMBULATORY_CARE_PROVIDER_SITE_OTHER): Payer: BLUE CROSS/BLUE SHIELD | Admitting: Obstetrics and Gynecology

## 2015-07-23 VITALS — BP 106/63 | HR 86 | Wt 165.0 lb

## 2015-07-23 DIAGNOSIS — Z3491 Encounter for supervision of normal pregnancy, unspecified, first trimester: Secondary | ICD-10-CM

## 2015-07-23 DIAGNOSIS — Z3481 Encounter for supervision of other normal pregnancy, first trimester: Secondary | ICD-10-CM

## 2015-07-23 NOTE — Progress Notes (Signed)
Subjective:  Vanessa Larson is a 34 y.o. Z6X0960 at [redacted]w[redacted]d being seen today for ongoing prenatal care.  Patient reports no complaints. Feels something in vagina that she thinks may be uterus prolapsing. No dysparunia.  Loose stools resolved. Undecided re contraception (possibly vasectomy). Reviewed [place] and F/U anatomy scheduled. Contractions: Not present.  Vag. Bleeding: None. Movement: Present. Denies leaking of fluid.   The following portions of the patient's history were reviewed and updated as appropriate: allergies, current medications, past family history, past medical history, past social history, past surgical history and problem list.   Objective:   Filed Vitals:   07/23/15 0907  BP: 106/63  Pulse: 86  Weight: 165 lb (74.844 kg)    Fetal Status:     Movement: Present     General:  Alert, oriented and cooperative. Patient is in no acute distress.  Skin: Skin is warm and dry. No rash noted.   Cardiovascular: Normal heart rate noted  Respiratory: Normal respiratory effort, no problems with respiration noted  Abdomen: Soft, gravid, appropriate for gestational age. Pain/Pressure: Absent     Pelvic: Vag. Bleeding: None Vag D/C Character: Thin   Cervical exam deferred        Extremities: Normal range of motion.  Edema: None  Mental Status: Normal mood and affect. Normal behavior. Normal judgment and thought content.   Urinalysis: Urine Protein: Negative Urine Glucose: Negative  Assessment and Plan:  Pregnancy: [id number] at [redacted]w[redacted]d  1. Supervision of normal pregnancy in first trimester Doing well  Preterm labor symptoms and general obstetric precautions including but not limited to vaginal bleeding, contractions, leaking of fluid and fetal movement were reviewed in detail with the patient. Please refer to After Visit Summary for other counseling recommendations.  Return in about 1 month (around 08/22/2015). Will assess cx position with next pelvic. Discussed RLP.    Danae Orleans,  CNM

## 2015-07-23 NOTE — Patient Instructions (Addendum)
Second Trimester of Pregnancy °The second trimester is from week 13 through week 28, months 4 through 6. The second trimester is often a time when you feel your best. Your body has also adjusted to being pregnant, and you begin to feel better physically. Usually, morning sickness has lessened or quit completely, you may have more energy, and you may have an increase in appetite. The second trimester is also a time when the fetus is growing rapidly. At the end of the sixth month, the fetus is about 9 inches long and weighs about 1½ pounds. You will likely begin to feel the baby move (quickening) between 18 and 20 weeks of the pregnancy. °BODY CHANGES °Your body goes through many changes during pregnancy. The changes vary from woman to woman.  °· Your weight will continue to increase. You will notice your lower abdomen bulging out. °· You may begin to get stretch marks on your hips, abdomen, and breasts. °· You may develop headaches that can be relieved by medicines approved by your health care Ellionna Buckbee. °· You may urinate more often because the fetus is pressing on your bladder. °· You may develop or continue to have heartburn as a result of your pregnancy. °· You may develop constipation because certain hormones are causing the muscles that push waste through your intestines to slow down. °· You may develop hemorrhoids or swollen, bulging veins (varicose veins). °· You may have back pain because of the weight gain and pregnancy hormones relaxing your joints between the bones in your pelvis and as a result of a shift in weight and the muscles that support your balance. °· Your breasts will continue to grow and be tender. °· Your gums may bleed and may be sensitive to brushing and flossing. °· Dark spots or blotches (chloasma, mask of pregnancy) may develop on your face. This will likely fade after the baby is born. °· A dark line from your belly button to the pubic area (linea nigra) may appear. This will likely fade  after the baby is born. °· You may have changes in your hair. These can include thickening of your hair, rapid growth, and changes in texture. Some women also have hair loss during or after pregnancy, or hair that feels dry or thin. Your hair will most likely return to normal after your baby is born. °WHAT TO EXPECT AT YOUR PRENATAL VISITS °During a routine prenatal visit: °· You will be weighed to make sure you and the fetus are growing normally. °· Your blood pressure will be taken. °· Your abdomen will be measured to track your baby's growth. °· The fetal heartbeat will be listened to. °· Any test results from the previous visit will be discussed. °Your health care Le Ferraz may ask you: °· How you are feeling. °· If you are feeling the baby move. °· If you have had any abnormal symptoms, such as leaking fluid, bleeding, severe headaches, or abdominal cramping. °· If you have any questions. °Other tests that may be performed during your second trimester include: °· Blood tests that check for: °¨ Low iron levels (anemia). °¨ Gestational diabetes (between 24 and 28 weeks). °¨ Rh antibodies. °· Urine tests to check for infections, diabetes, or protein in the urine. °· An ultrasound to confirm the proper growth and development of the baby. °· An amniocentesis to check for possible genetic problems. °· Fetal screens for spina bifida and Down syndrome. °HOME CARE INSTRUCTIONS  °· Avoid all smoking, herbs, alcohol, and unprescribed   drugs. These chemicals affect the formation and growth of the baby. °· Follow your health care Pang Robers's instructions regarding medicine use. There are medicines that are either safe or unsafe to take during pregnancy. °· Exercise only as directed by your health care Madalee Altmann. Experiencing uterine cramps is a good sign to stop exercising. °· Continue to eat regular, healthy meals. °· Wear a good support bra for breast tenderness. °· Do not use hot tubs, steam rooms, or saunas. °· Wear your  seat belt at all times when driving. °· Avoid raw meat, uncooked cheese, cat litter boxes, and soil used by cats. These carry germs that can cause birth defects in the baby. °· Take your prenatal vitamins. °· Try taking a stool softener (if your health care Piedad Standiford approves) if you develop constipation. Eat more high-fiber foods, such as fresh vegetables or fruit and whole grains. Drink plenty of fluids to keep your urine clear or pale yellow. °· Take warm sitz baths to soothe any pain or discomfort caused by hemorrhoids. Use hemorrhoid cream if your health care Milan Perkins approves. °· If you develop varicose veins, wear support hose. Elevate your feet for 15 minutes, 3-4 times a day. Limit salt in your diet. °· Avoid heavy lifting, wear low heel shoes, and practice good posture. °· Rest with your legs elevated if you have leg cramps or low back pain. °· Visit your dentist if you have not gone yet during your pregnancy. Use a soft toothbrush to brush your teeth and be gentle when you floss. °· A sexual relationship may be continued unless your health care Rudy Luhmann directs you otherwise. °· Continue to go to all your prenatal visits as directed by your health care Noble Cicalese. °SEEK MEDICAL CARE IF:  °· You have dizziness. °· You have mild pelvic cramps, pelvic pressure, or nagging pain in the abdominal area. °· You have persistent nausea, vomiting, or diarrhea. °· You have a bad smelling vaginal discharge. °· You have pain with urination. °SEEK IMMEDIATE MEDICAL CARE IF:  °· You have a fever. °· You are leaking fluid from your vagina. °· You have spotting or bleeding from your vagina. °· You have severe abdominal cramping or pain. °· You have rapid weight gain or loss. °· You have shortness of breath with chest pain. °· You notice sudden or extreme swelling of your face, hands, ankles, feet, or legs. °· You have not felt your baby move in over an hour. °· You have severe headaches that do not go away with  medicine. °· You have vision changes. °Document Released: 10/10/2001 Document Revised: 10/21/2013 Document Reviewed: 12/17/2012 °ExitCare® Patient Information ©2015 [employer]. This information is not intended to replace advice given to you by your health care Bellamy Judson. Make sure you discuss any questions you have with your health care Shantoria Ellwood. ° °Round Ligament Pain During Pregnancy °Round ligament pain is a sharp pain or jabbing feeling often felt in the lower belly or groin area on one or both sides. It is one of the most common complaints during pregnancy and is considered a normal part of pregnancy. It is most often felt during the second trimester. ° °Here is what you need to know about round ligament pain, including some tips to help you feel better. ° °Causes of Round Ligament Pain ° °Several thick ligaments surround and support your womb (uterus) as it grows during pregnancy. One of them is called the round ligament. ° °The round ligament connects the front part of the womb to   your groin, the area where your legs attach to your pelvis. The round ligament normally tightens and relaxes slowly. ° °As your baby and womb grow, the round ligament stretches. That makes it more likely to become strained. ° °Sudden movements can cause the ligament to tighten quickly, like a rubber band snapping. This causes a sudden and quick jabbing feeling. ° °Symptoms of Round Ligament Pain ° °Round ligament pain can be concerning and uncomfortable. But it is considered normal as your body changes during pregnancy. ° °The symptoms of round ligament pain include a sharp, sudden spasm in the belly. It usually affects the right side, but it may happen on both sides. The pain only lasts a few seconds. ° °Exercise may cause the pain, as will rapid movements such as: ° °sneezing °coughing °laughing °rolling over in bed °standing up too quickly ° °Treatment of Round Ligament Pain ° °Here are some tips that may help reduce your  discomfort: ° °Pain relief. Take over-the-counter acetaminophen for pain, if necessary. Ask your doctor if this is OK. ° °Exercise. Get plenty of exercise to keep your stomach (core) muscles strong. Doing stretching exercises or prenatal yoga can be helpful. Ask your doctor which exercises are safe for you and your baby. ° °A helpful exercise involves putting your hands and knees on the floor, lowering your head, and pushing your backside into the air. ° °Avoid sudden movements. Change positions slowly (such as standing up or sitting down) to avoid sudden movements that may cause stretching and pain. ° °Flex your hips. Bend and flex your hips before you cough, sneeze, or laugh to avoid pulling on the ligaments. ° °Apply warmth. A heating pad or warm bath may be helpful. Ask your doctor if this is OK. Extreme heat can be dangerous to the baby. ° °You should try to modify your daily activity level and avoid positions that may worsen the condition. ° °When to Call the Doctor/Midwife ° °Always tell your doctor or midwife about any type of pain you have during pregnancy. Round ligament pain is quick and doesn't last long. ° °Call your health care Lux Meaders immediately if you have: ° °severe pain °fever °chills °pain on urination °difficulty walking ° °Belly pain during pregnancy can be due to many different causes. It is important for your doctor to rule out more serious conditions, including pregnancy complications such as placenta abruption or non-pregnancy illnesses such as: ° °inguinal hernia °appendicitis °stomach, liver, and kidney problems °Preterm labor pains may sometimes be mistaken for round ligament pain. ° °

## 2015-08-13 ENCOUNTER — Ambulatory Visit (HOSPITAL_COMMUNITY)
Admission: RE | Admit: 2015-08-13 | Discharge: 2015-08-13 | Disposition: A | Discharge status: Home / Self Care | Payer: BLUE CROSS/BLUE SHIELD | Source: Ambulatory Visit | Attending: Obstetrics and Gynecology | Admitting: Obstetrics and Gynecology

## 2015-08-13 ENCOUNTER — Other Ambulatory Visit: Payer: Self-pay | Admitting: Obstetrics and Gynecology

## 2015-08-13 DIAGNOSIS — Z3A23 23 weeks gestation of pregnancy: Secondary | ICD-10-CM | POA: Insufficient documentation

## 2015-08-13 DIAGNOSIS — Z3491 Encounter for supervision of normal pregnancy, unspecified, first trimester: Secondary | ICD-10-CM

## 2015-08-13 DIAGNOSIS — Z3482 Encounter for supervision of other normal pregnancy, second trimester: Secondary | ICD-10-CM | POA: Diagnosis present

## 2015-08-20 ENCOUNTER — Ambulatory Visit (INDEPENDENT_AMBULATORY_CARE_PROVIDER_SITE_OTHER): Payer: BLUE CROSS/BLUE SHIELD | Admitting: Family

## 2015-08-20 VITALS — BP 96/60 | HR 82 | Wt 169.0 lb

## 2015-08-20 DIAGNOSIS — Z3491 Encounter for supervision of normal pregnancy, unspecified, first trimester: Secondary | ICD-10-CM

## 2015-08-20 DIAGNOSIS — Z3482 Encounter for supervision of other normal pregnancy, second trimester: Secondary | ICD-10-CM

## 2015-08-20 NOTE — Progress Notes (Signed)
Subjective:  Vanessa Larson is a 34 y.o. [id number] at 8177w3d being seen today for ongoing prenatal care.  Patient reports occasional contractions.  Contractions: Irregular.  Vag. Bleeding: None. Movement: Present. Denies leaking of fluid.   The following portions of the patient's history were reviewed and updated as appropriate: allergies, current medications, past family history, past medical history, past social history, past surgical history and problem list. Problem list updated.  Objective:   Filed Vitals:   08/20/15 0901  BP: 96/60  Pulse: 82  Weight: 169 lb (76.658 kg)    Fetal Status: Fetal Heart Rate (bpm): 152 Fundal Height: 25 cm Movement: Present     General:  Alert, oriented and cooperative. Patient is in no acute distress.  Skin: Skin is warm and dry. No rash noted.   Cardiovascular: Normal heart rate noted  Respiratory: Normal respiratory effort, no problems with respiration noted  Abdomen: Soft, gravid, appropriate for gestational age. Pain/Pressure: Present     Pelvic: Vag. Bleeding: None Vag D/C Character: Thin   Cervical exam deferred        Extremities: Normal range of motion.  Edema: None  Mental Status: Normal mood and affect. Normal behavior. Normal judgment and thought content.   Urinalysis: Urine Protein: Negative Urine Glucose: Negative  Assessment and Plan:  Pregnancy: [id number] at 1277w3d  1. Supervision of normal pregnancy in first trimester - Reviewed ultrasound results - Discussed increased Deberah PeltonBraxton Hicks with subsequent pregnancies - reviewed cervical length 4cm on last ultrasound.   - Reviewed what to come in for.    Preterm labor symptoms and general obstetric precautions including but not limited to vaginal bleeding, contractions, leaking of fluid and fetal movement were reviewed in detail with the patient. Please refer to After Visit Summary for other counseling recommendations.  Return in about 4 weeks (around 09/17/2015).   Eino FarberWalidah Kennith GainN Karim,  CNM

## 2015-08-22 ENCOUNTER — Encounter (HOSPITAL_COMMUNITY): Payer: Self-pay

## 2015-08-29 ENCOUNTER — Inpatient Hospital Stay (HOSPITAL_COMMUNITY)
Admission: AD | Admit: 2015-08-29 | Discharge: 2015-08-29 | Disposition: A | Discharge status: Home / Self Care | Payer: BLUE CROSS/BLUE SHIELD | Source: Ambulatory Visit | Attending: Family Medicine | Admitting: Family Medicine

## 2015-08-29 ENCOUNTER — Encounter (HOSPITAL_COMMUNITY): Payer: Self-pay | Admitting: *Deleted

## 2015-08-29 DIAGNOSIS — Z3A25 25 weeks gestation of pregnancy: Secondary | ICD-10-CM | POA: Insufficient documentation

## 2015-08-29 DIAGNOSIS — R109 Unspecified abdominal pain: Secondary | ICD-10-CM | POA: Insufficient documentation

## 2015-08-29 DIAGNOSIS — O368120 Decreased fetal movements, second trimester, not applicable or unspecified: Secondary | ICD-10-CM | POA: Diagnosis not present

## 2015-08-29 LAB — URINALYSIS, ROUTINE W REFLEX MICROSCOPIC
BILIRUBIN URINE: NEGATIVE
Glucose, UA: NEGATIVE mg/dL
Hgb urine dipstick: NEGATIVE
KETONES UR: NEGATIVE mg/dL
LEUKOCYTES UA: NEGATIVE
NITRITE: NEGATIVE
PROTEIN: NEGATIVE mg/dL
Specific Gravity, Urine: 1.02 (ref 1.005–1.030)
UROBILINOGEN UA: 0.2 mg/dL (ref 0.0–1.0)
pH: 6 (ref 5.0–8.0)

## 2015-08-29 NOTE — MAU Provider Note (Signed)
  History   [id number]  CSN: [id number]  Arrival date and time: 08/29/15 0915   First Provider Initiated Contact with Patient 08/29/15 1027      Chief Complaint  Patient presents with  . Abdominal Pain  . Decreased Fetal Movement   HPI  OB History    Gravida Para Term Preterm AB TAB SAB Ectopic Multiple Living   5 3 3  1  1  1 4       Past Medical History  Diagnosis Date  . Anxiety   . Depression   . Pregnancy related condition in third trimester 2010    [redacted] weeks gestation complicated by depression/anxiety  . H/O abnormal Pap smear   . Ovarian cyst affecting pregnancy in first trimester, antepartum 05/28/2015    Past Surgical History  Procedure Laterality Date  . Cesarean section  2010    x 1  . Wisdom tooth extraction    . Dilation and evacuation N/A 12/06/2014    Procedure: DILATATION AND EVACUATION;  Surgeon: Reva Boresanya S Pratt, MD;  Location: [facility];  Service: Gynecology;  Laterality: N/A;    Family History  Problem Relation Age of Onset  . Depression Mother   . Stroke Maternal Grandmother   . Cancer Maternal Grandfather     Social History  Substance Use Topics  . Smoking status: Never Smoker   . Smokeless tobacco: Never Used  . Alcohol Use: No    Allergies: No Known Allergies  Prescriptions prior to admission  Medication Sig Dispense Refill Last Dose  . ibuprofen (ADVIL,MOTRIN) 200 MG tablet Take 600 mg by mouth every 6 (six) hours as needed for moderate pain.   08/28/2015 at Unknown time  . Prenatal Multivit-Min-Fe-FA (PRENATAL VITAMINS PO) Take 1 tablet by mouth daily.    Past Week at Unknown time  . buPROPion (WELLBUTRIN XL) 300 MG 24 hr tablet TAKE 1 TABLET (300 MG TOTAL) BY MOUTH DAILY. (Patient not taking: Reported on 08/29/2015) 90 tablet 0 Not Taking at Unknown time    Review of Systems  Constitutional: Negative.   HENT: Negative.   Eyes: Negative.   Respiratory: Negative.   Cardiovascular: Negative.   Gastrointestinal: Positive for abdominal  pain.  Genitourinary: Negative.   Musculoskeletal: Negative.   Skin: Negative.   Neurological: Negative.   Endo/Heme/Allergies: Negative.   Psychiatric/Behavioral: Negative.    Physical Exam   Blood pressure 104/60, pulse 89, temperature 98.4 F (36.9 C), temperature source Oral, resp. rate 18, height 5\' 6"  (1.676 m), weight 170 lb (77.111 kg), last menstrual period 01/17/2015, unknown if currently breastfeeding.  Physical Exam  Constitutional: She is oriented to person, place, and time. She appears well-developed and well-nourished.  HENT:  Head: Normocephalic.  Eyes: Pupils are equal, round, and reactive to light.  Neck: Normal range of motion.  Cardiovascular: Normal rate, regular rhythm, normal heart sounds and intact distal pulses.   Respiratory: Effort normal and breath sounds normal.  GI: Soft. Bowel sounds are normal.  Genitourinary: Vagina normal and uterus normal.  Musculoskeletal: Normal range of motion.  Neurological: She is alert and oriented to person, place, and time. She has normal reflexes.  Skin: Skin is warm and dry.  Psychiatric: She has a normal mood and affect. Her behavior is normal. Judgment and thought content normal.    MAU Course  Procedures  MDM Common discomfort of pregnancy  Assessment and Plan  Common discomfort of pregnancy. D/C home  Vanessa Larson, MARIE DARLENE 08/29/2015, 10:31 AM

## 2015-08-29 NOTE — MAU Note (Addendum)
States she woke up twice during the night with a pressure and "balled up" sensation at base of "xiphoid process."States she had a BM during this time and pain did not subside States the pain has subsided now, but was worried. Also states the baby has not been moving as much. Patient admits to feeling FM at this time.

## 2015-08-29 NOTE — Discharge Instructions (Signed)
Keep your scheduled appointment for prenatal care. Call the office or provider on call with further concerns or return to MAU as needed. °

## 2015-09-13 ENCOUNTER — Ambulatory Visit (INDEPENDENT_AMBULATORY_CARE_PROVIDER_SITE_OTHER): Payer: BLUE CROSS/BLUE SHIELD | Admitting: Advanced Practice Midwife

## 2015-09-13 VITALS — BP 104/68 | HR 81 | Wt 174.0 lb

## 2015-09-13 DIAGNOSIS — Z23 Encounter for immunization: Secondary | ICD-10-CM | POA: Diagnosis not present

## 2015-09-13 DIAGNOSIS — R110 Nausea: Secondary | ICD-10-CM

## 2015-09-13 DIAGNOSIS — Z3491 Encounter for supervision of normal pregnancy, unspecified, first trimester: Secondary | ICD-10-CM

## 2015-09-13 DIAGNOSIS — Z3481 Encounter for supervision of other normal pregnancy, first trimester: Secondary | ICD-10-CM

## 2015-09-13 DIAGNOSIS — R1011 Right upper quadrant pain: Secondary | ICD-10-CM

## 2015-09-13 NOTE — Progress Notes (Signed)
28 wk labs today 

## 2015-09-13 NOTE — Progress Notes (Signed)
Subjective:  Vanessa HartshornMolly M Yielding is a 34 y.o. [id number] at 3546w6d being seen today for ongoing prenatal care.  Patient reports occasional contractions and two episodes of severe RUQ pain w/ nausea over the past few weeks. Was seen in [facility] for the first episode and no testing was done. Denies fever, chills, vomiting, diarrhea constipation. No Hx Gall bladder problems.  Contractions: Irritability.  Vag. Bleeding: None. Movement: Present. Denies leaking of fluid.   The following portions of the patient's history were reviewed and updated as appropriate: allergies, current medications, past family history, past medical history, past social history, past surgical history and problem list. Problem list updated.  Objective:   Filed Vitals:   09/13/15 0918  BP: 104/68  Pulse: 81  Weight: 174 lb (78.926 kg)    Fetal Status: Fetal Heart Rate (bpm): 138 Fundal Height: 28 cm Movement: Present  Presentation: Vertex  General:  Alert, oriented and cooperative. Patient is in no acute distress.  Skin: Skin is warm and dry. No rash noted.   Cardiovascular: Normal heart rate noted  Respiratory: Normal respiratory effort, no problems with respiration noted  Abdomen: Soft, gravid, appropriate for gestational age. Pain/Pressure: Absent     Pelvic: Vag. Bleeding: None Vag D/C Character: Thin   Cervical exam deferred        Extremities: Normal range of motion.  Edema: None  Mental Status: Normal mood and affect. Normal behavior. Normal judgment and thought content.   Urinalysis: Urine Protein: Negative Urine Glucose: Negative  Assessment and Plan:  Pregnancy: [id number] at 7646w6d  1. Supervision of normal pregnancy in first trimester  - Tdap vaccine greater than or equal to 7yo IM - Glucose Tolerance, 1 HR (50g) - CBC - HIV antibody - RPR  2. RUQ abdominal pain  - US Abdomen Complete; Future - Amylase - Lipase - Comprehensive metabolic panel  3. Nausea  - US Abdomen Complete; Future - Amylase -  Lipase - Comprehensive metabolic panel   Preterm labor symptoms and general obstetric precautions including but not limited to vaginal bleeding, contractions, leaking of fluid and fetal movement were reviewed in detail with the patient. Please refer to After Visit Summary for other counseling recommendations.  F/U 2 weeks  Dorathy KinsmanVirginia Rolena Knutson, [place]

## 2015-09-13 NOTE — Patient Instructions (Addendum)
Preterm Labor Information Preterm labor is when labor starts at less than 37 weeks of pregnancy. The normal length of a pregnancy is 39 to 41 weeks. CAUSES Often, there is no identifiable underlying cause as to why a woman goes into preterm labor. One of the most common known causes of preterm labor is infection. Infections of the uterus, cervix, vagina, amniotic sac, bladder, kidney, or even the lungs (pneumonia) can cause labor to start. Other suspected causes of preterm labor include:   Urogenital infections, such as yeast infections and bacterial vaginosis.   Uterine abnormalities (uterine shape, uterine septum, fibroids, or bleeding from the placenta).   A cervix that has been operated on (it may fail to stay closed).   Malformations in the fetus.   Multiple gestations (twins, triplets, and so on).   Breakage of the amniotic sac.  RISK FACTORS  Having a previous history of preterm labor.   Having premature rupture of membranes (PROM).   Having a placenta that covers the opening of the cervix (placenta previa).   Having a placenta that separates from the uterus (placental abruption).   Having a cervix that is too weak to hold the fetus in the uterus (incompetent cervix).   Having too much fluid in the amniotic sac (polyhydramnios).   Taking illegal drugs or smoking while pregnant.   Not gaining enough weight while pregnant.   Being younger than 18 and older than 33 years old.   Having a low socioeconomic status.   Being African American. SYMPTOMS Signs and symptoms of preterm labor include:   Menstrual-like cramps, abdominal pain, or back pain.  Uterine contractions that are regular, as frequent as six in an hour, regardless of their intensity (may be mild or painful).  Contractions that start on the top of the uterus and spread down to the lower abdomen and back.   A sense of increased pelvic pressure.   A watery or bloody mucus discharge that  comes from the vagina.  TREATMENT Depending on the length of the pregnancy and other circumstances, your health care provider may suggest bed rest. If necessary, there are medicines that can be given to stop contractions and to mature the fetal lungs. If labor happens before 34 weeks of pregnancy, a prolonged hospital stay may be recommended. Treatment depends on the condition of both you and the fetus.  WHAT SHOULD YOU DO IF YOU THINK YOU ARE IN PRETERM LABOR? Call your health care provider right away. You will need to go to the hospital to get checked immediately. HOW CAN YOU PREVENT PRETERM LABOR IN FUTURE PREGNANCIES? You should:   Stop smoking if you smoke.  Maintain healthy weight gain and avoid chemicals and drugs that are not necessary.  Be watchful for any type of infection.  Inform your health care provider if you have a known history of preterm labor.   This information is not intended to replace advice given to you by your health care provider. Make sure you discuss any questions you have with your health care provider.   Document Released: 01/06/2004 Document Revised: 06/18/2013 Document Reviewed: 11/18/2012 Elsevier Interactive Patient Education 2016 Elsevier Inc.  Tdap Vaccine (Tetanus, Diphtheria and Pertussis): What You Need to Know 1. Why get vaccinated? Tetanus, diphtheria and pertussis are very serious diseases. Tdap vaccine can protect us from these diseases. And, Tdap vaccine given to pregnant women can protect newborn babies against pertussis. TETANUS (Lockjaw) is rare in the [place] today. It causes painful muscle tightening and   stiffness, usually all over the body.  It can lead to tightening of muscles in the head and neck so you can't open your mouth, swallow, or sometimes even breathe. Tetanus kills about 1 out of 10 people who are infected even after receiving the best medical care. DIPHTHERIA is also rare in the [place] today. It can cause a  thick coating to form in the back of the throat.  It can lead to breathing problems, heart failure, paralysis, and death. PERTUSSIS (Whooping Cough) causes severe coughing spells, which can cause difficulty breathing, vomiting and disturbed sleep.  It can also lead to weight loss, incontinence, and rib fractures. Up to 2 in 100 adolescents and 5 in 100 adults with pertussis are hospitalized or have complications, which could include pneumonia or death. These diseases are caused by bacteria. Diphtheria and pertussis are spread from person to person through secretions from coughing or sneezing. Tetanus enters the body through cuts, scratches, or wounds. Before vaccines, as many as 200,000 cases of diphtheria, 200,000 cases of pertussis, and hundreds of cases of tetanus, were reported in the [place] each year. Since vaccination began, reports of cases for tetanus and diphtheria have dropped by about 99% and for pertussis by about 80%. 2. Tdap vaccine Tdap vaccine can protect adolescents and adults from tetanus, diphtheria, and pertussis. One dose of Tdap is routinely given at age 11 or 12. People who did not get Tdap at that age should get it as soon as possible. Tdap is especially important for healthcare professionals and anyone having close contact with a baby younger than 12 months. Pregnant women should get a dose of Tdap during every pregnancy, to protect the newborn from pertussis. Infants are most at risk for severe, life-threatening complications from pertussis. Another vaccine, called Td, protects against tetanus and diphtheria, but not pertussis. A Td booster should be given every 10 years. Tdap may be given as one of these boosters if you have never gotten Tdap before. Tdap may also be given after a severe cut or burn to prevent tetanus infection. Your doctor or the person giving you the vaccine can give you more information. Tdap may safely be given at the same time as other  vaccines. 3. Some people should not get this vaccine  A person who has ever had a life-threatening allergic reaction after a previous dose of any diphtheria, tetanus or pertussis containing vaccine, OR has a severe allergy to any part of this vaccine, should not get Tdap vaccine. Tell the person giving the vaccine about any severe allergies.  Anyone who had coma or long repeated seizures within 7 days after a childhood dose of DTP or DTaP, or a previous dose of Tdap, should not get Tdap, unless a cause other than the vaccine was found. They can still get Td.  Talk to your doctor if you:  have seizures or another nervous system problem,  had severe pain or swelling after any vaccine containing diphtheria, tetanus or pertussis,  ever had a condition called Guillain-Barr Syndrome (GBS),  aren't feeling well on the day the shot is scheduled. 4. Risks With any medicine, including vaccines, there is a chance of side effects. These are usually mild and go away on their own. Serious reactions are also possible but are rare. Most people who get Tdap vaccine do not have any problems with it. Mild problems following Tdap (Did not interfere with activities)  Pain where the shot was given (about 3 in 4 adolescents or   2 in 3 adults)  Redness or swelling where the shot was given (about 1 person in 5)  Mild fever of at least 100.4F (up to about 1 in 25 adolescents or 1 in 100 adults)  Headache (about 3 or 4 people in 10)  Tiredness (about 1 person in 3 or 4)  Nausea, vomiting, diarrhea, stomach ache (up to 1 in 4 adolescents or 1 in 10 adults)  Chills, sore joints (about 1 person in 10)  Body aches (about 1 person in 3 or 4)  Rash, swollen glands (uncommon) Moderate problems following Tdap (Interfered with activities, but did not require medical attention)  Pain where the shot was given (up to 1 in 5 or 6)  Redness or swelling where the shot was given (up to about 1 in 16 adolescents or  1 in 12 adults)  Fever over 102F (about 1 in 100 adolescents or 1 in 250 adults)  Headache (about 1 in 7 adolescents or 1 in 10 adults)  Nausea, vomiting, diarrhea, stomach ache (up to 1 or 3 people in 100)  Swelling of the entire arm where the shot was given (up to about 1 in 500). Severe problems following Tdap (Unable to perform usual activities; required medical attention)  Swelling, severe pain, bleeding and redness in the arm where the shot was given (rare). Problems that could happen after any vaccine:  People sometimes faint after a medical procedure, including vaccination. Sitting or lying down for about 15 minutes can help prevent fainting, and injuries caused by a fall. Tell your doctor if you feel dizzy, or have vision changes or ringing in the ears.  Some people get severe pain in the shoulder and have difficulty moving the arm where a shot was given. This happens very rarely.  Any medication can cause a severe allergic reaction. Such reactions from a vaccine are very rare, estimated at fewer than 1 in a million doses, and would happen within a few minutes to a few hours after the vaccination. As with any medicine, there is a very remote chance of a vaccine causing a serious injury or death. The safety of vaccines is always being monitored. For more information, visit: www.cdc.gov/vaccinesafety/ 5. What if there is a serious problem? What should I look for?  Look for anything that concerns you, such as signs of a severe allergic reaction, very high fever, or unusual behavior.  Signs of a severe allergic reaction can include hives, swelling of the face and throat, difficulty breathing, a fast heartbeat, dizziness, and weakness. These would usually start a few minutes to a few hours after the vaccination. What should I do?  If you think it is a severe allergic reaction or other emergency that can't wait, call 9-1-1 or get the person to the nearest hospital. Otherwise, call  your doctor.  Afterward, the reaction should be reported to the Vaccine Adverse Event Reporting System (VAERS). Your doctor might file this report, or you can do it yourself through the VAERS web site at www.vaers.hhs.gov, or by calling 1-800-822-7967. VAERS does not give medical advice.  6. The National Vaccine Injury Compensation Program The National Vaccine Injury Compensation Program (VICP) is a federal program that was created to compensate people who may have been injured by certain vaccines. Persons who believe they may have been injured by a vaccine can learn about the program and about filing a claim by calling 1-800-338-2382 or visiting the VICP website at www.hrsa.gov/vaccinecompensation. There is a time limit to file a   claim for compensation. 7. How can I learn more?  Ask your doctor. He or she can give you the vaccine package insert or suggest other sources of information.  Call your local or state health department.  Contact the Centers for Disease Control and Prevention (CDC):  Call 1-800-232-4636 (1-800-CDC-INFO) or  Visit CDC's website at www.cdc.gov/vaccines CDC Tdap Vaccine VIS (12/23/13)   This information is not intended to replace advice given to you by your health care provider. Make sure you discuss any questions you have with your health care provider.   Document Released: 04/16/2012 Document Revised: 11/06/2014 Document Reviewed: 01/28/2014 Elsevier Interactive Patient Education 2016 Elsevier Inc.  

## 2015-09-14 ENCOUNTER — Telehealth: Payer: Self-pay | Admitting: *Deleted

## 2015-09-14 LAB — COMPREHENSIVE METABOLIC PANEL
ALBUMIN: 3.5 g/dL — AB (ref 3.6–5.1)
ALT: 11 U/L (ref 6–29)
AST: 13 U/L (ref 10–30)
Alkaline Phosphatase: 79 U/L (ref 33–115)
BILIRUBIN TOTAL: 0.4 mg/dL (ref 0.2–1.2)
BUN: 6 mg/dL — ABNORMAL LOW (ref 7–25)
CALCIUM: 8.3 mg/dL — AB (ref 8.6–10.2)
CHLORIDE: 105 mmol/L (ref 98–110)
CO2: 21 mmol/L (ref 20–31)
CREATININE: 0.46 mg/dL — AB (ref 0.50–1.10)
Glucose, Bld: 79 mg/dL (ref 65–99)
Potassium: 3.6 mmol/L (ref 3.5–5.3)
SODIUM: 138 mmol/L (ref 135–146)
TOTAL PROTEIN: 6.1 g/dL (ref 6.1–8.1)

## 2015-09-14 LAB — LIPASE: Lipase: 16 U/L (ref 7–60)

## 2015-09-14 LAB — GLUCOSE TOLERANCE, 1 HOUR (50G) W/O FASTING: GLUCOSE 1 HOUR GTT: 76 mg/dL (ref 70–140)

## 2015-09-14 LAB — CBC
HCT: 31 % — ABNORMAL LOW (ref 36.0–46.0)
HEMOGLOBIN: 10.2 g/dL — AB (ref 12.0–15.0)
MCH: 28.2 pg (ref 26.0–34.0)
MCHC: 32.9 g/dL (ref 30.0–36.0)
MCV: 85.6 fL (ref 78.0–100.0)
MPV: 9.8 fL (ref 8.6–12.4)
Platelets: 150 10*3/uL (ref 150–400)
RBC: 3.62 MIL/uL — ABNORMAL LOW (ref 3.87–5.11)
RDW: 15.2 % (ref 11.5–15.5)
WBC: 8.7 10*3/uL (ref 4.0–10.5)

## 2015-09-14 LAB — RPR

## 2015-09-14 LAB — AMYLASE: Amylase: 31 U/L (ref 0–105)

## 2015-09-14 LAB — HIV ANTIBODY (ROUTINE TESTING W REFLEX): HIV: NONREACTIVE

## 2015-09-14 NOTE — Telephone Encounter (Signed)
-----   Message from [place] Smith, [place] sent at 09/14/2015  8:41 AM EST ----- Please notify pt of normal bloodwork. Does not rule out possible gall stones, but does reassure us that there is not an emergent problem with gall bladder, pancrease, liver.

## 2015-09-14 NOTE — Telephone Encounter (Signed)
Called pt to adv normal lab results. Pt expressed understanding.  

## 2015-09-16 ENCOUNTER — Telehealth: Payer: Self-pay

## 2015-09-16 NOTE — Telephone Encounter (Signed)
Called pt to ask if she still wanted appt for the US abdomen and pt stated pain is less and she was ok with not getting it.

## 2015-10-04 ENCOUNTER — Ambulatory Visit (INDEPENDENT_AMBULATORY_CARE_PROVIDER_SITE_OTHER): Payer: BLUE CROSS/BLUE SHIELD | Admitting: Family

## 2015-10-04 VITALS — BP 113/71 | HR 94 | Wt 178.0 lb

## 2015-10-04 DIAGNOSIS — Z3491 Encounter for supervision of normal pregnancy, unspecified, first trimester: Secondary | ICD-10-CM

## 2015-10-04 DIAGNOSIS — Z3481 Encounter for supervision of other normal pregnancy, first trimester: Secondary | ICD-10-CM

## 2015-10-04 NOTE — Progress Notes (Signed)
Subjective:  Vanessa Larson is a 34 y.o. [id number] at 3853w6d being seen today for ongoing prenatal care.  She is currently monitored for the following issues for this low-risk pregnancy and has Anxiety; History of cesarean delivery affecting pregnancy; and Supervision of normal pregnancy in first trimester on her problem list.  Patient reports no complaints.  Contractions: Irritability. Vag. Bleeding: None.  Movement: Present. Denies leaking of fluid.   The following portions of the patient's history were reviewed and updated as appropriate: allergies, current medications, past family history, past medical history, past social history, past surgical history and problem list. Problem list updated.  Objective:   Filed Vitals:   10/04/15 1030  BP: 113/71  Pulse: 94  Weight: 178 lb (80.74 kg)    Fetal Status: Fetal Heart Rate (bpm): 143 Fundal Height: 32 cm Movement: Present  Presentation: Vertex  General:  Alert, oriented and cooperative. Patient is in no acute distress.  Skin: Skin is warm and dry. No rash noted.   Cardiovascular: Normal heart rate noted  Respiratory: Normal respiratory effort, no problems with respiration noted  Abdomen: Soft, gravid, appropriate for gestational age. Pain/Pressure: Absent     Pelvic: Vag. Bleeding: None Vag D/C Character: Thin   Cervical exam deferred        Extremities: Normal range of motion.  Edema: None  Mental Status: Normal mood and affect. Normal behavior. Normal judgment and thought content.   Urinalysis: Urine Protein: Negative Urine Glucose: Negative  Assessment and Plan:  Pregnancy: A5W0981G5P3014 at 2653w6d  1. Supervision of normal pregnancy in first trimester - Reviewed 1 hr results - Discussed getting growth ultrasound towards end of pregnancy due to concerns  Preterm labor symptoms and general obstetric precautions including but not limited to vaginal bleeding, contractions, leaking of fluid and fetal movement were reviewed in detail with the  patient. Please refer to After Visit Summary for other counseling recommendations.  Return in about 2 weeks (around 10/18/2015).   Eino FarberWalidah Kennith GainN Karim, CNM

## 2015-10-18 ENCOUNTER — Ambulatory Visit (INDEPENDENT_AMBULATORY_CARE_PROVIDER_SITE_OTHER): Payer: BLUE CROSS/BLUE SHIELD | Admitting: Advanced Practice Midwife

## 2015-10-18 VITALS — BP 105/54 | HR 95 | Wt 179.0 lb

## 2015-10-18 DIAGNOSIS — Z3483 Encounter for supervision of other normal pregnancy, third trimester: Secondary | ICD-10-CM

## 2015-10-18 DIAGNOSIS — O34219 Maternal care for unspecified type scar from previous cesarean delivery: Secondary | ICD-10-CM

## 2015-10-18 NOTE — Progress Notes (Signed)
Subjective:  Vanessa Larson is a 34 y.o. [id number] at 2047w6d being seen today for ongoing prenatal care.  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  Patient reports no complaints.  Contractions: Not present. Vag. Bleeding: None.  Movement: Present. Denies leaking of fluid.   The following portions of the patient's history were reviewed and updated as appropriate: allergies, current medications, past family history, past medical history, past social history, past surgical history and problem list. Problem list updated.  Objective:   Filed Vitals:   10/18/15 0906  BP: 105/54  Pulse: 95  Weight: 179 lb (81.194 kg)    Fetal Status: Fetal Heart Rate (bpm): 134 Fundal Height: 33 cm Movement: Present  Presentation: Vertex  General:  Alert, oriented and cooperative. Patient is in no acute distress.  Skin: Skin is warm and dry. No rash noted.   Cardiovascular: Normal heart rate noted  Respiratory: Normal respiratory effort, no problems with respiration noted  Abdomen: Soft, gravid, appropriate for gestational age. Pain/Pressure: Absent     Pelvic: Vag. Bleeding: None Vag D/C Character: Thin   Cervical exam deferred        Extremities: Normal range of motion.  Edema: None  Mental Status: Normal mood and affect. Normal behavior. Normal judgment and thought content.   Urinalysis: Urine Protein: Negative Urine Glucose: Negative  Assessment and Plan:  Pregnancy: [id number] at 4747w6d  1. Hx of cesarean section complicating pregnancy  --Pt worried that she is larger than with previous pregnancy, vaginal deliveries 8lb6oz, then 8lb11oz. Concerned that this baby could be larger.  Hx C/S x 1 then VBAC x 2.  Fundal height appropriate today. Reassurance provided  that infant likely average sized for gestational age.  Growth US ordered for EFW at 37 weeks. - US MFM OB FOLLOW UP; Future  Term labor symptoms and general obstetric precautions including but not limited to vaginal bleeding, contractions, leaking of fluid and fetal movement were reviewed in detail with the patient. Please refer to After Visit Summary for other counseling recommendations.  Return in about 2 weeks (around 11/01/2015).   Hurshel PartyLisa A Leftwich-Kirby, CNM

## 2015-10-31 NOTE — L&D Delivery Note (Signed)
Patient is 35 y.o. 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  AROM performed - green fluid; no other augmentation of labor.   Delivery Note At 7:31 AM a viable and healthy female was delivered via VBAC, Spontaneous (Presentation: Right Occiput Anterior).  APGAR: 9, 9; weight  pending.   Placenta status: Intact, Spontaneous.  Cord: 3 vessels with the following complications: None.   Anesthesia: Local  Episiotomy: None Lacerations: 2nd degree;Perineal Suture Repair: 3.0 vicryl Est. Blood Loss (mL): 39  Mom to postpartum.  Baby to Couplet care / Skin to Skin.  Amber Heckart 12/02/2015, 8:46 AM      Upon arrival patient was complete and pushing in kneeling position. She pushed with good maternal effort to deliver a healthy baby boy. Baby delivered without difficulty, was noted to have good tone and placed on maternal abdomen after oral suctioning, drying and stimulation due to maternal sitting position during delivery. Delayed cord clamping performed. Placenta delivered intact with 3V cord. Vaginal canal and perineum was inspected and 2nd degree laceration repaired in standard fashion; hemostatic. Pitocin was started and uterus massaged until bleeding slowed. Counts of sharps, instruments, and lap pads were all correct.   Wynne Dust, MD, PGY-1   Patient is a [phone] at [redacted]w[redacted]d who was admitted in early active labor, significant hx of C/S x 1 followed by VBAC x 2 and essentially uncomplicated prenatal course.  She progressed with augmentation via AROM.  I was gloved and present for this hands & knees delivery in its entirety.  Second stage of labor progressed to SVD;  minimal decels during second stage noted.  Complications: none   Cam Hai, CNM 9:01 AM  12/02/2015

## 2015-11-01 ENCOUNTER — Ambulatory Visit (INDEPENDENT_AMBULATORY_CARE_PROVIDER_SITE_OTHER): Payer: BLUE CROSS/BLUE SHIELD | Admitting: Family

## 2015-11-01 VITALS — BP 119/69 | HR 104 | Wt 180.0 lb

## 2015-11-01 DIAGNOSIS — N898 Other specified noninflammatory disorders of vagina: Secondary | ICD-10-CM | POA: Diagnosis not present

## 2015-11-01 DIAGNOSIS — O34219 Maternal care for unspecified type scar from previous cesarean delivery: Secondary | ICD-10-CM

## 2015-11-01 DIAGNOSIS — Z3481 Encounter for supervision of other normal pregnancy, first trimester: Secondary | ICD-10-CM

## 2015-11-01 DIAGNOSIS — Z3491 Encounter for supervision of normal pregnancy, unspecified, first trimester: Secondary | ICD-10-CM

## 2015-11-01 DIAGNOSIS — O26893 Other specified pregnancy related conditions, third trimester: Secondary | ICD-10-CM

## 2015-11-01 NOTE — Progress Notes (Signed)
Subjective:  Vanessa HartshornMolly M Larson is a 35 y.o. [id number] at 6435w6d being seen today for ongoing prenatal care.  She is currently monitored for the following issues for this high-risk pregnancy and obsessive-compulsive personality trait; Anxiety; History of cesarean delivery affecting pregnancy; and supervision of normal pregnancy in first trimester on her problem list.  Expressed increased anxiety with not having waterbirth.  Desires to be able to squat to push.  Patient reports intermittent white vaginal discharge.  Denies vaginal itching or odor..  Contractions: Irregular. Vag. Bleeding: None.  Movement: Present. Denies leaking of fluid.   The following portions of the patient's history were reviewed and updated as appropriate: allergies, current medications, past family history, past medical history, past social history, past surgical history and problem list. Problem list updated.  Objective:   Filed Vitals:   11/01/15 0932  BP: 119/69  Pulse: 104  Weight: 180 lb (81.647 kg)    Fetal Status: Fetal Heart Rate (bpm): 152 Fundal Height: 35 cm Movement: Present     General:  Alert, oriented and cooperative. Patient is in no acute distress.  Skin: Skin is warm and dry. No rash noted.   Cardiovascular: Normal heart rate noted  Respiratory: Normal respiratory effort, no problems with respiration noted  Abdomen: Soft, gravid, appropriate for gestational age. Pain/Pressure: Absent     Pelvic: Vag. Bleeding: None Vag D/C Character: White   Cervical exam deferred        Extremities: Normal range of motion.  Edema: None  Mental Status: Normal mood and affect. Normal behavior. Normal judgment and thought content.   Urinalysis: Urine Protein: Trace Urine Glucose: Negative  Assessment and Plan:  Pregnancy: [id number] at 1735w6d  1. Supervision of normal pregnancy in first trimester   2. History of cesarean delivery affecting pregnancy  3.  Vaginal Discharge - Wet prep  Preterm labor symptoms and  general obstetric precautions including but not limited to vaginal bleeding, contractions, leaking of fluid and fetal movement were reviewed in detail with the patient. Please refer to After Visit Summary for other counseling recommendations.   Discussed various option with regards to pushing.    No Follow-up on file.   Eino FarberWalidah Kennith GainN Karim, CNM

## 2015-11-01 NOTE — Addendum Note (Signed)
Addended by: Arne ClevelandHUTCHINSON, MANDY J on: 11/01/2015 10:17 AM   Modules accepted: Orders

## 2015-11-01 NOTE — Progress Notes (Signed)
Pt states she has increased white vaginal discharge - possible yeast

## 2015-11-03 ENCOUNTER — Telehealth: Payer: Self-pay | Admitting: *Deleted

## 2015-11-03 DIAGNOSIS — B9689 Other specified bacterial agents as the cause of diseases classified elsewhere: Secondary | ICD-10-CM

## 2015-11-03 DIAGNOSIS — N760 Acute vaginitis: Principal | ICD-10-CM

## 2015-11-03 LAB — WET PREP BY MOLECULAR PROBE
CANDIDA SPECIES: NEGATIVE
Gardnerella vaginalis: POSITIVE — AB
TRICHOMONAS VAG: NEGATIVE

## 2015-11-03 MED ORDER — METRONIDAZOLE 500 MG PO TABS: 500.0000 mg | ORAL_TABLET | Freq: Two times a day (BID) | ORAL | Status: DC

## 2015-11-03 NOTE — Telephone Encounter (Signed)
LM on voicmeial of positive for BV and Flagyl was sent to [facility] on [address] [place] per Margarita MailW Karim, CNM

## 2015-11-08 ENCOUNTER — Encounter: Payer: BLUE CROSS/BLUE SHIELD | Admitting: Advanced Practice Midwife

## 2015-11-09 ENCOUNTER — Ambulatory Visit (INDEPENDENT_AMBULATORY_CARE_PROVIDER_SITE_OTHER): Payer: BLUE CROSS/BLUE SHIELD | Admitting: Obstetrics & Gynecology

## 2015-11-09 VITALS — BP 126/71 | HR 90 | Wt 182.0 lb

## 2015-11-09 DIAGNOSIS — Z3491 Encounter for supervision of normal pregnancy, unspecified, first trimester: Secondary | ICD-10-CM

## 2015-11-09 DIAGNOSIS — Z3483 Encounter for supervision of other normal pregnancy, third trimester: Secondary | ICD-10-CM

## 2015-11-09 DIAGNOSIS — Z36 Encounter for antenatal screening of mother: Secondary | ICD-10-CM

## 2015-11-09 DIAGNOSIS — O2643 Herpes gestationis, third trimester: Secondary | ICD-10-CM

## 2015-11-09 DIAGNOSIS — O34219 Maternal care for unspecified type scar from previous cesarean delivery: Secondary | ICD-10-CM

## 2015-11-09 DIAGNOSIS — Z113 Encounter for screening for infections with a predominantly sexual mode of transmission: Secondary | ICD-10-CM | POA: Diagnosis not present

## 2015-11-09 DIAGNOSIS — Z3493 Encounter for supervision of normal pregnancy, unspecified, third trimester: Secondary | ICD-10-CM

## 2015-11-09 LAB — OB RESULTS CONSOLE GC/CHLAMYDIA: GC PROBE AMP, GENITAL: NEGATIVE

## 2015-11-09 LAB — OB RESULTS CONSOLE GBS: STREP GROUP B AG: POSITIVE

## 2015-11-09 MED ORDER — VALACYCLOVIR HCL 1 G PO TABS: ORAL_TABLET | ORAL | Status: DC

## 2015-11-09 NOTE — Addendum Note (Signed)
Addended by: Allie BossierVE, Shamell Hittle C on: 11/09/2015 03:48 PM   Modules accepted: Orders

## 2015-11-09 NOTE — Progress Notes (Signed)
Pt thinks she has lost her mucous plug over the weekend.  Discuss possible need for starting Valtrex

## 2015-11-09 NOTE — Progress Notes (Signed)
Subjective:  Vanessa HartshornMolly M Larson is a 35 y.o. [id number] at 3372w0d being seen today for ongoing prenatal care.  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  Patient reports perineal lesion c/w her last/first HSV outbreak 4/16.[name].  Contractions: Irregular. Vag. Bleeding: None.  Movement: Present. Denies leaking of fluid.   The following portions of the patient's history were reviewed and updated as appropriate: allergies, current medications, past family history, past medical history, past social history, past surgical history and problem list. Problem list updated.  Objective:   Filed Vitals:   11/09/15 1028  BP: 126/71  Pulse: 90  Weight: 182 lb (82.555 kg)    Fetal Status:     Movement: Present     General:  Alert, oriented and cooperative. Patient is in no acute distress.  Skin: Skin is warm and dry. No rash noted.   Cardiovascular: Normal heart rate noted  Respiratory: Normal respiratory effort, no problems with respiration noted  Abdomen: Soft, gravid, appropriate for gestational age. Pain/Pressure: Present     Pelvic: Vag. Bleeding: None Vag D/C Character: Mucous   Cervical exam deferred        Extremities: Normal range of motion.  Edema: None  Mental Status: Normal mood and affect. Normal behavior. Normal judgment and thought content.   Urinalysis: Urine Protein: Negative Urine Glucose: Negative  Assessment and Plan:  Pregnancy: [id number] at 3572w0d  1. Supervision of normal pregnancy in third trimester  - Culture, beta strep (group b only) - Urine cytology ancillary only  2. 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 Start Valtrex today  3. History of cesarean delivery  affecting pregnancy - She would like a TOLAC if HSV has resolved  4. Supervision of normal pregnancy in first trimester   Preterm labor symptoms and general obstetric precautions including but not limited to vaginal bleeding, contractions, leaking of fluid and fetal movement were reviewed in detail with the patient. Please refer to After Visit Summary for other counseling recommendations.  Return in about 1 week (around 11/16/2015).   Allie BossierMyra C Keaira Whitehurst, MD

## 2015-11-11 LAB — URINE CYTOLOGY ANCILLARY ONLY
CHLAMYDIA, DNA PROBE: NEGATIVE
Neisseria Gonorrhea: NEGATIVE

## 2015-11-12 ENCOUNTER — Encounter: Payer: BLUE CROSS/BLUE SHIELD | Admitting: Advanced Practice Midwife

## 2015-11-12 LAB — CULTURE, BETA STREP (GROUP B ONLY)

## 2015-11-15 ENCOUNTER — Ambulatory Visit (INDEPENDENT_AMBULATORY_CARE_PROVIDER_SITE_OTHER): Payer: BLUE CROSS/BLUE SHIELD | Admitting: Certified Nurse Midwife

## 2015-11-15 VITALS — BP 105/81 | HR 92 | Wt 184.0 lb

## 2015-11-15 DIAGNOSIS — Z3491 Encounter for supervision of normal pregnancy, unspecified, first trimester: Secondary | ICD-10-CM

## 2015-11-15 DIAGNOSIS — O34219 Maternal care for unspecified type scar from previous cesarean delivery: Secondary | ICD-10-CM

## 2015-11-15 DIAGNOSIS — O2643 Herpes gestationis, third trimester: Secondary | ICD-10-CM

## 2015-11-15 DIAGNOSIS — Z3483 Encounter for supervision of other normal pregnancy, third trimester: Secondary | ICD-10-CM

## 2015-11-15 NOTE — Progress Notes (Signed)
Subjective:  Vanessa Larson is a 35 y.o. [id number] at 1946w6d being seen today for ongoing prenatal care.  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  Patient reports reports vaginal lesion is still therer with no improvement after taking valtrex for a week.  Contractions: Irritability. Vag. Bleeding: None.  Movement: Present. Denies leaking of fluid.   The following portions of the patient's history were reviewed and updated as appropriate: allergies, current medications, past family history, past medical history, past social history, past surgical history and problem list. Problem list updated.  Objective:   Filed Vitals:   11/15/15 0947  BP: 105/81  Pulse: 92  Weight: 184 lb (83.462 kg)    Fetal Status: Fetal Heart Rate (bpm): 144   Movement: Present     General:  Alert, oriented and cooperative. Patient is in no acute distress.  Skin: Skin is warm and dry. No rash noted.   Cardiovascular: Normal heart rate noted  Respiratory: Normal respiratory effort, no problems with respiration noted  Abdomen: Soft, gravid, appropriate for gestational age. Pain/Pressure: Present     Pelvic: Vag. Bleeding: None Vag D/C Character: Thin   Cervical exam deferred        Extremities: Normal range of motion.  Edema: None  Mental Status: Normal mood and affect. Normal behavior. Normal judgment and thought content.   Urinalysis: Urine Protein: Negative Urine Glucose: Negative  Assessment and Plan:  Pregnancy: A5W0981G5P3014 at 9146w6d  1. History of cesarean delivery affecting pregnancy   2. Supervision of normal pregnancy in first trimester   3. Herpes gestationis in third trimester Valtrex  Preterm labor symptoms and  general obstetric precautions including but not limited to vaginal bleeding, contractions, leaking of fluid and fetal movement were reviewed in detail with the patient. Please refer to After Visit Summary for other counseling recommendations.  Return in about 1 week (around 11/22/2015).   Rhea PinkLori A Clemmons, CNM

## 2015-11-15 NOTE — Patient Instructions (Signed)

## 2015-11-16 ENCOUNTER — Ambulatory Visit (HOSPITAL_COMMUNITY)
Admission: RE | Admit: 2015-11-16 | Discharge: 2015-11-16 | Disposition: A | Discharge status: Home / Self Care | Payer: BLUE CROSS/BLUE SHIELD | Source: Ambulatory Visit | Attending: Advanced Practice Midwife | Admitting: Advanced Practice Midwife

## 2015-11-16 DIAGNOSIS — Z3A37 37 weeks gestation of pregnancy: Secondary | ICD-10-CM | POA: Insufficient documentation

## 2015-11-16 DIAGNOSIS — O26843 Uterine size-date discrepancy, third trimester: Secondary | ICD-10-CM | POA: Diagnosis present

## 2015-11-16 DIAGNOSIS — O34219 Maternal care for unspecified type scar from previous cesarean delivery: Secondary | ICD-10-CM | POA: Diagnosis not present

## 2015-11-22 ENCOUNTER — Ambulatory Visit (INDEPENDENT_AMBULATORY_CARE_PROVIDER_SITE_OTHER): Payer: BLUE CROSS/BLUE SHIELD | Admitting: Advanced Practice Midwife

## 2015-11-22 VITALS — BP 100/69 | HR 94 | Wt 183.0 lb

## 2015-11-22 DIAGNOSIS — Z22330 Carrier of Group B streptococcus: Secondary | ICD-10-CM

## 2015-11-22 DIAGNOSIS — O99820 Streptococcus B carrier state complicating pregnancy: Secondary | ICD-10-CM

## 2015-11-22 DIAGNOSIS — Z3483 Encounter for supervision of other normal pregnancy, third trimester: Secondary | ICD-10-CM

## 2015-11-22 MED ORDER — BUPROPION HCL ER (SR) 150 MG PO TB12: 150.0000 mg | ORAL_TABLET | Freq: Two times a day (BID) | ORAL | Status: DC

## 2015-11-22 NOTE — Progress Notes (Signed)
Subjective:  Vanessa Larson is a 35 y.o. [id number] at [redacted]w[redacted]d being seen today for ongoing prenatal care.  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  Patient reports some anxiety and persistent vulvar 'bump".  Contractions: Irregular. Vag. Bleeding: None.  Movement: Present. Denies leaking of fluid.   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 Wants to start Wellbutrin prior to delivery if possible. Consulted Dr Adrian Blackwater who ok'ed it. I also sent a message to her primary care PA.  Has persistent "bump" on perineum at introitus. States Dr Marice Potter told her it was probably herpes and started her on double dose of Valtrex and told her she would need a C/S. Patient wants second opinion on whether it is herpes.  My exam shows a small 2mm white/yellow round circumscribed papule which looks like a sebaceous cyst to me. There is some erethema just posterior to it, pt states the area looks exactly like it did 2 weeks ago, has never had any pain or discomfort there.   I offered a culture, but discussed possibility that it would come back neg. While still being positive (cultures sometimes yield neg results).  Discussed having Dr Penne Lash look at it this week for recommendations. The following portions of the patient's history were reviewed and updated as appropriate: allergies, current medications, past family history, past medical history, past social history, past surgical history and problem list. Problem list updated.  Objective:   Filed Vitals:   11/22/15 1107  BP: 100/69  Pulse: 94  Weight: 83.008 kg (183 lb)    Fetal  Status: Fetal Heart Rate (bpm): 152   Movement: Present     General:  Alert, oriented and cooperative. Patient is in no acute distress.  Skin: Skin is warm and dry. No rash noted.   Cardiovascular: Normal heart rate noted  Respiratory: Normal respiratory effort, no problems with respiration noted  Abdomen: Soft, gravid, appropriate for gestational age. Pain/Pressure: Present     Pelvic: Vag. Bleeding: None Vag D/C Character: Thin   Cervical exam deferred        Extremities: Normal range of motion.  Edema: None  Mental Status: Normal mood and affect. Normal behavior. Normal judgment and thought content.   Urinalysis: Urine Protein: Negative Urine Glucose: Negative  Assessment and Plan:  Pregnancy: O9G2952 at [redacted]w[redacted]d  1. GBS (group B Streptococcus carrier), +RV culture, currently pregnant    Treat in labor  Term labor symptoms and general obstetric precautions including but not limited to vaginal bleeding, contractions, leaking of fluid and fetal movement were reviewed in detail with the patient. Please refer to After Visit Summary for other counseling recommendations.   RTO tomorrow for lesion exam, then weekly until delivery.  Aviva Signs, CNM

## 2015-11-22 NOTE — Patient Instructions (Signed)
Vaginal Delivery °During delivery, your health care provider will help you give birth to your baby. During a vaginal delivery, you will work to push the baby out of your vagina. However, before you can push your baby out, a few things need to happen. The opening of your uterus (cervix) has to soften, thin out, and open up (dilate) all the way to 10 cm. Also, your baby has to move down from the uterus into your vagina.  °SIGNS OF LABOR  °Your health care provider will first need to make sure you are in labor. Signs of labor include:  °· Passing what is called the mucous plug before labor begins. This is a small amount of blood-stained mucus. °· Having regular, painful uterine contractions.   °· The time between contractions gets shorter.   °· The discomfort and pain gradually get more intense. °· Contraction pains get worse when walking and do not go away when resting.   °· Your cervix becomes thinner (effacement) and dilates. °BEFORE THE DELIVERY °Once you are in labor and admitted into the hospital or care center, your health care provider may do the following:  °· Perform a complete physical exam. °· Review any complications related to pregnancy or labor.  °· Check your blood pressure, pulse, temperature, and heart rate (vital signs).   °· Determine if, and when, the rupture of amniotic membranes occurred. °· Do a vaginal exam (using a sterile glove and lubricant) to determine:   °¨ The position (presentation) of the baby. Is the baby's head presenting first (vertex) in the birth canal (vagina), or are the feet or buttocks first (breech)?   °¨ The level (station) of the baby's head within the birth canal.   °¨ The effacement and dilatation of the cervix.   °· An electronic fetal monitor is usually placed on your abdomen when you first arrive. This is used to monitor your contractions and the baby's heart rate. °¨ When the monitor is on your abdomen (external fetal monitor), it can only pick up the frequency and  length of your contractions. It cannot tell the strength of your contractions. °¨ If it becomes necessary for your health care provider to know exactly how strong your contractions are or to see exactly what the baby's heart rate is doing, an internal monitor may be inserted into your vagina and uterus. Your health care provider will discuss the benefits and risks of using an internal monitor and obtain your permission before inserting the device. °¨ Continuous fetal monitoring may be needed if you have an epidural, are receiving certain medicines (such as oxytocin), or have pregnancy or labor complications. °· An IV access tube may be placed into a vein in your arm to deliver fluids and medicines if necessary. °THREE STAGES OF LABOR AND DELIVERY °Normal labor and delivery is divided into three stages. °First Stage °This stage starts when you begin to contract regularly and your cervix begins to efface and dilate. It ends when your cervix is completely open (fully dilated). The first stage is the longest stage of labor and can last from 3 hours to 15 hours.  °Several methods are available to help with labor pain. You and your health care provider will decide which option is best for you. Options include:  °· Opioid medicines. These are strong pain medicines that you can get through your IV tube or as a shot into your muscle. These medicines lessen pain but do not make it go away completely.  °· Epidural. A medicine is given through a thin tube that   is inserted in your back. The medicine numbs the lower part of your body and prevents any pain in that area. °· Paracervical pain medicine. This is an injection of an anesthetic on each side of your cervix.   °· You may request natural childbirth, which does not involve the use of pain medicines or an epidural during labor and delivery. Instead, you will use other things, such as breathing exercises, to help cope with the pain. °Second Stage °The second stage of labor  begins when your cervix is fully dilated at 10 cm. It continues until you push your baby down through the birth canal and the baby is born. This stage can take only minutes or several hours. °· The location of your baby's head as it moves through the birth canal is reported as a number called a station. If the baby's head has not started its descent, the station is described as being at minus 3 (-3). When your baby's head is at the zero station, it is at the middle of the birth canal and is engaged in the pelvis. The station of your baby helps indicate the progress of the second stage of labor. °· When your baby is born, your health care provider may hold the baby with his or her head lowered to prevent amniotic fluid, mucus, and blood from getting into the baby's lungs. The baby's mouth and nose may be suctioned with a small bulb syringe to remove any additional fluid. °· Your health care provider may then place the baby on your stomach. It is important to keep the baby from getting cold. To do this, the health care provider will dry the baby off, place the baby directly on your skin (with no blankets between you and the baby), and cover the baby with warm, dry blankets.   °· The umbilical cord is cut. °Third Stage °During the third stage of labor, your health care provider will deliver the placenta (afterbirth) and make sure your bleeding is under control. The delivery of the placenta usually takes about 5 minutes but can take up to 30 minutes. After the placenta is delivered, a medicine may be given either by IV or injection to help contract the uterus and control bleeding. If you are planning to breastfeed, you can try to do so now. °After you deliver the placenta, your uterus should contract and get very firm. If your uterus does not remain firm, your health care provider will massage it. This is important because the contraction of the uterus helps cut off bleeding at the site where the placenta was attached  to your uterus. If your uterus does not contract properly and stay firm, you may continue to bleed heavily. If there is a lot of bleeding, medicines may be given to contract the uterus and stop the bleeding.  °  °This information is not intended to replace advice given to you by your health care provider. Make sure you discuss any questions you have with your health care provider. °  °Document Released: 07/25/2008 Document Revised: 11/06/2014 Document Reviewed: 06/12/2012 °Elsevier Interactive Patient Education ©2016 [employer] ° °

## 2015-11-23 ENCOUNTER — Ambulatory Visit (INDEPENDENT_AMBULATORY_CARE_PROVIDER_SITE_OTHER): Payer: BLUE CROSS/BLUE SHIELD | Admitting: Obstetrics & Gynecology

## 2015-11-23 VITALS — BP 116/72 | HR 100 | Wt 184.0 lb

## 2015-11-23 DIAGNOSIS — R42 Dizziness and giddiness: Secondary | ICD-10-CM

## 2015-11-23 DIAGNOSIS — O368130 Decreased fetal movements, third trimester, not applicable or unspecified: Secondary | ICD-10-CM

## 2015-11-23 DIAGNOSIS — Z113 Encounter for screening for infections with a predominantly sexual mode of transmission: Secondary | ICD-10-CM | POA: Diagnosis not present

## 2015-11-23 DIAGNOSIS — Z3483 Encounter for supervision of other normal pregnancy, third trimester: Secondary | ICD-10-CM

## 2015-11-23 DIAGNOSIS — N909 Noninflammatory disorder of vulva and perineum, unspecified: Secondary | ICD-10-CM

## 2015-11-23 NOTE — Progress Notes (Signed)
Pt called with c/o's of decreased fetal movement this morning for about 2 hours.  Pt has high anxiety due to a pregnancy loss last year.  Pt brought in for a NST which was reassurring.  She will return this afternoon for a consult with Dr Penne Lash about another matter.

## 2015-11-23 NOTE — Progress Notes (Signed)
Subjective:  RILEI KRAVITZ is a 35 y.o. [id number] at [redacted]w[redacted]d being seen today for ongoing prenatal care.  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  Patient reports non painful vaginal lesion that has been present for 3 weeks.  Contractions: Irregular. Vag. Bleeding: None.  Movement: (!) Decreased. Denies leaking of fluid.   The following portions of the patient's history were reviewed and updated as appropriate: allergies, current medications, past family history, past medical history, past social history, past surgical history and problem list. Problem list updated.  Objective:   Filed Vitals:   11/23/15 0902  BP: 116/72  Pulse: 100  Weight: 184 lb (83.462 kg)    Fetal Status: Fetal Heart Rate (bpm): NST-R   Movement: (!) Decreased     General:  Alert, oriented and cooperative. Patient is in no acute distress.  Skin: Skin is warm and dry. No rash noted.   Cardiovascular: Normal heart rate noted  Respiratory: Normal respiratory effort, no problems with respiration noted  Abdomen: Soft, gravid, appropriate for gestational age. Pain/Pressure: Present     Pelvic: Vag. Bleeding: None Vag D/C Character: Thin   Cervical exam deferred        Extremities: Normal range of motion.  Edema: None  Mental Status: Normal mood and affect. Normal behavior. Normal judgment and thought content.   Urinalysis: Urine Protein: Negative Urine Glucose: Negative  Assessment and Plan:  Pregnancy: J4N8295 at [redacted]w[redacted]d  1. Decreased fetal movement, third trimester, not applicable or unspecified fetus Reactive NST today. - Fetal nonstress test; Future - Herpes  simplex virus culture  2.  Vulvar lesion  Yellowish 2mm lesion c/w sebaceous cyst.  There is some surrounding erythema but looks like old scar from last obstetrical laceration.  Non painful.  Has been unchanged for 3 weeks.  Doubt HSV.  Offered to lance the cyst but patient declines.  I feel she is safe for vaginal delivery.  HSV culture sent of red area but yellowish lesion is NOT HSV.  Term labor symptoms and general obstetric precautions including but not limited to vaginal bleeding, contractions, leaking of fluid and fetal movement were reviewed in detail with the patient. Please refer to After Visit Summary for other counseling recommendations.  Return in about 1 week (around 11/30/2015).   Lesly Dukes, MD

## 2015-11-24 ENCOUNTER — Telehealth: Payer: Self-pay | Admitting: *Deleted

## 2015-11-24 LAB — CBC
HCT: 34.1 % — ABNORMAL LOW (ref 36.0–46.0)
HEMOGLOBIN: 11.4 g/dL — AB (ref 12.0–15.0)
MCH: 27.5 pg (ref 26.0–34.0)
MCHC: 33.4 g/dL (ref 30.0–36.0)
MCV: 82.2 fL (ref 78.0–100.0)
MPV: 10.2 fL (ref 8.6–12.4)
Platelets: 192 10*3/uL (ref 150–400)
RBC: 4.15 MIL/uL (ref 3.87–5.11)
RDW: 16.5 % — ABNORMAL HIGH (ref 11.5–15.5)
WBC: 9.5 10*3/uL (ref 4.0–10.5)

## 2015-11-24 NOTE — Telephone Encounter (Signed)
Pt notified of normal CBC 

## 2015-11-25 LAB — HERPES SIMPLEX VIRUS CULTURE: ORGANISM ID, BACTERIA: NOT DETECTED

## 2015-11-26 ENCOUNTER — Telehealth: Payer: Self-pay | Admitting: *Deleted

## 2015-11-26 NOTE — Telephone Encounter (Signed)
Pt notified of neg HSV culture

## 2015-11-26 NOTE — Telephone Encounter (Signed)
-----   Message from Lesly Dukes, MD sent at 11/26/2015  5:51 AM EST ----- HSV neg. RN to call given near her due date.

## 2015-11-29 ENCOUNTER — Encounter: Payer: Self-pay | Admitting: *Deleted

## 2015-11-29 ENCOUNTER — Ambulatory Visit (INDEPENDENT_AMBULATORY_CARE_PROVIDER_SITE_OTHER): Payer: BLUE CROSS/BLUE SHIELD | Admitting: Advanced Practice Midwife

## 2015-11-29 VITALS — BP 100/69 | HR 80 | Wt 184.0 lb

## 2015-11-29 DIAGNOSIS — O99820 Streptococcus B carrier state complicating pregnancy: Secondary | ICD-10-CM

## 2015-11-29 DIAGNOSIS — Z22330 Carrier of Group B streptococcus: Secondary | ICD-10-CM

## 2015-11-29 DIAGNOSIS — Z3491 Encounter for supervision of normal pregnancy, unspecified, first trimester: Secondary | ICD-10-CM

## 2015-11-29 DIAGNOSIS — O34219 Maternal care for unspecified type scar from previous cesarean delivery: Secondary | ICD-10-CM

## 2015-11-29 DIAGNOSIS — Z3481 Encounter for supervision of other normal pregnancy, first trimester: Secondary | ICD-10-CM

## 2015-11-29 NOTE — Progress Notes (Signed)
Subjective:  Vanessa Larson is a 35 y.o. E4V4098 at [redacted]w[redacted]d being seen today for ongoing prenatal care.  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  Patient reports occasional contractions.  Contractions: Irregular. Vag. Bleeding: None.  Movement: Present. Denies leaking of fluid.   The following portions of the patient's history were reviewed and updated as appropriate: allergies, current medications, past family history, past medical history, past social history, past surgical history and problem list. Problem list updated.  Objective:   Filed Vitals:   11/29/15 0926  BP: 100/69  Pulse: 80  Weight: 184 lb (83.462 kg)    Fetal Status: Fetal Heart Rate (bpm): 150 Fundal Height: 38 cm Movement: Present  Presentation: Vertex  General:  Alert, oriented and cooperative. Patient is in no acute distress.  Skin: Skin is warm and dry. No rash noted.   Cardiovascular: Normal heart rate noted  Respiratory: Normal respiratory effort, no problems with respiration noted  Abdomen: Soft, gravid, appropriate for gestational age. Pain/Pressure: Present     Pelvic: Vag. Bleeding: None Vag D/C Character: Thin   Cervical exam performed Dilation: 3 Effacement (%): 0 Station: -3  2 mm yellow, ?cyst at posterior fourchette. NT. No burning or pain. No change per pt.  Extremities: Normal range of motion.  Edema: None  Mental Status: Normal mood and affect. Normal behavior. Normal judgment and thought content.   Urinalysis: Urine Protein: Negative Urine Glucose: Negative  HSV culture neg 11/23/15.  Assessment and Plan:  Pregnancy: J1B1478 at [redacted]w[redacted]d  1. GBS  (group B Streptococcus carrier), +RV culture, currently pregnant -Plan PCN in labor  2. Supervision of normal pregnancy in first trimester   3. History of cesarean delivery affecting pregnancy - Planning TOLAC. Consent signed today, under Media tab  Term labor symptoms and general obstetric precautions including but not limited to vaginal bleeding, contractions, leaking of fluid and fetal movement were reviewed in detail with the patient. Please refer to After Visit Summary for other counseling recommendations.  Return in about 1 week (around 12/06/2015).   Dorathy Kinsman, CNM

## 2015-11-29 NOTE — Patient Instructions (Signed)
Fetal Movement Counts  Patient Name: __________________________________________________ Patient Due Date: ____________________  Performing a fetal movement count is highly recommended in high-risk pregnancies, but it is good for every pregnant woman to do. Your health care provider may ask you to start counting fetal movements at 28 weeks of the pregnancy. Fetal movements often increase:  · After eating a full meal.  · After physical activity.  · After eating or drinking something sweet or cold.  · At rest.  Pay attention to when you feel the baby is most active. This will help you notice a pattern of your baby's sleep and wake cycles and what factors contribute to an increase in fetal movement. It is important to perform a fetal movement count at the same time each day when your baby is normally most active.   HOW TO COUNT FETAL MOVEMENTS  1. Find a quiet and comfortable area to sit or lie down on your left side. Lying on your left side provides the best blood and oxygen circulation to your baby.  2. Write down the day and time on a sheet of paper or in a journal.  3. Start counting kicks, flutters, swishes, rolls, or jabs in a 2-hour period. You should feel at least 10 movements within 2 hours.  4. If you do not feel 10 movements in 2 hours, wait 2-3 hours and count again. Look for a change in the pattern or not enough counts in 2 hours.  SEEK MEDICAL CARE IF:  · You feel less than 10 counts in 2 hours, tried twice.  · There is no movement in over an hour.  · The pattern is changing or taking longer each day to reach 10 counts in 2 hours.  · You feel the baby is not moving as he or she usually does.  Date: ____________ Movements: ____________ Start time: ____________ Finish time: ____________   Date: ____________ Movements: ____________ Start time: ____________ Finish time: ____________  Date: ____________ Movements: ____________ Start time: ____________ Finish time: ____________  Date: ____________ Movements:  ____________ Start time: ____________ Finish time: ____________  Date: ____________ Movements: ____________ Start time: ____________ Finish time: ____________  Date: ____________ Movements: ____________ Start time: ____________ Finish time: ____________  Date: ____________ Movements: ____________ Start time: ____________ Finish time: ____________  Date: ____________ Movements: ____________ Start time: ____________ Finish time: ____________   Date: ____________ Movements: ____________ Start time: ____________ Finish time: ____________  Date: ____________ Movements: ____________ Start time: ____________ Finish time: ____________  Date: ____________ Movements: ____________ Start time: ____________ Finish time: ____________  Date: ____________ Movements: ____________ Start time: ____________ Finish time: ____________  Date: ____________ Movements: ____________ Start time: ____________ Finish time: ____________  Date: ____________ Movements: ____________ Start time: ____________ Finish time: ____________  Date: ____________ Movements: ____________ Start time: ____________ Finish time: ____________   Date: ____________ Movements: ____________ Start time: ____________ Finish time: ____________  Date: ____________ Movements: ____________ Start time: ____________ Finish time: ____________  Date: ____________ Movements: ____________ Start time: ____________ Finish time: ____________  Date: ____________ Movements: ____________ Start time: ____________ Finish time: ____________  Date: ____________ Movements: ____________ Start time: ____________ Finish time: ____________  Date: ____________ Movements: ____________ Start time: ____________ Finish time: ____________  Date: ____________ Movements: ____________ Start time: ____________ Finish time: ____________   Date: ____________ Movements: ____________ Start time: ____________ Finish time: ____________  Date: ____________ Movements: ____________ Start time: ____________ Finish  time: ____________  Date: ____________ Movements: ____________ Start time: ____________ Finish time: ____________  Date: ____________ Movements: ____________ Start time:   ____________ Finish time: ____________  Date: ____________ Movements: ____________ Start time: ____________ Finish time: ____________  Date: ____________ Movements: ____________ Start time: ____________ Finish time: ____________  Date: ____________ Movements: ____________ Start time: ____________ Finish time: ____________   Date: ____________ Movements: ____________ Start time: ____________ Finish time: ____________  Date: ____________ Movements: ____________ Start time: ____________ Finish time: ____________  Date: ____________ Movements: ____________ Start time: ____________ Finish time: ____________  Date: ____________ Movements: ____________ Start time: ____________ Finish time: ____________  Date: ____________ Movements: ____________ Start time: ____________ Finish time: ____________  Date: ____________ Movements: ____________ Start time: ____________ Finish time: ____________  Date: ____________ Movements: ____________ Start time: ____________ Finish time: ____________   Date: ____________ Movements: ____________ Start time: ____________ Finish time: ____________  Date: ____________ Movements: ____________ Start time: ____________ Finish time: ____________  Date: ____________ Movements: ____________ Start time: ____________ Finish time: ____________  Date: ____________ Movements: ____________ Start time: ____________ Finish time: ____________  Date: ____________ Movements: ____________ Start time: ____________ Finish time: ____________  Date: ____________ Movements: ____________ Start time: ____________ Finish time: ____________  Date: ____________ Movements: ____________ Start time: ____________ Finish time: ____________   Date: ____________ Movements: ____________ Start time: ____________ Finish time: ____________  Date: ____________  Movements: ____________ Start time: ____________ Finish time: ____________  Date: ____________ Movements: ____________ Start time: ____________ Finish time: ____________  Date: ____________ Movements: ____________ Start time: ____________ Finish time: ____________  Date: ____________ Movements: ____________ Start time: ____________ Finish time: ____________  Date: ____________ Movements: ____________ Start time: ____________ Finish time: ____________  Date: ____________ Movements: ____________ Start time: ____________ Finish time: ____________   Date: ____________ Movements: ____________ Start time: ____________ Finish time: ____________  Date: ____________ Movements: ____________ Start time: ____________ Finish time: ____________  Date: ____________ Movements: ____________ Start time: ____________ Finish time: ____________  Date: ____________ Movements: ____________ Start time: ____________ Finish time: ____________  Date: ____________ Movements: ____________ Start time: ____________ Finish time: ____________  Date: ____________ Movements: ____________ Start time: ____________ Finish time: ____________     This information is not intended to replace advice given to you by your health care provider. Make sure you discuss any questions you have with your health care provider.     Document Released: 11/15/2006 Document Revised: 11/06/2014 Document Reviewed: 08/12/2012  Elsevier Interactive Patient Education ©2016 [employer]

## 2015-11-30 ENCOUNTER — Encounter: Payer: Self-pay | Admitting: Advanced Practice Midwife

## 2015-12-01 ENCOUNTER — Encounter (HOSPITAL_COMMUNITY): Payer: Self-pay

## 2015-12-01 ENCOUNTER — Inpatient Hospital Stay (HOSPITAL_COMMUNITY)
Admission: AD | Admit: 2015-12-01 | Discharge: 2015-12-04 | DRG: 775 | Disposition: A | Discharge status: Home / Self Care | Payer: BLUE CROSS/BLUE SHIELD | Source: Ambulatory Visit | Attending: Obstetrics and Gynecology | Admitting: Obstetrics and Gynecology

## 2015-12-01 DIAGNOSIS — O9852 Other viral diseases complicating childbirth: Secondary | ICD-10-CM | POA: Diagnosis not present

## 2015-12-01 DIAGNOSIS — Z823 Family history of stroke: Secondary | ICD-10-CM

## 2015-12-01 DIAGNOSIS — Z3A39 39 weeks gestation of pregnancy: Secondary | ICD-10-CM

## 2015-12-01 DIAGNOSIS — F329 Major depressive disorder, single episode, unspecified: Secondary | ICD-10-CM | POA: Diagnosis present

## 2015-12-01 DIAGNOSIS — IMO0001 Reserved for inherently not codable concepts without codable children: Secondary | ICD-10-CM

## 2015-12-01 DIAGNOSIS — F605 Obsessive-compulsive personality disorder: Secondary | ICD-10-CM | POA: Diagnosis present

## 2015-12-01 DIAGNOSIS — O34219 Maternal care for unspecified type scar from previous cesarean delivery: Secondary | ICD-10-CM | POA: Diagnosis present

## 2015-12-01 DIAGNOSIS — O99344 Other mental disorders complicating childbirth: Secondary | ICD-10-CM | POA: Diagnosis present

## 2015-12-01 DIAGNOSIS — O99824 Streptococcus B carrier state complicating childbirth: Secondary | ICD-10-CM | POA: Diagnosis present

## 2015-12-01 DIAGNOSIS — O34211 Maternal care for low transverse scar from previous cesarean delivery: Secondary | ICD-10-CM | POA: Diagnosis not present

## 2015-12-01 LAB — CBC
HEMATOCRIT: 34.3 % — AB (ref 36.0–46.0)
Hemoglobin: 11.2 g/dL — ABNORMAL LOW (ref 12.0–15.0)
MCH: 27.4 pg (ref 26.0–34.0)
MCHC: 32.7 g/dL (ref 30.0–36.0)
MCV: 83.9 fL (ref 78.0–100.0)
PLATELETS: 176 10*3/uL (ref 150–400)
RBC: 4.09 MIL/uL (ref 3.87–5.11)
RDW: 16.6 % — AB (ref 11.5–15.5)
WBC: 12.9 10*3/uL — ABNORMAL HIGH (ref 4.0–10.5)

## 2015-12-01 LAB — TYPE AND SCREEN
ABO/RH(D): A POS
ANTIBODY SCREEN: NEGATIVE

## 2015-12-01 MED ORDER — SODIUM CHLORIDE 0.9 % IV SOLN
2.0000 g | Freq: Four times a day (QID) | INTRAVENOUS | Status: DC
  Administered 2015-12-02: 2 g via INTRAVENOUS
  Filled 2015-12-01 (×3): qty 2000

## 2015-12-01 MED ORDER — PENICILLIN G POTASSIUM 5000000 UNITS IJ SOLR: 2.5000 10*6.[IU] | INTRAVENOUS | Status: DC

## 2015-12-01 MED ORDER — OXYTOCIN 10 UNIT/ML IJ SOLN
2.5000 [IU]/h | INTRAVENOUS | Status: DC
  Administered 2015-12-02: 08:00:00 via INTRAVENOUS
  Filled 2015-12-01: qty 4

## 2015-12-01 MED ORDER — ONDANSETRON HCL 4 MG/2ML IJ SOLN: 4.0000 mg | Freq: Four times a day (QID) | INTRAMUSCULAR | Status: DC | PRN

## 2015-12-01 MED ORDER — OXYCODONE-ACETAMINOPHEN 5-325 MG PO TABS: 2.0000 | ORAL_TABLET | ORAL | Status: DC | PRN

## 2015-12-01 MED ORDER — LACTATED RINGERS IV SOLN: 500.0000 mL | INTRAVENOUS | Status: DC | PRN

## 2015-12-01 MED ORDER — OXYCODONE-ACETAMINOPHEN 5-325 MG PO TABS: 1.0000 | ORAL_TABLET | ORAL | Status: DC | PRN

## 2015-12-01 MED ORDER — LIDOCAINE HCL (PF) 1 % IJ SOLN
30.0000 mL | INTRAMUSCULAR | Status: DC | PRN
  Administered 2015-12-02: 30 mL via SUBCUTANEOUS
  Filled 2015-12-01: qty 30

## 2015-12-01 MED ORDER — CITRIC ACID-SODIUM CITRATE 334-500 MG/5ML PO SOLN: 30.0000 mL | ORAL | Status: DC | PRN

## 2015-12-01 MED ORDER — SODIUM CHLORIDE 0.9 % IV SOLN
2.0000 g | Freq: Once | INTRAVENOUS | Status: AC
  Administered 2015-12-01: 2 g via INTRAVENOUS
  Filled 2015-12-01: qty 2000

## 2015-12-01 MED ORDER — LACTATED RINGERS IV SOLN
INTRAVENOUS | Status: DC
  Administered 2015-12-01: 22:00:00 via INTRAVENOUS

## 2015-12-01 MED ORDER — ACETAMINOPHEN 325 MG PO TABS: 650.0000 mg | ORAL_TABLET | ORAL | Status: DC | PRN

## 2015-12-01 MED ORDER — FENTANYL CITRATE (PF) 100 MCG/2ML IJ SOLN: 100.0000 ug | INTRAMUSCULAR | Status: DC | PRN

## 2015-12-01 MED ORDER — OXYTOCIN BOLUS FROM INFUSION
500.0000 mL | INTRAVENOUS | Status: DC
  Administered 2015-12-02: 500 mL via INTRAVENOUS

## 2015-12-01 NOTE — MAU Note (Signed)
Notified provider that patient came in with c/o contractions 5/70/-2 on cervical exam. Provider said she would consult with Dr. Clelia Croft then come and speak with the patient.

## 2015-12-01 NOTE — MAU Note (Signed)
Patient presents with c/o contractions apart and some bloody show. Patient denies LOF.

## 2015-12-01 NOTE — H&P (Signed)
LABOR ADMISSION HISTORY AND PHYSICAL  Vanessa Larson is a 35 y.o. female [phone] with IUP at [redacted]w[redacted]d presenting for SOL. She reports frequent moderately painful contractions every 3 minutes.  Also had bloody show but no LOF.  Denies VB.  Some FM, but less than usual.  Denies burry vision, headaches, peripheral edema, or RUQ pain.  She plans on breast feeding. She request ?vasectomy for birth control. She did have PPH once after miscarriage. H/o C/S with 2 successful VBAC deliveries.   Dating: By 8wk u/s --->  Estimated Date of Delivery: 12/07/15  Sono:    , CWD, normal anatomy, cephalic presentation, 3256g, 45% EFW   Prenatal History/Complications: Depression Obsessive-compulsive personality trait H/O C/S, then successful VBAC x2 GBS+ PPH x1 with miscarriage   Past Medical History: Past Medical History  Diagnosis Date  . Anxiety   . Depression   . Pregnancy related condition in third trimester 2010    [redacted] weeks gestation complicated by depression/anxiety  . H/O abnormal Pap smear   . Ovarian cyst affecting pregnancy in first trimester, antepartum 05/28/2015    Past Surgical History: Past Surgical History  Procedure Laterality Date  . Cesarean section  2010    x 1  . Wisdom tooth extraction    . Dilation and evacuation N/A 12/06/2014    Procedure: DILATATION AND EVACUATION;  Surgeon: Reva Bores, MD;  Location: [facility];  Service: Gynecology;  Laterality: N/A;    Obstetrical History: OB History    Gravida Para Term Preterm AB TAB SAB Ectopic Multiple Living   Social History: Social History   Social History  . Marital Status: Married    Spouse Name: N/A  . Number of Children: N/A  . Years of Education: N/A   Social History Main Topics  . Smoking status: Never Smoker   . Smokeless tobacco: Never Used  . Alcohol Use: No  . Drug Use: No     Comment: Marijuana three times in college  . Sexual Activity:    Partners: Male    Herbalist: None     Comment: approx [redacted] wks gestation   Other Topics Concern  . None   Social History Narrative    Family History: Family History  Problem Relation Age of Onset  . Depression Mother   . Stroke Maternal Grandmother   . Cancer Maternal Grandfather     Allergies: No Known Allergies  Prescriptions prior to admission  Medication Sig Dispense Refill Last Dose  . buPROPion (WELLBUTRIN SR) 150 MG 12 hr tablet Take 1 tablet (150 mg total) by mouth 2 (two) times daily. 60 tablet 2 Past Week at Unknown time  . Prenatal Multivit-Min-Fe-FA (PRENATAL VITAMINS PO) Take 1 tablet by mouth daily.    Past Month at Unknown time  . valACYclovir (VALTREX) 1000 MG tablet As directed (Patient taking differently: Take 1,000 mg by mouth daily. As directed) 40 tablet 12 Past Week at Unknown time     Review of Systems   All systems reviewed and negative except as stated in HPI  BP 114/73 mmHg  Pulse 101  Temp(Src) 98.1 F (36.7 C) (Oral)  Resp 17  Ht  (1.651 m)  Wt 83.462 kg (184 lb)  BMI 30.62 kg/m2  LMP 01/17/2015 General appearance: alert, cooperative, appears stated age and no distress Lungs: clear to auscultation bilaterally Heart: regular rate and rhythm Abdomen: soft, non-tender; bowel  sounds normal Pelvic: adequate, CE below Extremities: Homans sign is negative, no sign of DVT, edema DTR's nl Presentation: cephalic Fetal monitoringBaseline: 140 bpm, Variability: Good {> 6 bpm) and Accelerations: Reactive Uterine activityFrequency: Every 2 minutes Dilation: 5 Effacement (%): 70 Station: -2 Exam by:: Kayren Eaves RN    Prenatal labs: ABO, Rh: A/POS/-- (06/17 0941) Antibody: NEG (06/17 0941) Rubella: !Error! immune RPR: NON REAC (11/14 1022)  HBsAg: NEGATIVE (06/17 0941)  HIV: NONREACTIVE (11/14 1022)  GBS:   positive 1 hr Glucola 96 (3rd tri) Genetic screening  declined Anatomy US nl female   Prenatal Transfer Tool  Maternal Diabetes: No Genetic  Screening: Declined Maternal Ultrasounds/Referrals: Normal Fetal Ultrasounds or other Referrals:  None Maternal Substance Abuse:  No Significant Maternal Medications:  None Significant Maternal Lab Results: Lab values include: Group B Strep positive  No results found for this or any previous visit (from the past 24 hour(s)).  Patient Active Problem List   Diagnosis Date Noted  . Active labor at term 12/01/2015  . GBS (group B Streptococcus carrier), +RV culture, currently pregnant 11/22/2015  . Supervision of normal pregnancy in first trimester 05/28/2015  . History of cesarean delivery affecting pregnancy 10/26/2014  . Anxiety 02/19/2012  . Depression 09/11/2011  . 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 [id number] at [redacted]w[redacted]d here for SOL.    # Labor: pt in early active labor  -admit to L&D with routine orders   #FWB: Category 1 -Continue monitoring   # Pain Control: no epidural desired -analgesia prn  # GBS: positive -ampicillin  # Postpartum plan:  -Feeding: breast -Contraception: ?vasectomy  Wynne Dust, MD, PGY-1  OB   CNM attestation:  I have seen and examined this patient; I agree with above documentation in the resident's note.   Vanessa Larson is a 35 y.o. [id number] here for SOL  PE: BP 126/86 mmHg  Pulse 93  Temp(Src) 99.1 F (37.3 C) (Oral)  Resp 16  Ht  (1.651 m)  Wt 83.462 kg (184 lb)  BMI 30.62 kg/m2  LMP 01/17/2015  Resp: normal effort, no distress Abd: gravid  ROS, labs, PMH reviewed  Plan: Admit to [facility] Expectant management- TOLAC Amp for GBS ppx Anticipate SVD  SHAW, KIMBERLY CNM 12/01/2015, 11:03 PM

## 2015-12-02 ENCOUNTER — Encounter (HOSPITAL_COMMUNITY): Payer: Self-pay | Admitting: *Deleted

## 2015-12-02 DIAGNOSIS — B009 Herpesviral infection, unspecified: Secondary | ICD-10-CM

## 2015-12-02 DIAGNOSIS — O99824 Streptococcus B carrier state complicating childbirth: Secondary | ICD-10-CM

## 2015-12-02 DIAGNOSIS — O9852 Other viral diseases complicating childbirth: Secondary | ICD-10-CM

## 2015-12-02 DIAGNOSIS — Z3A39 39 weeks gestation of pregnancy: Secondary | ICD-10-CM

## 2015-12-02 DIAGNOSIS — O34211 Maternal care for low transverse scar from previous cesarean delivery: Secondary | ICD-10-CM

## 2015-12-02 MED ORDER — LANOLIN HYDROUS EX OINT: TOPICAL_OINTMENT | CUTANEOUS | Status: DC | PRN

## 2015-12-02 MED ORDER — ACETAMINOPHEN 325 MG PO TABS
650.0000 mg | ORAL_TABLET | ORAL | Status: DC | PRN
  Administered 2015-12-03: 650 mg via ORAL
  Filled 2015-12-02: qty 2

## 2015-12-02 MED ORDER — DIPHENHYDRAMINE HCL 25 MG PO CAPS: 25.0000 mg | ORAL_CAPSULE | Freq: Four times a day (QID) | ORAL | Status: DC | PRN

## 2015-12-02 MED ORDER — BENZOCAINE-MENTHOL 20-0.5 % EX AERO
1.0000 "application " | INHALATION_SPRAY | CUTANEOUS | Status: DC | PRN
  Administered 2015-12-02: 1 via TOPICAL
  Filled 2015-12-02: qty 56

## 2015-12-02 MED ORDER — ONDANSETRON HCL 4 MG PO TABS
4.0000 mg | ORAL_TABLET | ORAL | Status: DC | PRN
Start: 2015-12-02 — End: 2015-12-04

## 2015-12-02 MED ORDER — SIMETHICONE 80 MG PO CHEW
80.0000 mg | CHEWABLE_TABLET | ORAL | Status: DC | PRN
Start: 2015-12-02 — End: 2015-12-04

## 2015-12-02 MED ORDER — IBUPROFEN 600 MG PO TABS
600.0000 mg | ORAL_TABLET | Freq: Four times a day (QID) | ORAL | Status: DC
  Administered 2015-12-02 – 2015-12-04 (×8): 600 mg via ORAL
  Filled 2015-12-02 (×9): qty 1

## 2015-12-02 MED ORDER — TETANUS-DIPHTH-ACELL PERTUSSIS 5-2.5-18.5 LF-MCG/0.5 IM SUSP: 0.5000 mL | Freq: Once | INTRAMUSCULAR | Status: DC

## 2015-12-02 MED ORDER — ZOLPIDEM TARTRATE 5 MG PO TABS: 5.0000 mg | ORAL_TABLET | Freq: Every evening | ORAL | Status: DC | PRN

## 2015-12-02 MED ORDER — WITCH HAZEL-GLYCERIN EX PADS: 1.0000 "application " | MEDICATED_PAD | CUTANEOUS | Status: DC | PRN

## 2015-12-02 MED ORDER — DIBUCAINE 1 % RE OINT: 1.0000 "application " | TOPICAL_OINTMENT | RECTAL | Status: DC | PRN

## 2015-12-02 MED ORDER — SENNOSIDES-DOCUSATE SODIUM 8.6-50 MG PO TABS
2.0000 | ORAL_TABLET | ORAL | Status: DC
  Administered 2015-12-02 – 2015-12-04 (×2): 2 via ORAL
  Filled 2015-12-02 (×2): qty 2

## 2015-12-02 MED ORDER — ONDANSETRON HCL 4 MG/2ML IJ SOLN
4.0000 mg | INTRAMUSCULAR | Status: DC | PRN
Start: 2015-12-02 — End: 2015-12-04

## 2015-12-02 MED ORDER — PRENATAL MULTIVITAMIN CH
1.0000 | ORAL_TABLET | Freq: Every day | ORAL | Status: DC
  Administered 2015-12-02 – 2015-12-04 (×3): 1 via ORAL
  Filled 2015-12-02 (×3): qty 1

## 2015-12-02 NOTE — Progress Notes (Signed)
Labor Progress Note Vanessa Larson is a 35 y.o. [id number] at [redacted]w[redacted]d presented for SOL. S: Pt tolerating frequent contractions in pain, declining meds.  Performed AROM  - green colored fluid.    O:  BP 114/75 mmHg  Pulse 90  Temp(Src) 99.1 F (37.3 C) (Oral)  Resp 16  Ht  (1.651 m)  Wt 83.462 kg (184 lb)  BMI 30.62 kg/m2  LMP 01/17/2015 EFM: 150s/mod/+accels  CVE: Dilation: Lip/rim Effacement (%): 100 Station: 0 Presentation: Vertex Exam by:: Zanovia Rotz, MD   A&P: 35 y.o. [id number] [redacted]w[redacted]d with SOL, now AROM.  #Labor: pt in active labor s/p AROM, continue expectant mgmt #Pain: pain meds declined #FWB: cat 1 #GBS: pos, received amp x2  Wynne Dust, MD 5:44 AM

## 2015-12-02 NOTE — Lactation Note (Signed)
This note was copied from the chart of Vanessa Larson. Lactation Consultation Note  Patient Name: Vanessa Larson [id number] Date: 12/02/2015 Reason for consult: Initial assessment Baby at 9 hr of life and mom was resting. She denies breast or nipple pain. Voiced no concerns. Stated this her 5th bf baby and he is latching well. She reports knowing how to manually express and having a DEBP at home. Given lactation handouts. Aware of OP services and support group.    Maternal Data Has patient been taught Hand Expression?: Yes Does the patient have breastfeeding experience prior to this delivery?: Yes  Feeding Feeding Type: Breast Fed Length of feed: 30 min  LATCH Score/Interventions Latch:  (instructed mother to call with next latch)                    Lactation Tools Discussed/Used     Consult Status Consult Status: Follow-up Date: 12/03/15 Follow-up type: In-patient    Rulon Eisenmenger 12/02/2015, 5:10 PM

## 2015-12-03 LAB — RPR: RPR Ser Ql: NONREACTIVE

## 2015-12-03 NOTE — Progress Notes (Signed)
Post Partum Day 1 Subjective: no complaints, up ad lib, voiding and tolerating PO, small lochia, plans to breastfeed, vasectomy  Objective: Blood pressure 109/55, pulse 91, temperature 98.1 F (36.7 C), temperature source Oral, resp. rate 18, height  (1.651 m), weight 83.462 kg (184 lb), last menstrual period 01/17/2015, SpO2 97 %, unknown if currently breastfeeding.  Physical Exam:  General: alert, cooperative and no distress Lochia:normal flow Chest: CTAB Heart: RRR no m/r/g Abdomen: +BS, soft, nontender,  Uterine Fundus: firm DVT Evaluation: No evidence of DVT seen on physical exam. Extremities: trace edema   Recent Labs  12/01/15 2145  HGB 11.2*  HCT 34.3*    Assessment/Plan: Plan for discharge tomorrow   LOS: 2 days   CRESENZO-DISHMAN,Kwane Rohl 12/03/2015, 7:32 AM

## 2015-12-03 NOTE — Progress Notes (Signed)

## 2015-12-03 NOTE — Lactation Note (Signed)
This note was copied from the chart of Vanessa Lanisha Stepanian. Lactation Consultation Note  Experienced BF mother reports that BF is going well.  Weight and output are well within normal conditions.  She denies any questions or concerns. Patient Name: Vanessa Larson [id number] Date: 12/03/2015     Maternal Data    Feeding Feeding Type: Breast Fed Length of feed: 5 min  LATCH Score/Interventions                      Lactation Tools Discussed/Used     Consult Status      Soyla Dryer 12/03/2015, 5:12 PM

## 2015-12-04 MED ORDER — IBUPROFEN 600 MG PO TABS: 600.0000 mg | ORAL_TABLET | Freq: Four times a day (QID) | ORAL | Status: DC

## 2015-12-04 NOTE — Progress Notes (Signed)
POSTPARTUM PROGRESS NOTE  Post Partum Day 2 Subjective:  Vanessa Larson is a 35 y.o. [id number] [redacted]w[redacted]d s/p VBAC with 2 prior VBAC and GBS+ with adequate ampicillin treatment.  No acute events overnight.  Pt denies problems with ambulating, voiding or po intake.  She denies nausea or vomiting.  Pain is well controlled on ibuprogen and limited to cramping during breastfeeding.  She has had flatus. She has not had bowel movement.  Lochia Small. Breastfeeding well. Is planning to restart bupropion for anxiety and depression. Struggled significantly with anxiety after the birth of her last child.   Objective: Blood pressure 106/80, pulse 91, temperature 97.9 F (36.6 C), temperature source Oral, resp. rate 18, height  (1.651 m), weight 83.462 kg (184 lb), last menstrual period 01/17/2015, SpO2 97 %, unknown if currently breastfeeding.  Physical Exam:  General: alert, cooperative and no distress Chest: CTAB Heart: RRR, normal S1S2, no m/r/g Abdomen: +BS, soft, nontender,  Uterine Fundus: firm and palpable in pelvis Extremities: No pretibial edema, 2+ dorsalis pedis   Recent Labs  12/01/15 2145  HGB 11.2*  HCT 34.3*    Assessment/Plan:  ASSESSMENT: Vanessa Larson is a 35 y.o. [id number] [redacted]w[redacted]d s/p  VBAC with 2 prior VBAC and GBS+ with adequate ampicillin treatment.  - Discharge home - Breastfeeding - Circumcision this AM - Contraception: She and husband considering vasectomy. Thinking they are ready to be done with children and has disliked prior hormonal methods.   LOS: 3 days   Ann Maki 12/04/2015, 9:18 AM   I have seen and examined this patient and I agree with the above. See Discharge Summary. Cam Hai 12:32 AM 12/11/2015

## 2015-12-04 NOTE — Lactation Note (Signed)
This note was copied from the chart of Vanessa Larson. Lactation Consultation Note  Patient Name: Vanessa Larson [id number] Date: 12/04/2015  baby 70 hours old , doc flow sheets WNL for D/C . Bili check elevated. See doc flow sheets.  Per mom breast feeding well.  Baby recently breast fed and this mom a experienced breast feeder. Mom denies soreness. Sore nipple and engorgement prevention and tx reviewed.  Per mom has DEBP at home.  Mother informed of post-discharge support and given phone number to the lactation department, including services for phone call assistance; out-patient appointments; and breastfeeding support group. List of other breastfeeding resources in the community given in the handout. Encouraged mother to call for problems or concerns related to breastfeeding.   Maternal Data    Feeding Feeding Type: Breast Fed Length of feed: 8 min  LATCH Score/Interventions Latch: Grasps breast easily, tongue down, lips flanged, rhythmical sucking.  Audible Swallowing: Spontaneous and intermittent  Type of Nipple: Everted at rest and after stimulation  Comfort (Breast/Nipple): Soft / non-tender     Hold (Positioning): No assistance needed to correctly position infant at breast.  LATCH Score: 10  Lactation Tools Discussed/Used     Consult Status      Vanessa Larson 12/04/2015, 9:45 AM

## 2015-12-04 NOTE — Discharge Instructions (Signed)

## 2015-12-06 ENCOUNTER — Encounter: Payer: BLUE CROSS/BLUE SHIELD | Admitting: Advanced Practice Midwife

## 2015-12-11 NOTE — Discharge Summary (Signed)
OB Discharge Summary     Patient Name: Vanessa Larson DOB: 29-Apr-1981 MRN: [id number]  Date of admission: 12/01/2015 Delivering MD: Wynne Dust   Date of discharge: 12/11/2015  Admitting diagnosis: 39w labor Intrauterine pregnancy: 109w2d     Secondary diagnosis:  Active Problems:   Active labor at term  Additional problems: Prev C/S w/ two successful VBAC deliveries     Discharge diagnosis: Term Pregnancy Delivered                                                                                                Post partum procedures:none  Augmentation: AROM  Complications: None  Hospital course:  Onset of Labor With Vaginal Delivery     35 y.o. yo [id number] at [redacted]w[redacted]d was admitted in Active Labor on 12/01/2015. Patient had an uncomplicated labor course as follows:  Membrane Rupture Time/Date: 5:36 AM ,12/02/2015   Intrapartum Procedures: Episiotomy: None [1]                                         Lacerations:  2nd degree [3];Perineal [11]  Patient had a delivery of a Viable infant. 12/02/2015  Information for the patient's newborn:  Taylan, Mayhan [[id number]]  Delivery Method: VBAC, Spontaneous (Filed from Delivery Summary)    Pateint had an uncomplicated postpartum course.  She is ambulating, tolerating a regular diet, passing flatus, and urinating well. Patient is discharged home in stable condition on 12/11/2015.    Physical exam  Filed Vitals:   12/02/15 1749 12/03/15 0704 12/03/15 1723 12/04/15 0609  BP: 106/64 109/55 113/53 106/80  Pulse: 99 91 93 91  Temp: 98.5 F (36.9 C) 98.1 F (36.7 C) 98.5 F (36.9 C) 97.9 F (36.6 C)  TempSrc: Oral Oral Oral Oral  Resp: Height:      Weight:      SpO2:       General: alert and cooperative Lochia: appropriate Uterine Fundus: firm Incision: N/A DVT Evaluation: No evidence of DVT seen on physical exam. Labs: Lab Results  Component Value Date   WBC 12.9* 12/01/2015   HGB 11.2* 12/01/2015   HCT  34.3* 12/01/2015   MCV 83.9 12/01/2015   PLT 176 12/01/2015   CMP Latest Ref Rng 09/13/2015  Glucose 65 - 99 mg/dL 79  BUN 7 - 25 mg/dL 6(L)  Creatinine 6.96 - 1.10 mg/dL 2.95(M)  Sodium 841 - 324 mmol/L 138  Potassium 3.5 - 5.3 mmol/L 3.6  Chloride 98 - 110 mmol/L 105  CO2 20 - 31 mmol/L 21  Calcium 8.6 - 10.2 mg/dL 8.3(L)  Total Protein 6.1 - 8.1 g/dL 6.1  Total Bilirubin 0.2 - 1.2 mg/dL 0.4  Alkaline Phos 33 - 115 U/L 79  AST 10 - 30 U/L 13  ALT 6 - 29 U/L 11    Discharge instruction: per After Visit Summary and "Baby and Me Booklet".  After visSHIA DELAINEMedication List    STOP taking these medications  valACYclovir 1000 MG tablet  Commonly known as:  VALTREX      TAKE these medications        buPROPion 150 MG 12 hr tablet  Commonly known as:  WELLBUTRIN SR  Take 1 tablet (150 mg total) by mouth 2 (two) times daily.     ibuprofen 600 MG tablet  Commonly known as:  ADVIL,MOTRIN  Take 1 tablet (600 mg total) by mouth 4 (four) times daily.     PRENATAL VITAMINS PO  Take 1 tablet by mouth daily.        Diet: routine diet  Activity: Advance as tolerated. Pelvic rest for 6 weeks.   Outpatient follow up:6 weeks Follow up Appt:No future appointments. Follow up Visit:No Follow-up on file.  Postpartum contraception: Vasectomy  Newborn Data: Live born female  Birth Weight: 9 lb 6.1 oz (4255 g) APGAR: 9, 9  Baby Feeding: Breast Disposition:home with mother   12/11/2015 Cam Hai, CNM

## 2015-12-13 ENCOUNTER — Encounter: Payer: Self-pay | Admitting: Obstetrics & Gynecology

## 2015-12-13 ENCOUNTER — Ambulatory Visit (INDEPENDENT_AMBULATORY_CARE_PROVIDER_SITE_OTHER): Payer: BLUE CROSS/BLUE SHIELD | Admitting: Obstetrics & Gynecology

## 2015-12-13 NOTE — Progress Notes (Signed)
   Subjective:    Patient ID: Vanessa Larson, female    DOB: 1981-10-01, 35 y.o.   MRN: [id number]  HPI  Pt 10 days pp and c/o vaginal and abdominal pressure and things falling out.  She did pass 2 clots as well.  No vaginal discharge.  No fever.  No depression.  Review of Systems  Constitutional: Negative.   Respiratory: Negative.   Cardiovascular: Negative.   Gastrointestinal: Negative for nausea, vomiting, diarrhea, constipation and rectal pain.  Genitourinary: Positive for vaginal bleeding and pelvic pain. Negative for frequency and flank pain.  Psychiatric/Behavioral: Negative.        Objective:   Physical Exam  Constitutional: She is oriented to person, place, and time. She appears well-developed and well-nourished. No distress.  HENT:  Head: Normocephalic and atraumatic.  Eyes: Conjunctivae are normal.  Pulmonary/Chest: Effort normal.  Abdominal: Soft. Bowel sounds are normal. She exhibits no distension and no mass. There is no tenderness. There is no rebound and no guarding.  Genitourinary:  Perineum--intact, stitches felt, no evidence of infection Vagina--no odor, small amt of blood Uterus--nontender, sightly enlarged but appropriate for pp state. Adnexa--nontender no mass  Musculoskeletal: She exhibits no edema.  Neurological: She is alert and oriented to person, place, and time.  Skin: Skin is warm and dry.  Psychiatric: She has a normal mood and affect.  Vitals reviewed.         Assessment & Plan:  35 yo female with normal pp state  Pt reassured.  NO evidence of infection or retained POC Vag rest for 6 weeks pp [facility] for short baths.

## 2015-12-24 ENCOUNTER — Encounter: Payer: Self-pay | Admitting: Family

## 2015-12-24 ENCOUNTER — Ambulatory Visit (INDEPENDENT_AMBULATORY_CARE_PROVIDER_SITE_OTHER): Payer: BLUE CROSS/BLUE SHIELD | Admitting: Family

## 2015-12-24 VITALS — BP 112/69 | HR 103 | Temp 98.9°F | Resp 16 | Ht 65.0 in | Wt 161.0 lb

## 2015-12-24 DIAGNOSIS — N61 Mastitis without abscess: Secondary | ICD-10-CM | POA: Diagnosis not present

## 2015-12-24 DIAGNOSIS — O9122 Nonpurulent mastitis associated with the puerperium: Secondary | ICD-10-CM | POA: Insufficient documentation

## 2015-12-24 MED ORDER — FLUCONAZOLE 150 MG PO TABS: 150.0000 mg | ORAL_TABLET | Freq: Every day | ORAL | Status: DC

## 2015-12-24 MED ORDER — CEPHALEXIN 500 MG PO CAPS: 500.0000 mg | ORAL_CAPSULE | Freq: Four times a day (QID) | ORAL | Status: DC

## 2015-12-24 MED ORDER — NYSTATIN 100000 UNIT/GM EX CREA: 1.0000 "application " | TOPICAL_CREAM | Freq: Two times a day (BID) | CUTANEOUS | Status: DC

## 2015-12-24 NOTE — Progress Notes (Signed)
   Subjective:    Patient ID: Vanessa Larson, female    DOB: 1981/07/29, 35 y.o.   MRN: [id number]  HPI [name] is a [id number] here with report of report of right breast pain and a small mass that started yesterday.  Patient is 2.5 weeks postpartum and lactating.  +fever 100.1 yesterday.  Taking ibuprofen for fever and chills.  No other symptoms present.     Review of Systems  Constitutional: Positive for fever and chills.  HENT: Negative for congestion.   Respiratory: Negative for cough.   Gastrointestinal: Negative for nausea and vomiting.  Musculoskeletal:       Right breast pain and small mass       Objective:   Physical Exam  Constitutional: She is oriented to person, place, and time. She appears well-developed and well-nourished. No distress.  HENT:  Head: Normocephalic and atraumatic.  Neck: Normal range of motion. Neck supple. No thyromegaly present.  Cardiovascular: Normal rate and regular rhythm.   Pulmonary/Chest:    Abdominal: Bowel sounds are normal.  Neurological: She is alert and oriented to person, place, and time.  Skin: Skin is warm and dry.          Assessment & Plan:  Right breast mastitis  Plan: RX Keflex QID x 7 days Increase rest and fluids Ibuprofen for fever and pain Increase breastfeeding on affected side Follow-up in 48hours  Omari Mcmanaway Kennith Gain, CNM

## 2016-01-05 IMAGING — US US TRANSVAGINAL NON-OB
1 series · 14 of 25 positions shown · non-contrast
Comparison: Ultrasound 12/01/2014

CLINICAL DATA: Increased heavy vaginal bleeding with clots status
post missed abortion.

EXAM:
ULTRASOUND PELVIS TRANSVAGINAL
TECHNIQUE: Transvaginal ultrasound examination of the pelvis was performed
including evaluation of the uterus, ovaries, adnexal regions, and
pelvic cul-de-sac.

[Series 1: us transvaginal non-ob · 14 of 31 slices shown]
[im 1/31]
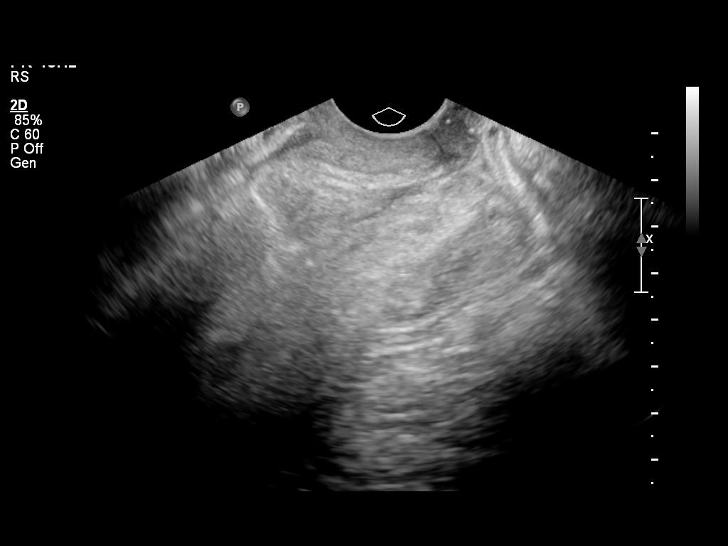
[im 3/31]
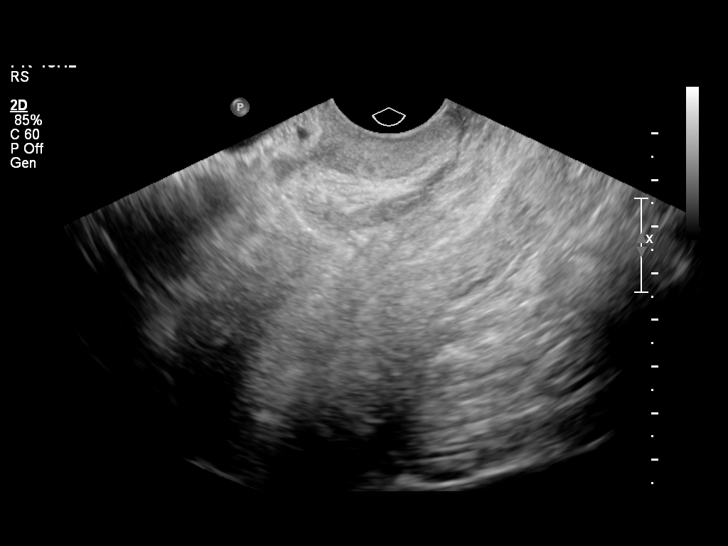
[im 6/31]
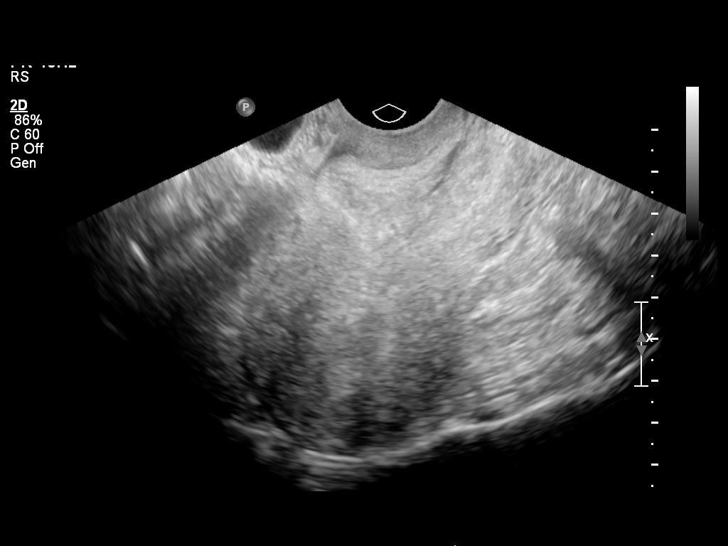
[im 8/31]
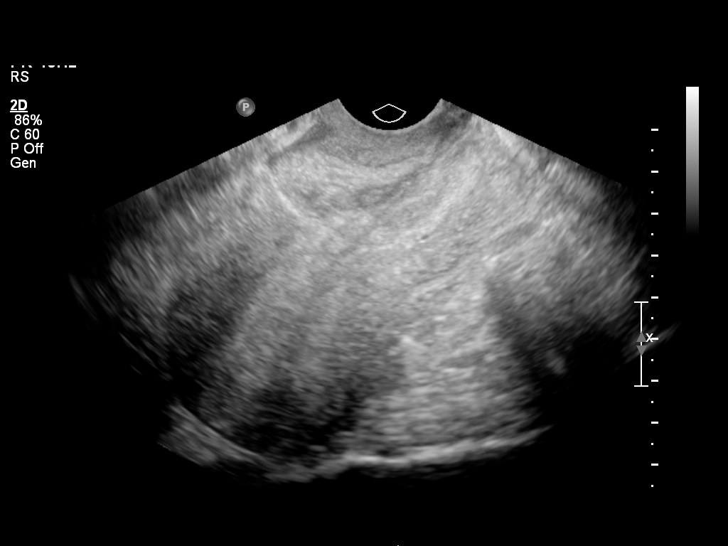
[im 11/31]
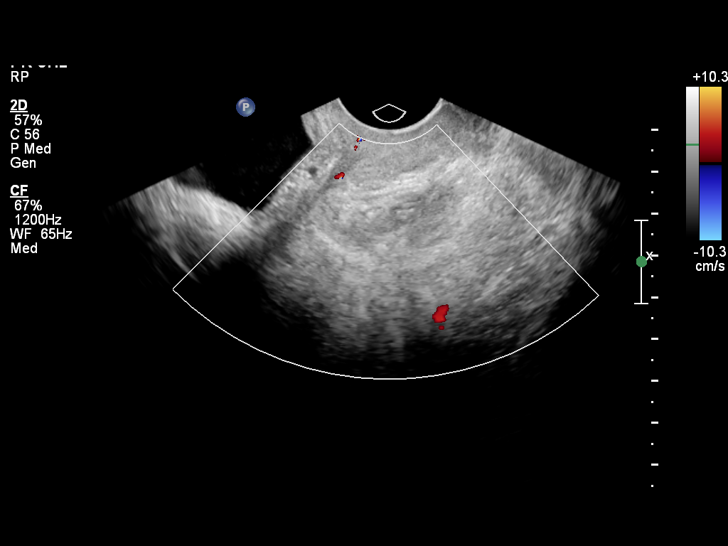
[im 12/31]
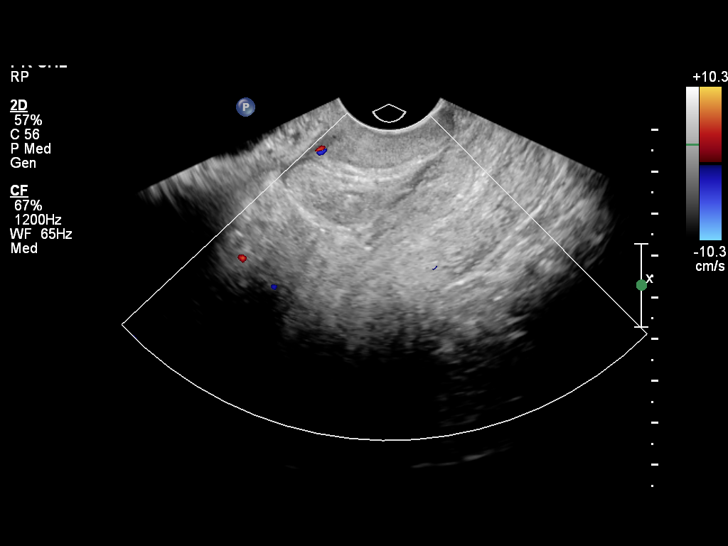
[im 14/31]
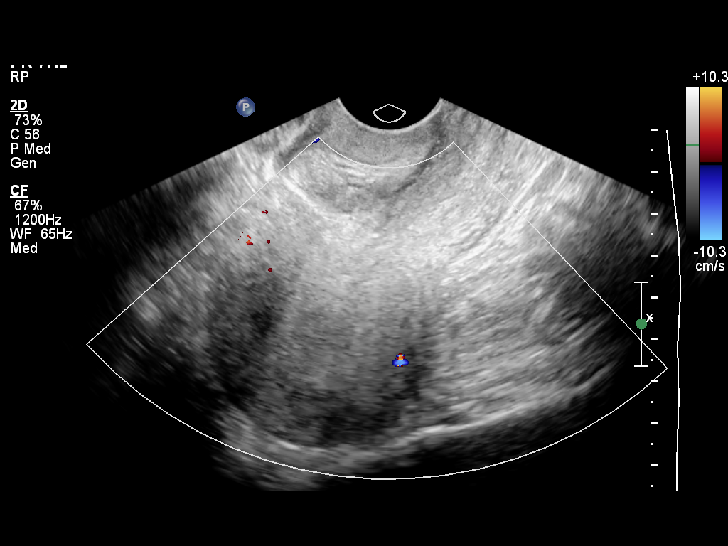
[im 17/31]
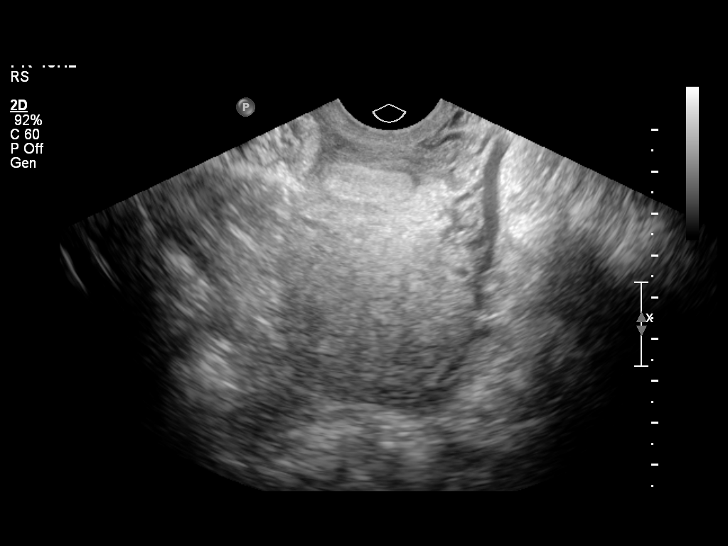
[im 19/31]
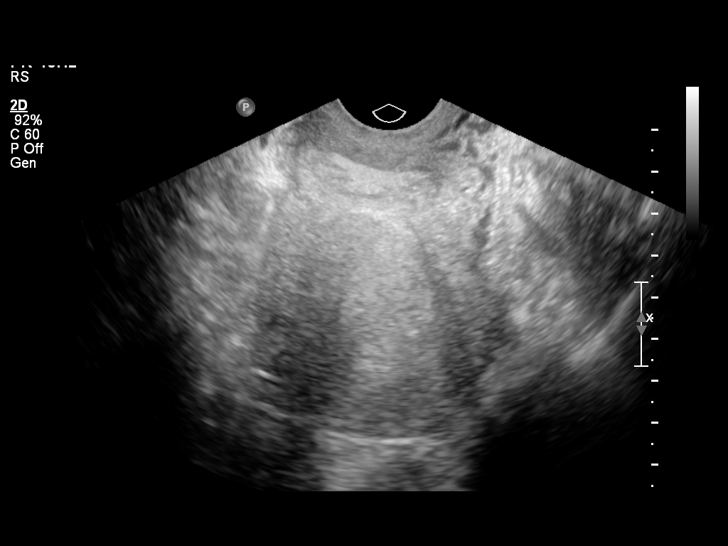
[im 21/31]
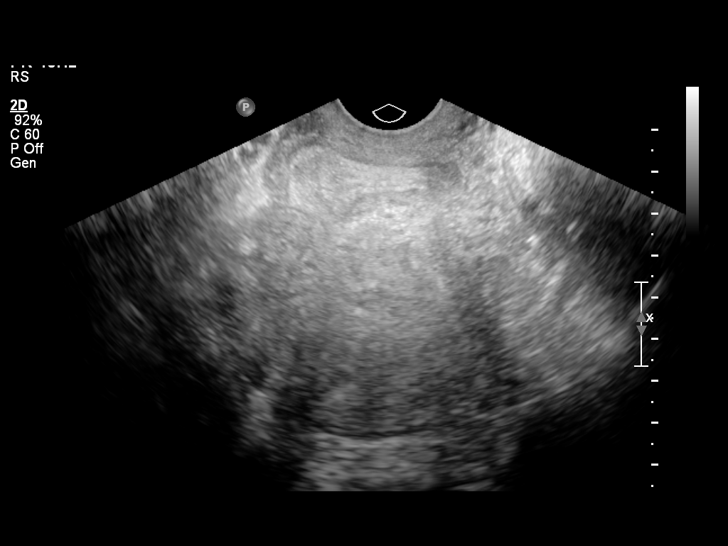
[im 23/31]
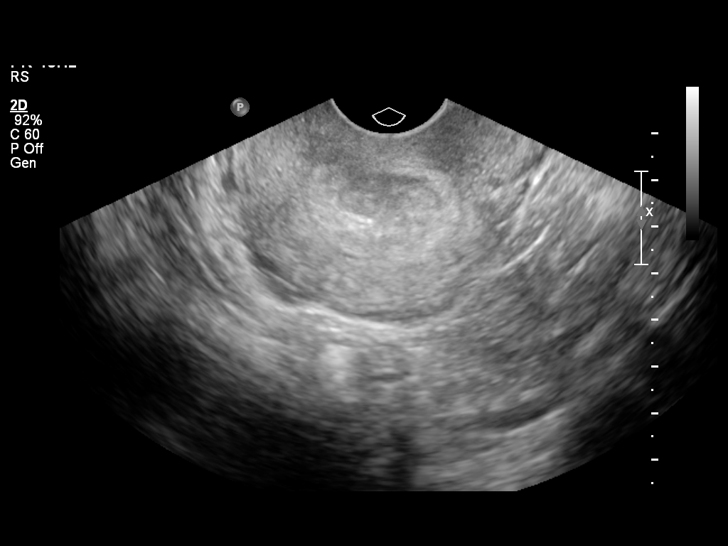
[im 26/31]
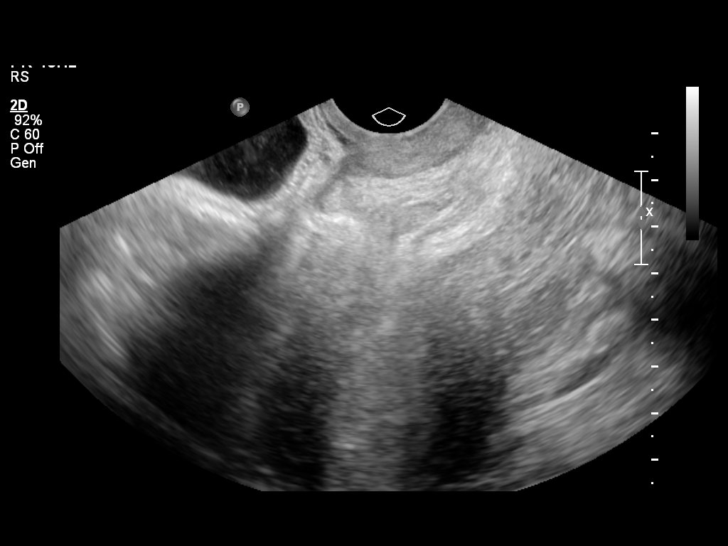
[im 28/31]
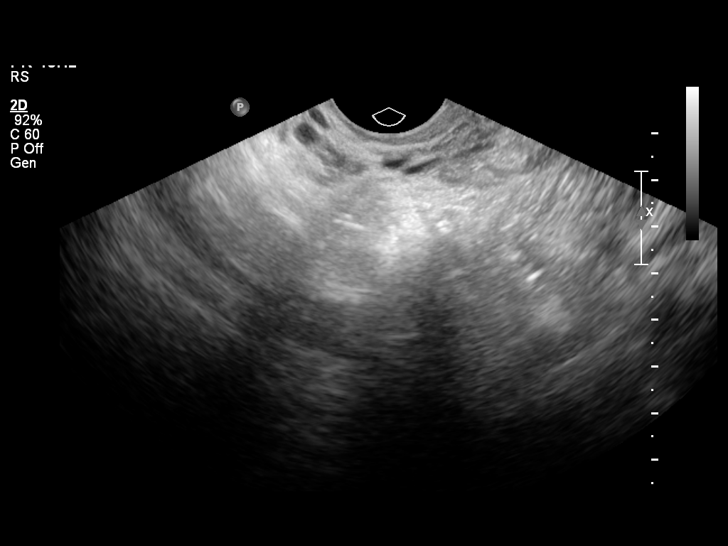
[im 31/31]
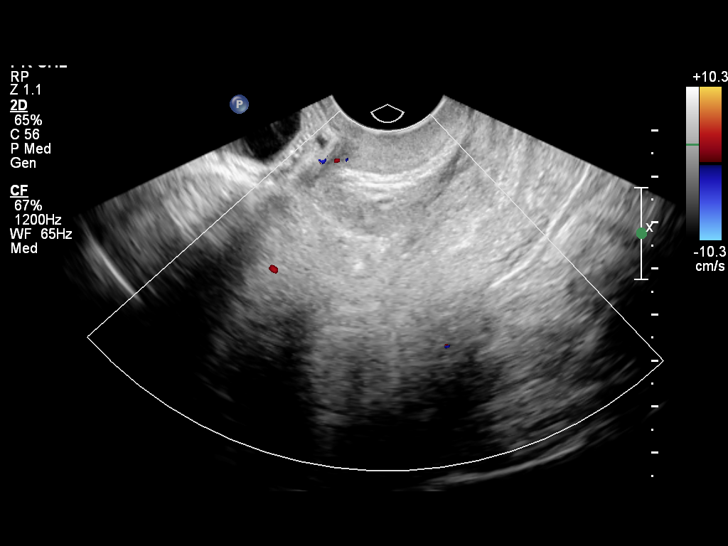

[14 of 25 positions shown; findings below may reference images not displayed]

FINDINGS: Uterus

Measurements: 9.6 x 4.7 x 5.8 cm. No fibroids or other mass
visualized.

Endometrium

Thickness: Remains thickened measuring 2.2 cm with heterogeneous
echotexture. No definite associated fluid. There is no hyperemia to
suggest retained products of conception..

Right ovary

Measurements: 3.0 x 1.7 x 1.6 cm. Normal appearance/no adnexal mass.

Left ovary

Not well seen currently.

Other findings:  No free fluid
IMPRESSION: Thickened heterogeneous endometrium. There is no abnormal
vascularity to suggest retained products of conception.

## 2016-01-13 ENCOUNTER — Ambulatory Visit (INDEPENDENT_AMBULATORY_CARE_PROVIDER_SITE_OTHER): Payer: BLUE CROSS/BLUE SHIELD | Admitting: Obstetrics & Gynecology

## 2016-01-13 ENCOUNTER — Encounter: Payer: Self-pay | Admitting: Obstetrics & Gynecology

## 2016-01-13 NOTE — Progress Notes (Signed)
Patient ID: Vanessa HartshornMolly M Larson, female   DOB: May 10, 1981, 35 y.o.   MRN: [id number] Subjective:     Vanessa Larson is a 35 y.o. female who presents for a postpartum visit. She is 6  weeks postpartum following a  (VBAC) spontaneous vaginal delivery. I have fully reviewed the prenatal and intrapartum course. The delivery was at 39 gestational weeks. Outcome: liveborn female  Samuel BoucheLucas vaginal birth after cesarean (VBAC). Anesthesia:none none. Postpartum course has been uncomplicated, small amount of bleeding  every day.  Some staining.  Sometimes a gush.  No clots. Baby's course has been well. Baby is feeding by breast breast. Bleeding small amount, 2-3 panty liners a daystaining only. Bowel function is normal normal. Bladder function is normal. Patientis not is not sexually active. Contraception method is barrier, husband is considering vasectomy  . Postpartum depression screening: 7.  Pateint is suffering from anxiety and has appt with CBT.    The following portions of the patient's history were reviewed and updated as appropriate: allergies, current medications, past family history, past medical history, past social history, past surgical history and problem list.  Review of Systems Pertinent items noted in HPI and remainder of comprehensive ROS otherwise negative.   Objective:    BP 126/88 mmHg  Pulse 82  Ht 5\' 6"  (1.676 m)  Wt 161 lb (73.029 kg)  BMI 26.00 kg/m2  General:  alert, cooperative and no distress   Breasts:  inspection negative, no nipple discharge or bleeding, no masses or nodularity palpable  Lungs: clear to auscultation bilaterally  Heart:  regular rate and rhythm  Abdomen: soft, non-tender; bowel sounds normal; no masses,  no organomegaly   Vulva:  normal  Vagina: normal vagina  Cervix:  small amount of blood from edge of cervix.  Corpus: normal size, contour, position, consistency, mobility, non-tender  Adnexa:  normal adnexa  Rectal Exam: Not performed.        Assessment:    Nml postpartum exam.  If pt continues to bleed 2 more weeks, will obtain TVUS.  Pt will all if she is bleeding F/U with CBT and continue wellbutrin.  Plan:    1. Contraception: condoms 2.. Follow up in: 1 year or as needed.   3.  RN will investigate when last pap was (don't see one in computer).  If due, then RN will call patient and schedule.

## 2016-02-17 ENCOUNTER — Other Ambulatory Visit: Payer: Self-pay | Admitting: Advanced Practice Midwife

## 2016-05-04 DIAGNOSIS — K810 Acute cholecystitis: Secondary | ICD-10-CM | POA: Insufficient documentation

## 2016-05-15 IMAGING — US US OB COMP LESS 14 WK
1 series · 14 of 28 positions shown · non-contrast
Comparison: 12/06/2014

CLINICAL DATA: Positive pregnancy test. Unsure of LMP. Recent
miscarriage approximately 4 months ago.

EXAM:
OBSTETRIC <14 WK US AND TRANSVAGINAL OB US
TECHNIQUE: Both transabdominal and transvaginal ultrasound examinations were
performed for complete evaluation of the gestation as well as the
maternal uterus, adnexal regions, and pelvic cul-de-sac.
Transvaginal technique was performed to assess early pregnancy.

[Series 1: us ob comp less 14 wk · 0.17mm/px · 14 of 182 slices shown]
[im 7/182]
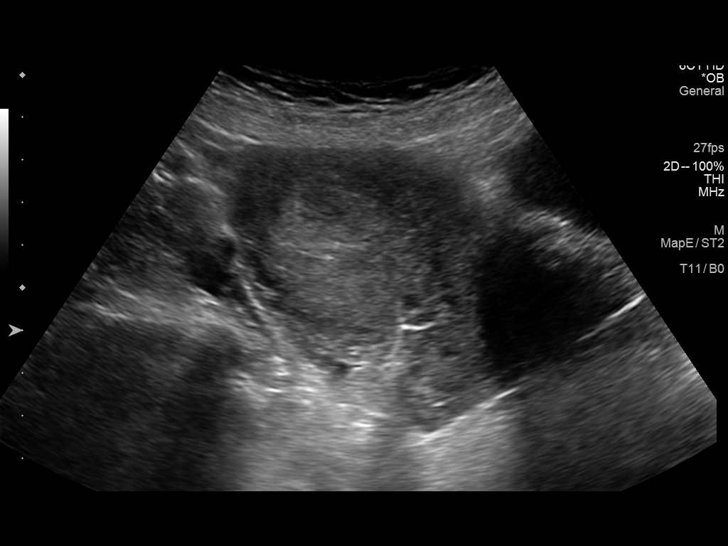
[im 21/182]
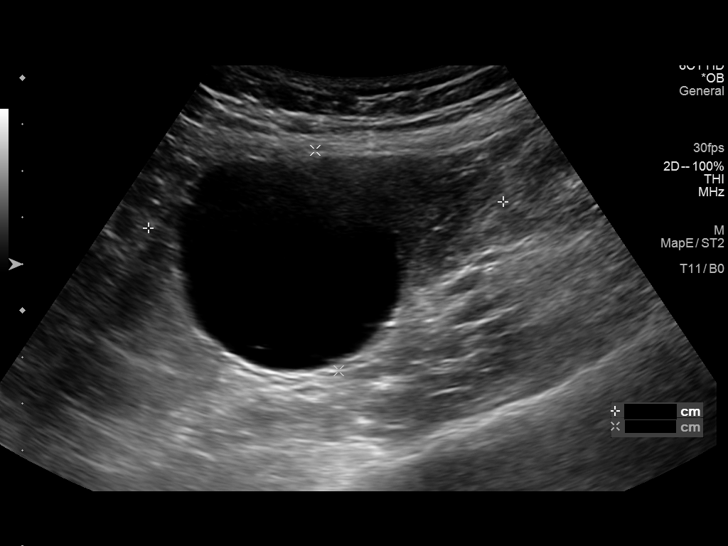
[im 34/182]
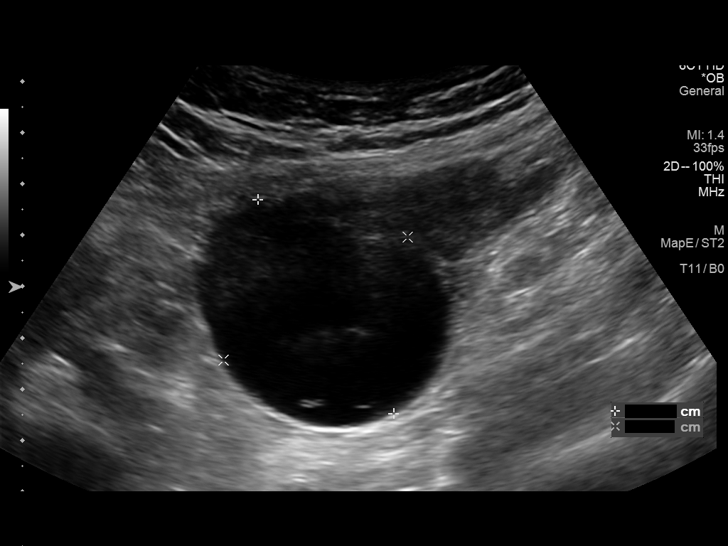
[im 47/182]
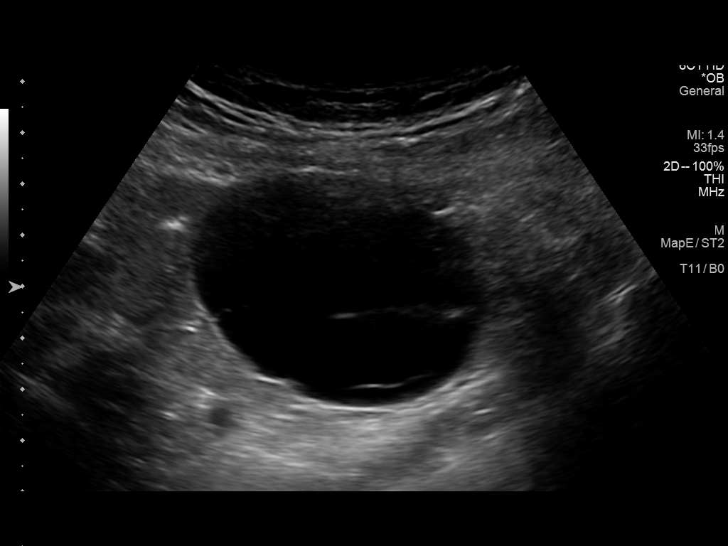
[im 61/182]
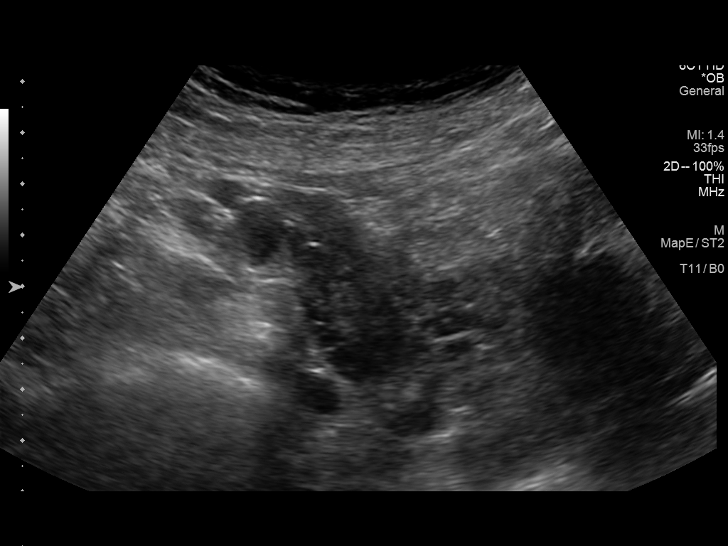
[im 74/182]
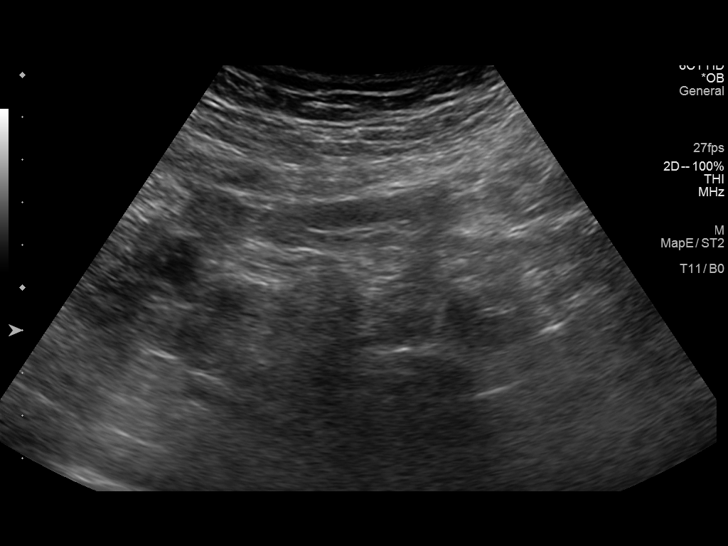
[im 88/182]
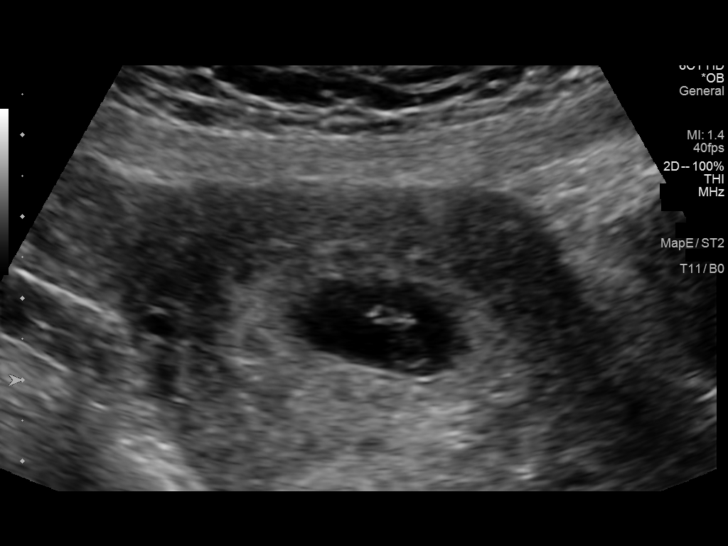
[im 101/182]
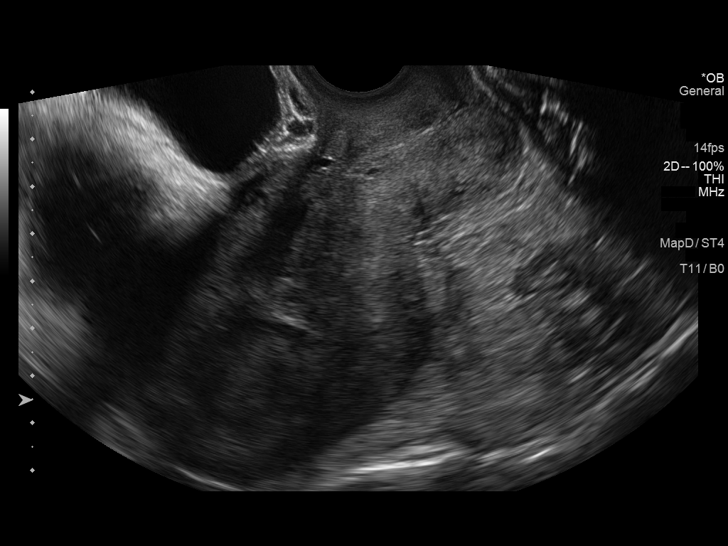
[im 114/182]
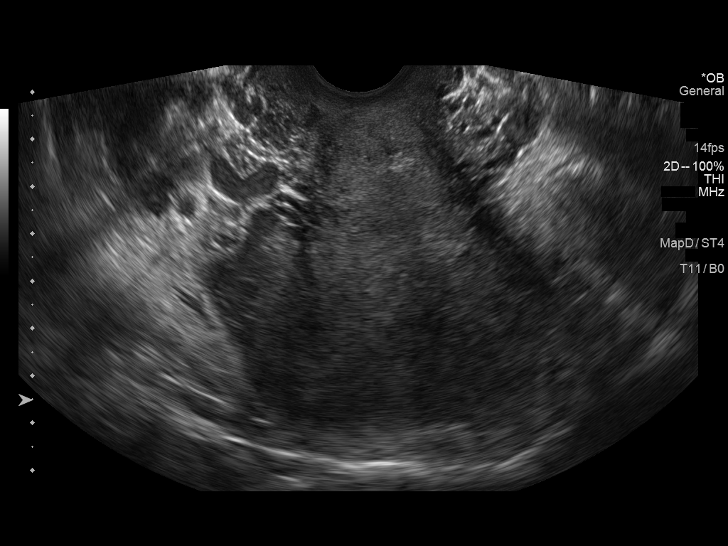
[im 128/182]
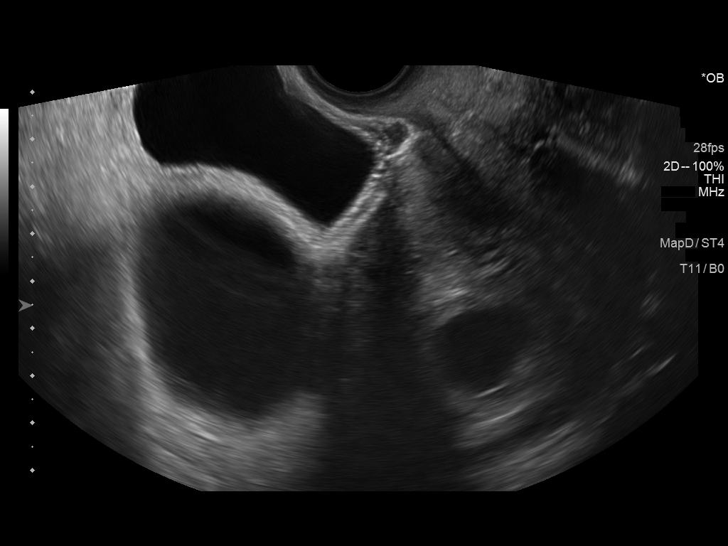
[im 141/182]
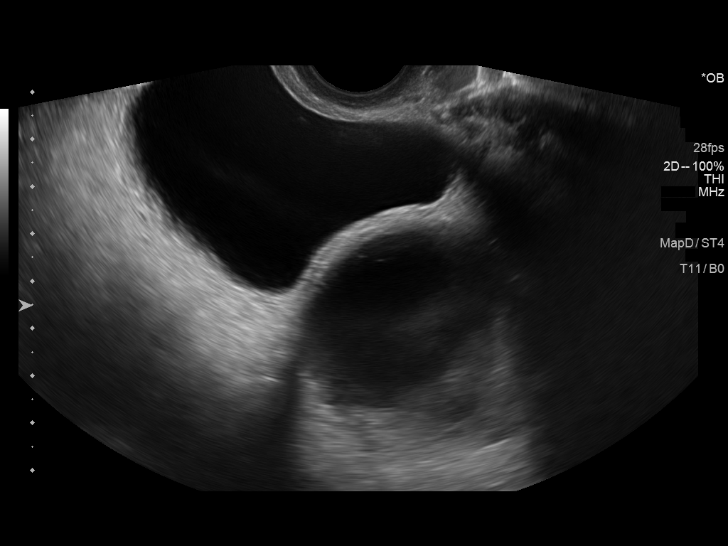
[im 155/182]
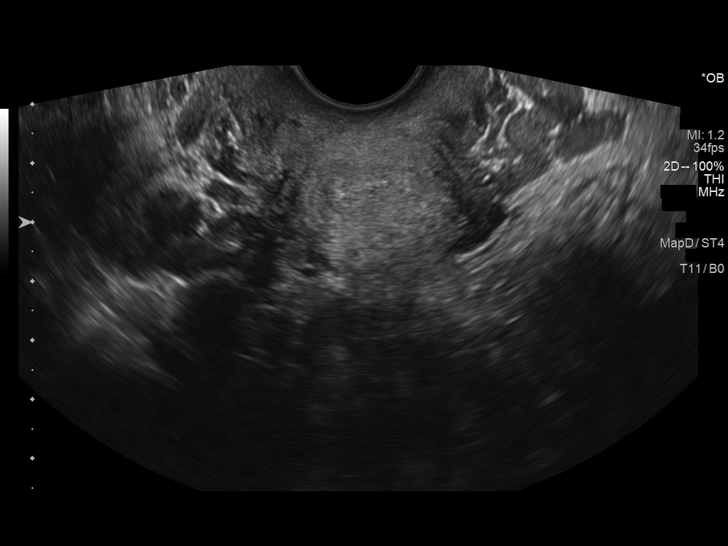
[im 168/182]
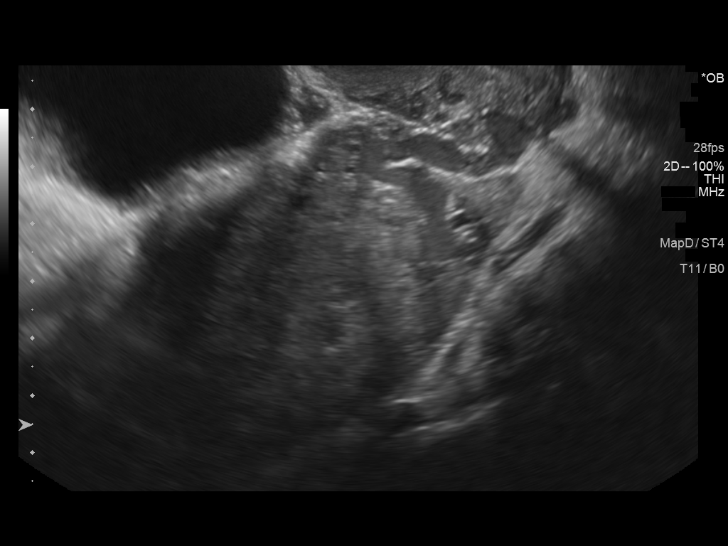
[im 182/182]
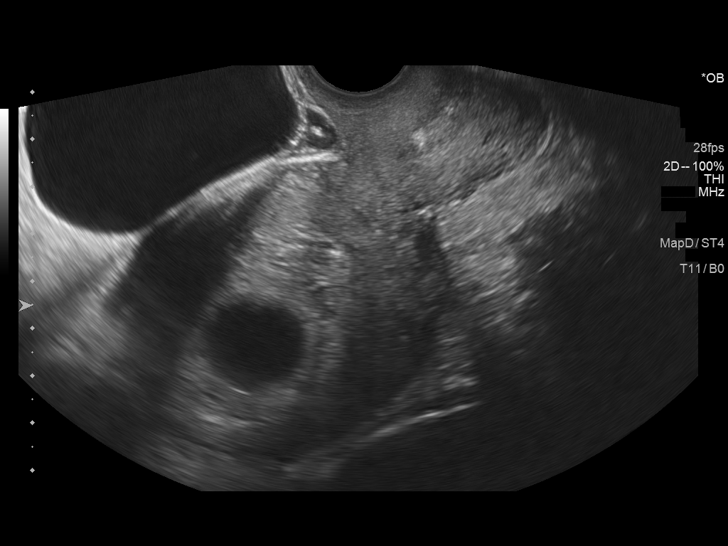

[14 of 28 positions shown; findings below may reference images not displayed]

FINDINGS: Intrauterine gestational sac: Visualized/normal in shape.

Yolk sac:  Visualized

Embryo:  Visualized

Cardiac Activity: Visualized

Heart Rate: 121  bpm

CRL:  6  mm   6 w   3 d                  US EDC: 12/07/2015

Maternal uterus/adnexae: Right ovary is normal in appearance. A left
ovarian cyst is seen which contains multiple thin internal
septations. This measures 4.9 x 4.3 x 5.6 cm. This has indeterminate
but probably benign characteristics. No free fluid identified.
IMPRESSION: Single living IUP measuring 6 weeks 3 days with US EDC of
12/07/2015.

5.6 cm complex left ovarian cyst, with indeterminate but probably
benign characteristics. Continued followup by ultrasound is
recommended in 6 weeks.

## 2017-06-25 ENCOUNTER — Encounter: Payer: Self-pay | Admitting: Advanced Practice Midwife

## 2017-06-25 ENCOUNTER — Ambulatory Visit (INDEPENDENT_AMBULATORY_CARE_PROVIDER_SITE_OTHER): Payer: BLUE CROSS/BLUE SHIELD | Admitting: Advanced Practice Midwife

## 2017-06-25 VITALS — BP 117/68 | HR 73 | Resp 16 | Ht 65.0 in | Wt 162.0 lb

## 2017-06-25 DIAGNOSIS — Z01419 Encounter for gynecological examination (general) (routine) without abnormal findings: Secondary | ICD-10-CM

## 2017-06-25 DIAGNOSIS — E049 Nontoxic goiter, unspecified: Secondary | ICD-10-CM

## 2017-06-25 NOTE — Progress Notes (Signed)
GYNECOLOGY ANNUAL PREVENTATIVE CARE ENCOUNTER NOTE  Subjective:   Vanessa Larson is a 36 y.o. [id number] female here for a routine annual gynecologic exam.  Current complaints: some signs of possible hypothyroid, wants to be checked.  Had unprotected IC a week ago.  Using condoms for contraception.   Denies abnormal vaginal bleeding, discharge, pelvic pain, problems with intercourse or other gynecologic concerns.    Gynecologic History Patient's last menstrual period was 05/28/2017. Contraception: condoms Last mammogram: none.   Obstetric History OB History  Gravida Para Term Preterm AB Living  5 4 4   1 5   SAB TAB Ectopic Multiple Live Births  1     1 5     # Outcome Date GA Lbr Len/2nd Weight Sex Delivery Anes PTL Lv  5 Term 12/02/15 [redacted]w[redacted]d 17:10 / 00:21 9 lb 6.1 oz (4.255 kg) M VBAC Local  LIV     Birth Comments: WNL  4 SAB 12/06/14 [redacted]w[redacted]d         3 Term 01/27/13 [redacted]w[redacted]d 03:13 / 00:38 8 lb 11.7 oz (3.96 kg) M Vag-Spont None  LIV  2 Term 09/26/11 [redacted]w[redacted]d 72:21 / 00:33 8 lb 6.8 oz (3.822 kg) F Vag-Spont None  LIV     Birth Comments: none  1A Term 03/27/09 [redacted]w[redacted]d  6 lb 3 oz (2.807 kg) M CS-Unspec EPI N LIV  1B Term 03/27/09 [redacted]w[redacted]d  6 lb 7 oz (2.92 kg) M CS-Unspec EPI N LIV      Past Medical History:  Diagnosis Date  . Anxiety   . Depression   . H/O abnormal Pap smear   . Ovarian cyst affecting pregnancy in first trimester, antepartum 05/28/2015  . Pregnancy related condition in third trimester 2010   [redacted] weeks gestation complicated by depression/anxiety    Past Surgical History:  Procedure Laterality Date  . CESAREAN SECTION  2010   x 1  . DILATION AND EVACUATION N/A 12/06/2014   Procedure: DILATATION AND EVACUATION;  Surgeon: Reva Bores, MD;  Location: [facility];  Service: Gynecology;  Laterality: N/A;  . WISDOM TOOTH EXTRACTION      Current Outpatient Prescriptions on File Prior to Visit  Medication Sig Dispense Refill  . ibuprofen (ADVIL,MOTRIN) 600 MG tablet Take 1 tablet (600  mg total) by mouth 4 (four) times daily. 50 tablet 0  . Probiotic Product (PROBIOTIC DAILY PO) Take by mouth.     No current facility-administered medications on file prior to visit.     No Known Allergies  Social History   Social History  . Marital status: Married    Spouse name: N/A  . Number of children: N/A  . Years of education: N/A   Occupational History  . Not on file.   Social History Main Topics  . Smoking status: Never Smoker  . Smokeless tobacco: Never Used  . Alcohol use No  . Drug use: No     Comment: Marijuana three times in college  . Sexual activity: Yes    Partners: Male    Birth control/ protection: None     Comment: approx [redacted] wks gestation   Other Topics Concern  . Not on file   Social History Narrative  . No narrative on file    Family History  Problem Relation Age of Onset  . Depression Mother   . Stroke Maternal Grandmother   . Cancer Maternal Grandfather     The following portions of the patient's history were reviewed and updated as appropriate: allergies, current medications,  past family history, past medical history, past social history, past surgical history and problem list.  Review of Systems Pertinent items are noted in HPI.   Objective:  BP 117/68   Pulse 73   Resp 16   Ht 5\' 5"  (1.651 m)   Wt 162 lb (73.5 kg)   LMP 05/28/2017   BMI 26.96 kg/m  CONSTITUTIONAL: Well-developed, well-nourished female in no acute distress.  HENT:  Normocephalic, atraumatic, External right and left ear normal. Oropharynx is clear and moist EYES: . No scleral icterus.  NECK: Normal range of motion, supple, Left thyroid is slightly more prominent  SKIN: Skin is warm and dry. No rash noted. Not diaphoretic. No erythema. No pallor. NEUROLOGIC: Alert and oriented to person, place, and time. Normal reflexes, muscle tone coordination. No cranial nerve deficit noted. PSYCHIATRIC: Normal mood and affect. Normal behavior. Normal judgment and thought  content. CARDIOVASCULAR: Normal heart rate noted, regular rhythm RESPIRATORY: Clear to auscultation bilaterally. Effort and breath sounds normal, no problems with respiration noted. BREASTS: Symmetric in size. No masses, skin changes, nipple drainage, or lymphadenopathy. ABDOMEN: Soft, normal bowel sounds, no distention noted.  No tenderness, rebound or guarding.  PELVIC: Normal appearing external genitalia; normal appearing vaginal mucosa and cervix.  No abnormal discharge noted.  Pap smear obtained.  Normal uterine size, no other palpable masses, no uterine or adnexal tenderness. MUSCULOSKELETAL: Normal range of motion. No tenderness.  No cyanosis, clubbing, or edema.  2+ distal pulses.   Assessment:  Annual gynecologic examination with pap smear Possible thyroid enlargement with symptoms of feeling cold, tired    Plan:  Will follow up results of pap smear and manage accordingly Will draw Thyroid tests today Pap sent Recommend redo pregnancy test next week Routine preventative health maintenance measures emphasized. Please refer to After Visit Summary for other counseling recommendations.      Patient ID: Vanessa Larson, female   DOB: 09-Nov-1980, 36 y.o.   MRN: [id number]

## 2017-06-25 NOTE — Patient Instructions (Signed)

## 2017-06-26 ENCOUNTER — Telehealth: Payer: Self-pay | Admitting: *Deleted

## 2017-06-26 LAB — TSH: TSH: 1.31 m[IU]/L

## 2017-06-26 LAB — T4, FREE: Free T4: 1.1 ng/dL (ref 0.8–1.8)

## 2017-06-26 LAB — T3, FREE: T3 FREE: 3.3 pg/mL (ref 2.3–4.2)

## 2017-06-26 NOTE — Telephone Encounter (Signed)
Pt notified of normal Tsh, T3 and T4 results.

## 2017-06-27 LAB — CYTOLOGY - PAP
Diagnosis: NEGATIVE
HPV: NOT DETECTED

## 2017-07-09 ENCOUNTER — Encounter: Payer: Self-pay | Admitting: Obstetrics & Gynecology

## 2017-07-09 ENCOUNTER — Ambulatory Visit (INDEPENDENT_AMBULATORY_CARE_PROVIDER_SITE_OTHER): Payer: BLUE CROSS/BLUE SHIELD | Admitting: Obstetrics & Gynecology

## 2017-07-09 VITALS — BP 112/77 | HR 86 | Ht 66.0 in | Wt 162.0 lb

## 2017-07-09 DIAGNOSIS — N926 Irregular menstruation, unspecified: Secondary | ICD-10-CM | POA: Diagnosis not present

## 2017-07-09 DIAGNOSIS — Z3201 Encounter for pregnancy test, result positive: Secondary | ICD-10-CM

## 2017-07-09 LAB — POCT URINE PREGNANCY: PREG TEST UR: POSITIVE — AB

## 2017-07-09 NOTE — Progress Notes (Signed)
History:  36 y.o. [id number] here today for 'dark tinged vaginal discharge.'  LMP 05/29/2017. She reports that she is  2 weeks for ovulating. Pt denies frank bleeding or red discharge. Pt reports mild cramping severe. She reports that she is only [redacted] weeks pregnant.   The following portions of the patient's history were reviewed and updated as appropriate: allergies, current medications, past family history, past medical history, past social history, past surgical history and problem list.  Review of Systems:  Pertinent items are noted in HPI.   Objective:  Physical Exam Blood pressure 112/77, pulse 86, height 5\' 6"  (1.676 m), weight 162 lb (73.5 kg), last menstrual period 05/28/2017, currently breastfeeding.  CONSTITUTIONAL: Well-developed, well-nourished female in no acute distress.  HENT:  Normocephalic, atraumatic EYES: Conjunctivae and EOM are normal. No scleral icterus.  NECK: Normal range of motion SKIN: Skin is warm and dry. No rash noted. Not diaphoretic.No pallor. NEUROLGIC: Alert and oriented to person, place, and time. Normal coordination.   Assessment & Plan:  +UPT questionable bleeding in first trimester.   I explained ot her the risk of ectopic. She is only worried about a SAB.  Pt declines US Wants qHCG only Repeat qHCG in 48 hours  Total face-to-face time with patient was 15 min.  Greater than 50% was spent in counseling and coordination of care with the patient.   Harrel Ferrone L. Harraway-Smith, M.D., Evern CoreFACOG

## 2017-07-09 NOTE — Patient Instructions (Signed)

## 2017-07-10 ENCOUNTER — Telehealth: Payer: Self-pay

## 2017-07-10 LAB — HCG, QUANTITATIVE, PREGNANCY: HCG, TOTAL, QN: 235 m[IU]/mL

## 2017-07-10 NOTE — Telephone Encounter (Signed)
-----   Message from Willodean Rosenthalarolyn Harraway-Smith, MD sent at 07/10/2017 12:06 PM EDT ----- Please call pt. Her HCG is very low indicated a very early gestation. An US would not show a pregnancy until she is at least 2500.  If she has any bleeding she should f/u sooner prn  clh-S

## 2017-07-10 NOTE — Telephone Encounter (Signed)
Patient called and made aware that HCG level was 235. Patient made aware that this indicates very early gestation and us at this time would not show pregnancy until it reaches 2500. Patient made aware to contact the office back if bleeding. Patient will also repeat HCG tomorrow afternoon. Vanessa StammerJennifer Laurelin Elson RNBSN

## 2017-07-11 ENCOUNTER — Other Ambulatory Visit: Payer: BLUE CROSS/BLUE SHIELD

## 2017-07-11 DIAGNOSIS — Z3201 Encounter for pregnancy test, result positive: Secondary | ICD-10-CM

## 2017-07-11 NOTE — Progress Notes (Signed)
Patient presents for repeat HCG. Armandina StammerJennifer Stephie Xu RNBSN

## 2017-07-12 LAB — HCG, QUANTITATIVE, PREGNANCY: HCG, Total, QN: 710 m[IU]/mL

## 2017-07-16 ENCOUNTER — Telehealth: Payer: Self-pay | Admitting: *Deleted

## 2017-07-16 NOTE — Telephone Encounter (Signed)
Pt notified of BHCG levels and NOB appt made for 08/06/17

## 2017-07-16 NOTE — Telephone Encounter (Signed)
-----   Message from Willodean Rosenthal, MD sent at 07/12/2017  3:50 PM EDT ----- Please call pt. Her HCG is rising well.  F/u for NOB and routine care.  Thx   clh-S

## 2017-07-30 ENCOUNTER — Other Ambulatory Visit: Payer: BLUE CROSS/BLUE SHIELD

## 2017-08-06 ENCOUNTER — Ambulatory Visit (INDEPENDENT_AMBULATORY_CARE_PROVIDER_SITE_OTHER): Payer: BLUE CROSS/BLUE SHIELD | Admitting: Certified Nurse Midwife

## 2017-08-06 ENCOUNTER — Encounter: Payer: Self-pay | Admitting: Certified Nurse Midwife

## 2017-08-06 VITALS — BP 114/68 | HR 90 | Wt 161.0 lb

## 2017-08-06 DIAGNOSIS — Z3481 Encounter for supervision of other normal pregnancy, first trimester: Secondary | ICD-10-CM

## 2017-08-06 DIAGNOSIS — Z3480 Encounter for supervision of other normal pregnancy, unspecified trimester: Secondary | ICD-10-CM

## 2017-08-06 DIAGNOSIS — K529 Noninfective gastroenteritis and colitis, unspecified: Secondary | ICD-10-CM

## 2017-08-06 DIAGNOSIS — Z113 Encounter for screening for infections with a predominantly sexual mode of transmission: Secondary | ICD-10-CM

## 2017-08-06 DIAGNOSIS — Z3687 Encounter for antenatal screening for uncertain dates: Secondary | ICD-10-CM | POA: Diagnosis not present

## 2017-08-06 NOTE — Progress Notes (Signed)
Subjective:  Vanessa Larson is a 36 y.o. [id number] at [redacted]w[redacted]d being seen today for first prenatal care visit.  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  Patient reports loose stools and diarrhea x1 week. No blood. No recent travel. No sick contacts. No fevers. .   Lockie Pares. Bleeding: None.   . Denies leaking of fluid.   The following portions of the patient's history were reviewed and updated as appropriate: allergies, current medications, past family history, past medical history, past social history, past surgical history and problem list. Problem list updated.  Objective:   Vitals:   08/06/17 0841  BP: 114/68  Pulse: 90  Weight: 161 lb (73 kg)    Fetal Status:           General:  Alert, oriented and cooperative. Patient is in no acute distress.  Skin: Skin is warm and dry. No rash noted.   Cardiovascular: Normal heart rate noted  Respiratory: Normal respiratory effort, no problems with respiration noted  Abdomen: Soft, gravid, appropriate for gestational age.       Pelvic: Vag. Bleeding: None Vag D/C Character: Thick   Cervical exam deferred        Extremities: Normal range of motion.     Mental Status: Normal mood and affect. Normal behavior. Normal judgment and thought content.   Urinalysis:      Assessment and Plan:  Pregnancy: [id number] at [redacted]w[redacted]d  1. Supervision of other normal pregnancy, antepartum - OB urine culture - Obstetric panel - HIV antibody (with reflex) - Urine cytology ancillary only - CHL AMB BABYSCRIPTS OPT IN  2. Chronic diarrhea - Stool culture - Clostridium difficile culture-fecal - Ova and parasite examination  Preterm labor symptoms and general obstetric precautions including but  not limited to vaginal bleeding, contractions, leaking of fluid and fetal movement were reviewed in detail with the patient. Please refer to After Visit Summary for other counseling recommendations.  Return in about 4 weeks (around 09/03/2017).   Donette Larry, CNM

## 2017-08-06 NOTE — Progress Notes (Signed)
Loose stools x 1 week.  Denies any fever or or issues. Pt only wants to have BRX app.  Bedside U/S is single IUP with FHT of 168 BPM  CRL 16.86mm  GA [redacted]w[redacted]d

## 2017-08-07 LAB — OBSTETRIC PANEL
ANTIBODY SCREEN: NOT DETECTED
BASOS ABS: 8 {cells}/uL (ref 0–200)
Basophils Relative: 0.2 %
EOS ABS: 41 {cells}/uL (ref 15–500)
Eosinophils Relative: 1 %
HCT: 35.6 % (ref 35.0–45.0)
HEP B S AG: NONREACTIVE
Hemoglobin: 11.9 g/dL (ref 11.7–15.5)
Lymphs Abs: 832 cells/uL — ABNORMAL LOW (ref 850–3900)
MCH: 27.5 pg (ref 27.0–33.0)
MCHC: 33.4 g/dL (ref 32.0–36.0)
MCV: 82.2 fL (ref 80.0–100.0)
MPV: 11.2 fL (ref 7.5–12.5)
Monocytes Relative: 7.6 %
NEUTROS PCT: 70.9 %
Neutro Abs: 2907 cells/uL (ref 1500–7800)
PLATELETS: 188 10*3/uL (ref 140–400)
RBC: 4.33 10*6/uL (ref 3.80–5.10)
RDW: 14.2 % (ref 11.0–15.0)
RPR: NONREACTIVE
Rubella: 0.94 index — ABNORMAL LOW
TOTAL LYMPHOCYTE: 20.3 %
WBC mixed population: 312 cells/uL (ref 200–950)
WBC: 4.1 10*3/uL (ref 3.8–10.8)

## 2017-08-07 LAB — OVA AND PARASITE EXAMINATION
CONCENTRATE RESULT:: NONE SEEN
MICRO NUMBER:: 81117080
SPECIMEN QUALITY: ADEQUATE
TRICHROME RESULT:: NONE SEEN

## 2017-08-07 LAB — HIV ANTIBODY (ROUTINE TESTING W REFLEX): HIV 1&2 Ab, 4th Generation: NONREACTIVE

## 2017-08-07 LAB — URINE CYTOLOGY ANCILLARY ONLY
Chlamydia: NEGATIVE
NEISSERIA GONORRHEA: NEGATIVE

## 2017-08-09 LAB — STOOL CULTURE
MICRO NUMBER: 81117078
MICRO NUMBER:: 81117076
MICRO NUMBER:: 81117077
SHIGA RESULT: NOT DETECTED
SPECIMEN QUALITY: ADEQUATE
SPECIMEN QUALITY:: ADEQUATE
SPECIMEN QUALITY:: ADEQUATE

## 2017-08-09 LAB — CULTURE, URINE COMPREHENSIVE
MICRO NUMBER:: 81117068
SPECIMEN QUALITY: ADEQUATE

## 2017-08-11 LAB — CLOSTRIDIUM DIFFICILE CULTURE-FECAL

## 2017-08-21 ENCOUNTER — Ambulatory Visit (INDEPENDENT_AMBULATORY_CARE_PROVIDER_SITE_OTHER): Payer: BLUE CROSS/BLUE SHIELD | Admitting: Obstetrics & Gynecology

## 2017-08-21 ENCOUNTER — Other Ambulatory Visit (INDEPENDENT_AMBULATORY_CARE_PROVIDER_SITE_OTHER): Payer: BLUE CROSS/BLUE SHIELD

## 2017-08-21 VITALS — BP 118/67 | HR 82

## 2017-08-21 DIAGNOSIS — O26851 Spotting complicating pregnancy, first trimester: Secondary | ICD-10-CM | POA: Diagnosis not present

## 2017-08-21 DIAGNOSIS — Z3A10 10 weeks gestation of pregnancy: Secondary | ICD-10-CM

## 2017-08-21 DIAGNOSIS — Z3480 Encounter for supervision of other normal pregnancy, unspecified trimester: Secondary | ICD-10-CM

## 2017-08-21 DIAGNOSIS — R309 Painful micturition, unspecified: Secondary | ICD-10-CM

## 2017-08-21 LAB — POCT URINALYSIS DIPSTICK
Bilirubin, UA: NEGATIVE
GLUCOSE UA: NEGATIVE
KETONES UA: NEGATIVE
Leukocytes, UA: NEGATIVE
Nitrite, UA: NEGATIVE
Protein, UA: NEGATIVE
RBC UA: NEGATIVE
SPEC GRAV UA: 1.01 (ref 1.010–1.025)
UROBILINOGEN UA: 0.2 U/dL
pH, UA: 5 (ref 5.0–8.0)

## 2017-08-21 NOTE — Progress Notes (Signed)
She is here for a work in visit.She says that she usually "feels pregnant" and she doesn't have that feeling now. Wanted some reassurance. She has had a brown mucous discharge for the last week, "sporadic".   Her u/s showed a fetus with a normal heart rate. She reports that she feels reassured.   Keep schedule appt

## 2017-08-24 LAB — URINE CULTURE
MICRO NUMBER:: 81184788
SPECIMEN QUALITY: ADEQUATE

## 2017-08-31 ENCOUNTER — Telehealth: Payer: Self-pay | Admitting: *Deleted

## 2017-08-31 DIAGNOSIS — N390 Urinary tract infection, site not specified: Secondary | ICD-10-CM

## 2017-08-31 MED ORDER — SULFAMETHOXAZOLE-TRIMETHOPRIM 800-160 MG PO TABS: 1.0000 | ORAL_TABLET | Freq: Two times a day (BID) | ORAL | 0 refills | Status: DC

## 2017-08-31 NOTE — Telephone Encounter (Signed)
Pt notified of positive urine culture and RX for Bactrim DS sent to her pharmacy per Dr Marice Potterove.

## 2017-08-31 NOTE — Telephone Encounter (Signed)
-----   Message from Allie BossierMyra C Dove, MD sent at 08/24/2017  1:30 PM EDT ----- She will need bactrim ds for a week

## 2017-09-03 ENCOUNTER — Ambulatory Visit (INDEPENDENT_AMBULATORY_CARE_PROVIDER_SITE_OTHER): Payer: BLUE CROSS/BLUE SHIELD | Admitting: Advanced Practice Midwife

## 2017-09-03 VITALS — BP 108/63 | HR 85 | Wt 166.0 lb

## 2017-09-03 DIAGNOSIS — Z3480 Encounter for supervision of other normal pregnancy, unspecified trimester: Secondary | ICD-10-CM

## 2017-09-03 DIAGNOSIS — Z363 Encounter for antenatal screening for malformations: Secondary | ICD-10-CM

## 2017-09-03 DIAGNOSIS — Z283 Underimmunization status: Secondary | ICD-10-CM | POA: Insufficient documentation

## 2017-09-03 DIAGNOSIS — O34219 Maternal care for unspecified type scar from previous cesarean delivery: Secondary | ICD-10-CM

## 2017-09-03 DIAGNOSIS — Z3481 Encounter for supervision of other normal pregnancy, first trimester: Secondary | ICD-10-CM

## 2017-09-03 DIAGNOSIS — O9989 Other specified diseases and conditions complicating pregnancy, childbirth and the puerperium: Secondary | ICD-10-CM

## 2017-09-03 DIAGNOSIS — O09899 Supervision of other high risk pregnancies, unspecified trimester: Secondary | ICD-10-CM

## 2017-09-03 DIAGNOSIS — Z2839 Other underimmunization status: Secondary | ICD-10-CM

## 2017-09-03 DIAGNOSIS — Z3A18 18 weeks gestation of pregnancy: Secondary | ICD-10-CM

## 2017-09-03 NOTE — Progress Notes (Signed)
   PRENATAL VISIT NOTE  Subjective:  Vanessa Larson is a 36 y.o. [id number] at 5452w1d being seen today for ongoing prenatal care.  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  Patient reports no complaints. Very anxious about this being the gestational age of previous SAB. Tearful.  Contractions: Not present. Vag. Bleeding: None.  Movement: Absent. Denies leaking of fluid.   Notified of asymptomatic bacteriuria, but didn't start Bactrim due to warning on Rx. Informed that it is safe to take at this GA.                    The following portions of the patient's history were reviewed and updated as appropriate: allergies, current medications, past family history, past medical history, past social history, past surgical history and problem list. Problem list updated.  Objective:   Vitals:   09/03/17 0831  BP: 108/63  Pulse: 85  Weight: 166 lb (75.3 kg)    Fetal Status: Fetal Heart Rate (bpm): 162   Movement: Absent    FHR and fetal mvmt visualized by BS US. Pt reassured.   General:  Alert, oriented and cooperative. Patient is in no acute distress.  Skin: Skin is warm and dry. No rash noted.   Cardiovascular: Normal heart rate noted  Respiratory: Normal respiratory effort, no problems with respiration noted  Abdomen: Soft, gravid, appropriate for gestational age.  Pain/Pressure: Absent     Pelvic: Cervical exam deferred        Extremities: Normal range of motion.  Edema: None  Mental Status:  Normal mood and affect. Normal behavior. Normal judgment and thought content.   Assessment and Plan:  Pregnancy: [id number] at 652w1d  1. Supervision of other normal pregnancy,  antepartum  - US MFM OB COMP + 14 WK; Future  2. Rubella non-immune status, antepartum  - US MFM OB COMP + 14 WK; Future  3. History of cesarean delivery affecting pregnancy  - US MFM OB COMP + 14 WK; Future  4. Screening, antenatal, for malformation by ultrasound  - US MFM OB COMP + 14 WK; Future  5. [redacted] weeks gestation of pregnancy  - US MFM OB COMP + 14 WK; Future  6. Asymptomatic bacteriuria - Tae Bactrim as directed.  - TOC at [place] - Pyelo precautions  Preterm labor symptoms and general obstetric precautions including but not limited to vaginal bleeding, contractions, leaking of fluid and fetal movement were reviewed in detail with the patient. Please refer to After Visit Summary for other counseling recommendations.  Return in about 4 weeks (around 10/01/2017) for ROB.   Dorathy KinsmanVirginia Cyrilla Durkin, CNM

## 2017-09-03 NOTE — Patient Instructions (Signed)
Second Trimester of Pregnancy The second trimester is from week 13 through week 28, month 4 through 6. This is often the time in pregnancy that you feel your best. Often times, morning sickness has lessened or quit. You may have more energy, and you may get hungry more often. Your unborn baby (fetus) is growing rapidly. At the end of the sixth month, he or she is about 9 inches long and weighs about 1 pounds. You will likely feel the baby move (quickening) between 18 and 20 weeks of pregnancy. Follow these instructions at home:  Avoid all smoking, herbs, and alcohol. Avoid drugs not approved by your doctor.  Do not use any tobacco products, including cigarettes, chewing tobacco, and electronic cigarettes. If you need help quitting, ask your doctor. You may get counseling or other support to help you quit.  Only take medicine as told by your doctor. Some medicines are safe and some are not during pregnancy.  Exercise only as told by your doctor. Stop exercising if you start having cramps.  Eat regular, healthy meals.  Wear a good support bra if your breasts are tender.  Do not use hot tubs, steam rooms, or saunas.  Wear your seat belt when driving.  Avoid raw meat, uncooked cheese, and liter boxes and soil used by cats.  Take your prenatal vitamins.  Take 1500-2000 milligrams of calcium daily starting at the 20th week of pregnancy until you deliver your baby.  Try taking medicine that helps you poop (stool softener) as needed, and if your doctor approves. Eat more fiber by eating fresh fruit, vegetables, and whole grains. Drink enough fluids to keep your pee (urine) clear or pale yellow.  Take warm water baths (sitz baths) to soothe pain or discomfort caused by hemorrhoids. Use hemorrhoid cream if your doctor approves.  If you have puffy, bulging veins (varicose veins), wear support hose. Raise (elevate) your feet for 15 minutes, 3-4 times a day. Limit salt in your diet.  Avoid heavy  lifting, wear low heals, and sit up straight.  Rest with your legs raised if you have leg cramps or low back pain.  Visit your dentist if you have not gone during your pregnancy. Use a soft toothbrush to brush your teeth. Be gentle when you floss.  You can have sex (intercourse) unless your doctor tells you not to.  Go to your doctor visits. Get help if:  You feel dizzy.  You have mild cramps or pressure in your lower belly (abdomen).  You have a nagging pain in your belly area.  You continue to feel sick to your stomach (nauseous), throw up (vomit), or have watery poop (diarrhea).  You have bad smelling fluid coming from your vagina.  You have pain with peeing (urination). Get help right away if:  You have a fever.  You are leaking fluid from your vagina.  You have spotting or bleeding from your vagina.  You have severe belly cramping or pain.  You lose or gain weight rapidly.  You have trouble catching your breath and have chest pain.  You notice sudden or extreme puffiness (swelling) of your face, hands, ankles, feet, or legs.  You have not felt the baby move in over an hour.  You have severe headaches that do not go away with medicine.  You have vision changes. This information is not intended to replace advice given to you by your health care provider. Make sure you discuss any questions you have with your health care   provider. Document Released: 01/10/2010 Document Revised: 03/23/2016 Document Reviewed: 12/17/2012 Elsevier Interactive Patient Education  2017 Elsevier Inc.  

## 2017-10-01 ENCOUNTER — Ambulatory Visit (INDEPENDENT_AMBULATORY_CARE_PROVIDER_SITE_OTHER): Payer: BLUE CROSS/BLUE SHIELD | Admitting: Advanced Practice Midwife

## 2017-10-01 ENCOUNTER — Encounter: Payer: Self-pay | Admitting: Advanced Practice Midwife

## 2017-10-01 VITALS — BP 112/64 | HR 90 | Temp 97.8°F | Wt 170.0 lb

## 2017-10-01 DIAGNOSIS — O9989 Other specified diseases and conditions complicating pregnancy, childbirth and the puerperium: Secondary | ICD-10-CM

## 2017-10-01 DIAGNOSIS — Z3480 Encounter for supervision of other normal pregnancy, unspecified trimester: Secondary | ICD-10-CM

## 2017-10-01 DIAGNOSIS — R8271 Bacteriuria: Secondary | ICD-10-CM

## 2017-10-01 DIAGNOSIS — Z3482 Encounter for supervision of other normal pregnancy, second trimester: Secondary | ICD-10-CM

## 2017-10-01 NOTE — Progress Notes (Signed)
Routine

## 2017-10-01 NOTE — Patient Instructions (Signed)

## 2017-10-01 NOTE — Progress Notes (Signed)
   PRENATAL VISIT NOTE  Subjective:  Vanessa Larson is a 36 y.o. [id number] at 3183w1d being seen today for ongoing prenatal care.  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  Patient reports no complaints.  Contractions: Not present. Vag. Bleeding: None.  Movement: Absent. Denies leaking of fluid.   The following portions of the patient's history were reviewed and updated as appropriate: allergies, current medications, past family history, past medical history, past social history, past surgical history and problem list. Problem list updated.  Objective:   Vitals:   10/01/17 0829  BP: 112/64  Pulse: 90  Temp: 97.8 F (36.6 C)  Weight: 170 lb (77.1 kg)    Fetal Status: Fetal Heart Rate (bpm): 155   Movement: Absent     General:  Alert, oriented and cooperative. Patient is in no acute distress.  Skin: Skin is warm and dry. No rash noted.   Cardiovascular: Normal heart rate noted  Respiratory: Normal respiratory effort, no problems with respiration noted  Abdomen: Soft, gravid, appropriate for gestational age.  Pain/Pressure: Absent     Pelvic: Cervical exam deferred        Extremities: Normal range of motion.  Edema: None  Mental Status:  Normal mood and affect. Normal behavior. Normal judgment and thought content.   Assessment and Plan:  Pregnancy: [id number] at 3483w1d  1. Bacteriuria during pregnancy in second trimester  - Forgot to take ABX. No Sx. Wants to reculture rather than just start ABX. Discussed risk of Pyelo and Sx to go to [facility] for.  - Culture, OB Urine  2. Supervision of other normal pregnancy, antepartum   Preterm labor symptoms and general obstetric  precautions including but not limited to vaginal bleeding, contractions, leaking of fluid and fetal movement were reviewed in detail with the patient. Please refer to After Visit Summary for other counseling recommendations.  Return in about 4 weeks (around 10/29/2017) for ROB.   Dorathy KinsmanVirginia Arrin Ishler, CNM

## 2017-10-04 ENCOUNTER — Telehealth: Payer: Self-pay | Admitting: *Deleted

## 2017-10-04 LAB — URINE CULTURE, OB REFLEX

## 2017-10-04 LAB — CULTURE, OB URINE

## 2017-10-04 NOTE — Telephone Encounter (Signed)
Pt notified of abnormal urine culture and she is going to start her ATB today.

## 2017-10-11 ENCOUNTER — Encounter (HOSPITAL_COMMUNITY): Payer: Self-pay | Admitting: Advanced Practice Midwife

## 2017-10-19 ENCOUNTER — Other Ambulatory Visit: Payer: Self-pay | Admitting: Advanced Practice Midwife

## 2017-10-19 ENCOUNTER — Ambulatory Visit (HOSPITAL_COMMUNITY)
Admission: RE | Admit: 2017-10-19 | Discharge: 2017-10-19 | Disposition: A | Discharge status: Home / Self Care | Payer: BLUE CROSS/BLUE SHIELD | Source: Ambulatory Visit | Attending: Advanced Practice Midwife | Admitting: Advanced Practice Midwife

## 2017-10-19 ENCOUNTER — Encounter (HOSPITAL_COMMUNITY): Payer: Self-pay

## 2017-10-19 DIAGNOSIS — Z363 Encounter for antenatal screening for malformations: Secondary | ICD-10-CM

## 2017-10-19 DIAGNOSIS — Z283 Underimmunization status: Secondary | ICD-10-CM

## 2017-10-19 DIAGNOSIS — Z3689 Encounter for other specified antenatal screening: Secondary | ICD-10-CM | POA: Insufficient documentation

## 2017-10-19 DIAGNOSIS — O09899 Supervision of other high risk pregnancies, unspecified trimester: Secondary | ICD-10-CM

## 2017-10-19 DIAGNOSIS — Z3A18 18 weeks gestation of pregnancy: Secondary | ICD-10-CM

## 2017-10-19 DIAGNOSIS — O09522 Supervision of elderly multigravida, second trimester: Secondary | ICD-10-CM

## 2017-10-19 DIAGNOSIS — O9989 Other specified diseases and conditions complicating pregnancy, childbirth and the puerperium: Secondary | ICD-10-CM

## 2017-10-19 DIAGNOSIS — Z3480 Encounter for supervision of other normal pregnancy, unspecified trimester: Secondary | ICD-10-CM

## 2017-10-19 DIAGNOSIS — O34219 Maternal care for unspecified type scar from previous cesarean delivery: Secondary | ICD-10-CM

## 2017-10-19 DIAGNOSIS — Z2839 Other underimmunization status: Secondary | ICD-10-CM

## 2017-10-19 HISTORY — DX: Unspecified abnormal cytological findings in specimens from vagina: R87.629

## 2017-10-29 ENCOUNTER — Ambulatory Visit (INDEPENDENT_AMBULATORY_CARE_PROVIDER_SITE_OTHER): Payer: BLUE CROSS/BLUE SHIELD | Admitting: Advanced Practice Midwife

## 2017-10-29 VITALS — BP 118/73 | HR 97 | Wt 174.0 lb

## 2017-10-29 DIAGNOSIS — O99891 Other specified diseases and conditions complicating pregnancy: Secondary | ICD-10-CM | POA: Insufficient documentation

## 2017-10-29 DIAGNOSIS — Z3482 Encounter for supervision of other normal pregnancy, second trimester: Secondary | ICD-10-CM

## 2017-10-29 DIAGNOSIS — M549 Dorsalgia, unspecified: Secondary | ICD-10-CM

## 2017-10-29 DIAGNOSIS — O9989 Other specified diseases and conditions complicating pregnancy, childbirth and the puerperium: Secondary | ICD-10-CM

## 2017-10-29 DIAGNOSIS — Z3480 Encounter for supervision of other normal pregnancy, unspecified trimester: Secondary | ICD-10-CM

## 2017-10-29 DIAGNOSIS — O34219 Maternal care for unspecified type scar from previous cesarean delivery: Secondary | ICD-10-CM

## 2017-10-29 MED ORDER — COMFORT FIT MATERNITY SUPP MED MISC: 1.0000 | Freq: Every day | 0 refills | Status: DC

## 2017-10-29 NOTE — Progress Notes (Signed)
   PRENATAL VISIT NOTE  Subjective:  Vanessa Larson is a 36 y.o. [id number] at 4369w1d being seen today for ongoing prenatal care.  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  Patient reports no complaints.  Contractions: Not present. Vag. Bleeding: None.  Movement: Present. Denies leaking of fluid.   The following portions of the patient's history were reviewed and updated as appropriate: allergies, current medications, past family history, past medical history, past social history, past surgical history and problem list. Problem list updated.  Objective:   Vitals:   10/29/17 0829  BP: 118/73  Pulse: 97  Weight: 174 lb (78.9 kg)    Fetal Status: Fetal Heart Rate (bpm): 148   Movement: Present     General:  Alert, oriented and cooperative. Patient is in no acute distress.  Skin: Skin is warm and dry. No rash noted.   Cardiovascular: Normal heart rate noted  Respiratory: Normal respiratory effort, no problems with respiration noted  Abdomen: Soft, gravid, appropriate for gestational age.  Pain/Pressure: Absent     Pelvic: Cervical exam deferred        Extremities: Normal range of motion.  Edema: None  Mental Status:  Normal mood and affect. Normal behavior. Normal judgment and thought content.   Assessment and Plan:  Pregnancy: [id number] at 2769w1d  1. Back pain in pregnancy --Pt has intermittent back pain, normal aches and pains of pregnancy.  She is planning [place] in [place]. Discussed travel safety in pregnancy, including frequent stretching/walking, increased PO fluids, and warning signs/reasons to seek care. - Elastic  Bandages & Supports (COMFORT FIT MATERNITY SUPP MED) MISC; 1 Device by Does not apply route daily.  Dispense: 1 each; Refill: 0  2. Supervision of other normal pregnancy, antepartum   3. History of cesarean delivery affecting pregnancy --With first pregnancy, then VBAC x 3 (waterbirth for first 2)  Preterm labor symptoms and general obstetric precautions including but not limited to vaginal bleeding, contractions, leaking of fluid and fetal movement were reviewed in detail with the patient. Please refer to After Visit Summary for other counseling recommendations.  No Follow-up on file.   Sharen CounterLisa Leftwich-Kirby, CNM

## 2017-10-29 NOTE — Addendum Note (Signed)
Addended by: Granville LewisLARK, Librado Guandique L on: 10/29/2017 09:26 AM   Modules accepted: Orders

## 2017-10-30 NOTE — L&D Delivery Note (Signed)
Patient is a 37 y.o. now G6P6 s/p NSVD at [redacted]w[redacted]d, who was admitted for SOL.  She progressed with augmentation (AROM) complete and pushed to deliver.  Cord clamping delayed by 15 minutes until cord stopped pulsating then clamped by CNM and cut by FOB.  Placenta intact and spontaneous, bleeding minimal.  1st degree laceration not repaired- hemostatic.  Mom and baby stable prior to transfer to postpartum. She plans on breastfeeding. She is unsure for birth control.  Delivery Note At 1:34 PM a viable and healthy female was delivered via VBAC, Spontaneous (Presentation: LOA  ).  APGAR: 7, 9; weight pending .   Placenta intact and spontaneous, bleeding minimal. 3VCord:  with no complications.   Anesthesia: None  Episiotomy: None Lacerations: 1st degree perineal- hemostatic, not repaired  Suture Repair: none Est. Blood Loss (mL):  100  Mom to postpartum.  Baby to Couplet care / Skin to Skin.  Sharyon Cable CNM 03/12/2018, 2:24 PM   Please schedule this patient for Postpartum visit in: 4 weeks with the following provider: Any provider For C/S patients schedule nurse incision check in weeks 2 weeks: no Low risk pregnancy complicated by: none Delivery mode:  SVD Anticipated Birth Control:  other/unsure PP Procedures needed: none  Schedule Integrated BH visit: no

## 2017-10-31 LAB — CULTURE, URINE COMPREHENSIVE
MICRO NUMBER: 81463538
SPECIMEN QUALITY:: ADEQUATE

## 2017-11-07 ENCOUNTER — Encounter: Payer: Self-pay | Admitting: *Deleted

## 2017-11-26 ENCOUNTER — Encounter: Payer: BLUE CROSS/BLUE SHIELD | Admitting: Advanced Practice Midwife

## 2017-11-26 ENCOUNTER — Other Ambulatory Visit: Payer: Self-pay | Admitting: Advanced Practice Midwife

## 2017-11-26 DIAGNOSIS — H109 Unspecified conjunctivitis: Secondary | ICD-10-CM | POA: Insufficient documentation

## 2017-11-26 DIAGNOSIS — H1033 Unspecified acute conjunctivitis, bilateral: Secondary | ICD-10-CM

## 2017-11-26 MED ORDER — TOBRAMYCIN-DEXAMETHASONE 0.3-0.1 % OP SUSP: 1.0000 [drp] | OPHTHALMIC | 0 refills | Status: DC

## 2017-12-03 ENCOUNTER — Ambulatory Visit (INDEPENDENT_AMBULATORY_CARE_PROVIDER_SITE_OTHER): Payer: Managed Care, Other (non HMO) | Admitting: Advanced Practice Midwife

## 2017-12-03 ENCOUNTER — Encounter: Payer: Self-pay | Admitting: Advanced Practice Midwife

## 2017-12-03 VITALS — BP 108/64 | HR 101 | Wt 171.0 lb

## 2017-12-03 DIAGNOSIS — Z3480 Encounter for supervision of other normal pregnancy, unspecified trimester: Secondary | ICD-10-CM

## 2017-12-03 NOTE — Progress Notes (Signed)
   PRENATAL VISIT NOTE  Subjective:  Vanessa Larson is a 37 y.o. [id number] at 3969w1d being seen today for ongoing prenatal care.  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  Patient reports no complaints.  Contractions: Regular. Vag. Bleeding: None.  Movement: Absent. Denies leaking of fluid.   The following portions of the patient's history were reviewed and updated as appropriate: allergies, current medications, past family history, past medical history, past social history, past surgical history and problem list. Problem list updated.  Objective:   Vitals:   12/03/17 1100  BP: 108/64  Pulse: (!) 101  Weight: 171 lb (77.6 kg)    Fetal Status: Fetal Heart Rate (bpm): 141   Movement: Absent     General:  Alert, oriented and cooperative. Patient is in no acute distress.  Skin: Skin is warm and dry. No rash noted.   Cardiovascular: Normal heart rate noted  Respiratory: Normal respiratory effort, no problems with respiration noted  Abdomen: Soft, gravid, appropriate for gestational age.  Pain/Pressure: Absent     Pelvic: Cervical exam deferred        Extremities: Normal range of motion.  Edema: None  Mental Status:  Normal mood and affect. Normal behavior. Normal judgment and thought content.   Assessment and Plan:  Pregnancy: [id number] at 8269w1d  1. Supervision of other normal pregnancy, antepartum      Still disappointed can't have waterbirth (had with first two VBACs, now is against protocol)       Wants "hands-off" as much as possible with birth to reduce stress      [place], will do glucola before  Preterm labor  symptoms and general obstetric precautions including but not limited to vaginal bleeding, contractions, leaking of fluid and fetal movement were reviewed in detail with the patient. Please refer to After Visit Summary for other counseling recommendations.  Return in about 3 weeks (around 12/24/2017).   Wynelle BourgeoisMarie Qasim Diveley, CNM

## 2017-12-03 NOTE — Patient Instructions (Signed)

## 2017-12-13 ENCOUNTER — Ambulatory Visit (INDEPENDENT_AMBULATORY_CARE_PROVIDER_SITE_OTHER): Payer: Managed Care, Other (non HMO) | Admitting: Obstetrics & Gynecology

## 2017-12-13 VITALS — BP 104/61 | HR 105 | Temp 97.5°F | Wt 170.0 lb

## 2017-12-13 DIAGNOSIS — Z3480 Encounter for supervision of other normal pregnancy, unspecified trimester: Secondary | ICD-10-CM

## 2017-12-13 DIAGNOSIS — R634 Abnormal weight loss: Secondary | ICD-10-CM

## 2017-12-13 NOTE — Progress Notes (Signed)
Pt c/o cough and congestion. Has been seen by urgent care & [facility]. Pt states doctors would not give her medication since she was pregnant.

## 2017-12-13 NOTE — Progress Notes (Signed)
   Subjective:    Patient ID: Vanessa Larson, female    DOB: 05-15-1981, 37 y.o.   MRN: [id number]  HPI 37 yo lady at 926 4/7 weeks here with a 2 week cough, no fevers. She was seen at primary care 2 times and they reassured her that this was viral. Her main concern is that she is coughing so much at night that her sleep is very poor.   Review of Systems     Objective:   Physical Exam Well nourished, well hydrated White female, no apparent distress Breathing, conversing, and ambulating normally She is demonstrating a non- productive dry cough She reports FM, somewhat less than when she is not coughing FHR- reassuring    Assessment & Plan:  Viral URI- reassurance given. Told her that this cough may last several weeks more I gave her a list of over the counter meds that she can take I have ordered an ultrasound due to poor weight gain in pregnancy

## 2017-12-19 ENCOUNTER — Ambulatory Visit (HOSPITAL_COMMUNITY): Payer: Managed Care, Other (non HMO)

## 2017-12-21 ENCOUNTER — Ambulatory Visit (INDEPENDENT_AMBULATORY_CARE_PROVIDER_SITE_OTHER): Payer: Managed Care, Other (non HMO) | Admitting: Advanced Practice Midwife

## 2017-12-21 VITALS — BP 101/61 | HR 95 | Wt 173.0 lb

## 2017-12-21 DIAGNOSIS — Z3480 Encounter for supervision of other normal pregnancy, unspecified trimester: Secondary | ICD-10-CM

## 2017-12-21 DIAGNOSIS — Z2839 Other underimmunization status: Secondary | ICD-10-CM

## 2017-12-21 DIAGNOSIS — O34219 Maternal care for unspecified type scar from previous cesarean delivery: Secondary | ICD-10-CM

## 2017-12-21 DIAGNOSIS — Z283 Underimmunization status: Secondary | ICD-10-CM

## 2017-12-21 DIAGNOSIS — Z3490 Encounter for supervision of normal pregnancy, unspecified, unspecified trimester: Secondary | ICD-10-CM

## 2017-12-21 DIAGNOSIS — O9989 Other specified diseases and conditions complicating pregnancy, childbirth and the puerperium: Secondary | ICD-10-CM

## 2017-12-21 NOTE — Patient Instructions (Signed)
TDaP Vaccine Pregnancy Get the Whooping Cough Vaccine While You Are Pregnant (CDC)  It is important for women to get the whooping cough vaccine in the third trimester of each pregnancy. Vaccines are the best way to prevent this disease. There are 2 different whooping cough vaccines. Both vaccines combine protection against whooping cough, tetanus and diphtheria, but they are for different age groups: Tdap: for everyone 11 years or older, including pregnant women  DTaP: for children 2 months through 6 years of age  You need the whooping cough vaccine during each of your pregnancies The recommended time to get the shot is during your 27th through 36th week of pregnancy, preferably during the earlier part of this time period. The Centers for Disease Control and Prevention (CDC) recommends that pregnant women receive the whooping cough vaccine for adolescents and adults (called Tdap vaccine) during the third trimester of each pregnancy. The recommended time to get the shot is during your 27th through 36th week of pregnancy, preferably during the earlier part of this time period. This replaces the original recommendation that pregnant women get the vaccine only if they had not previously received it. The American College of Obstetricians and Gynecologists and the American College of Nurse-Midwives support this recommendation.  You should get the whooping cough vaccine while pregnant to pass protection to your baby frame support disabled and/or not supported in this browser  Learn why Vanessa Larson decided to get the whooping cough vaccine in her 3rd trimester of pregnancy and how her baby girl was born with some protection against the disease. Also available on YouTube. After receiving the whooping cough vaccine, your body will create protective antibodies (proteins produced by the body to fight off diseases) and pass some of them to your baby before birth. These antibodies provide your baby some short-term  protection against whooping cough in early life. These antibodies can also protect your baby from some of the more serious complications that come along with whooping cough. Your protective antibodies are at their highest about 2 weeks after getting the vaccine, but it takes time to pass them to your baby. So the preferred time to get the whooping cough vaccine is early in your third trimester. The amount of whooping cough antibodies in your body decreases over time. That is why CDC recommends you get a whooping cough vaccine during each pregnancy. Doing so allows each of your babies to get the greatest number of protective antibodies from you. This means each of your babies will get the best protection possible against this disease.  Getting the whooping cough vaccine while pregnant is better than getting the vaccine after you give birth Whooping cough vaccination during pregnancy is ideal so your baby will have short-term protection as soon as he is born. This early protection is important because your baby will not start getting his whooping cough vaccines until he is 2 months old. These first few months of life are when your baby is at greatest risk for catching whooping cough. This is also when he's at greatest risk for having severe, potentially life-threating complications from the infection. To avoid that gap in protection, it is best to get a whooping cough vaccine during pregnancy. You will then pass protection to your baby before he is born. To continue protecting your baby, he should get whooping cough vaccines starting at 2 months old. You may never have gotten the Tdap vaccine before and did not get it during this pregnancy. If so, you should make sure   to get the vaccine immediately after you give birth, before leaving the hospital or birthing center. It will take about 2 weeks before your body develops protection (antibodies) in response to the vaccine. Once you have protection from the vaccine,  you are less likely to give whooping cough to your newborn while caring for him. But remember, your baby will still be at risk for catching whooping cough from others. A recent study looked to see how effective Tdap was at preventing whooping cough in babies whose mothers got the vaccine while pregnant or in the hospital after giving birth. The study found that getting Tdap between 27 through 36 weeks of pregnancy is 85% more effective at preventing whooping cough in babies younger than 2 months old. Blood tests cannot tell if you need a whooping cough vaccine There are no blood tests that can tell you if you have enough antibodies in your body to protect yourself or your baby against whooping cough. Even if you have been sick with whooping cough in the past or previously received the vaccine, you still should get the vaccine during each pregnancy. Breastfeeding may pass some protective antibodies onto your baby By breastfeeding, you may pass some antibodies you have made in response to the vaccine to your baby. When you get a whooping cough vaccine during your pregnancy, you will have antibodies in your breast milk that you can share with your baby as soon as your milk comes in. However, your baby will not get protective antibodies immediately if you wait to get the whooping cough vaccine until after delivering your baby. This is because it takes about 2 weeks for your body to create antibodies. Learn more about the health benefits of breastfeeding.  

## 2017-12-21 NOTE — Progress Notes (Signed)
   PRENATAL VISIT NOTE  Subjective:  Vanessa Larson is a 37 y.o. [id number] at 7130w5d being seen today for ongoing prenatal care.  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  Patient reports no complaints.  Contractions: Not present. Vag. Bleeding: None.  Movement: Present. Denies leaking of fluid.   The following portions of the patient's history were reviewed and updated as appropriate: allergies, current medications, past family history, past medical history, past social history, past surgical history and problem list. Problem list updated.  Objective:   Vitals:   12/21/17 0856  BP: 101/61  Pulse: 95  Weight: 173 lb (78.5 kg)    Fetal Status: Fetal Heart Rate (bpm): 145   Movement: Present     General:  Alert, oriented and cooperative. Patient is in no acute distress.  Skin: Skin is warm and dry. No rash noted.   Cardiovascular: Normal heart rate noted  Respiratory: Normal respiratory effort, no problems with respiration noted  Abdomen: Soft, gravid, appropriate for gestational age.  Pain/Pressure: Absent     Pelvic: Cervical exam deferred        Extremities: Normal range of motion.  Edema: None  Mental Status:  Normal mood and affect. Normal behavior. Normal judgment and thought content.   Assessment and Plan:  Pregnancy: [id number] at 6930w5d  1. Encounter for supervision of normal pregnancy, antepartum, unspecified gravidity  - HIV antibody (with reflex) - CBC - RPR - 2Hr GTT w/ 1 Hr Carpenter 75 g  2. Rubella non-immune status, antepartum   3. Supervision of other normal pregnancy, antepartum   4. History of cesarean  delivery affecting pregnancy - TOLAC consent signed  Preterm labor symptoms and general obstetric precautions including but not limited to vaginal bleeding, contractions, leaking of fluid and fetal movement were reviewed in detail with the patient. Please refer to After Visit Summary for other counseling recommendations.  Return in about 2 weeks (around 01/04/2018) for ROB.   Dorathy KinsmanVirginia Farha Dano, CNM

## 2017-12-24 LAB — HIV ANTIBODY (ROUTINE TESTING W REFLEX): HIV: NONREACTIVE

## 2017-12-24 LAB — CBC
HEMATOCRIT: 28.8 % — AB (ref 35.0–45.0)
Hemoglobin: 9.6 g/dL — ABNORMAL LOW (ref 11.7–15.5)
MCH: 27.4 pg (ref 27.0–33.0)
MCHC: 33.3 g/dL (ref 32.0–36.0)
MCV: 82.1 fL (ref 80.0–100.0)
MPV: 10.6 fL (ref 7.5–12.5)
PLATELETS: 198 10*3/uL (ref 140–400)
RBC: 3.51 10*6/uL — ABNORMAL LOW (ref 3.80–5.10)
RDW: 14.5 % (ref 11.0–15.0)
WBC: 8.1 10*3/uL (ref 3.8–10.8)

## 2017-12-24 LAB — 2HR GTT W 1 HR, CARPENTER, 75 G
GLUCOSE, 2 HR, GEST: 115 mg/dL (ref 65–152)
GLUCOSE, FASTING, GEST: 89 mg/dL (ref 65–91)
Glucose, 1 Hr, Gest: 151 mg/dL (ref 65–179)

## 2017-12-24 LAB — RPR: RPR: NONREACTIVE

## 2017-12-30 ENCOUNTER — Encounter: Payer: Self-pay | Admitting: Advanced Practice Midwife

## 2017-12-30 DIAGNOSIS — O99019 Anemia complicating pregnancy, unspecified trimester: Secondary | ICD-10-CM | POA: Insufficient documentation

## 2018-01-01 ENCOUNTER — Telehealth: Payer: Self-pay | Admitting: *Deleted

## 2018-01-01 MED ORDER — OSELTAMIVIR PHOSPHATE 75 MG PO CAPS: 75.0000 mg | ORAL_CAPSULE | Freq: Every day | ORAL | 0 refills | Status: DC

## 2018-01-01 NOTE — Telephone Encounter (Signed)
Pt called stating that 6 of the 7 family members have tested positive for the FLU.  She has now become ill and running a fever.  Spoke with Dr Jolayne Pantheronstant who approved that the pt could have Tamiflu sent to her pharmacy.  [place] to [facility] [place].

## 2018-01-07 ENCOUNTER — Ambulatory Visit (INDEPENDENT_AMBULATORY_CARE_PROVIDER_SITE_OTHER): Payer: Managed Care, Other (non HMO)

## 2018-01-07 VITALS — BP 95/62 | HR 92 | Wt 167.0 lb

## 2018-01-07 DIAGNOSIS — O34219 Maternal care for unspecified type scar from previous cesarean delivery: Secondary | ICD-10-CM

## 2018-01-07 DIAGNOSIS — Z3480 Encounter for supervision of other normal pregnancy, unspecified trimester: Secondary | ICD-10-CM

## 2018-01-07 NOTE — Progress Notes (Signed)
   PRENATAL VISIT NOTE  Subjective:  Vanessa Larson is a 37 y.o. [id number] at 8450w1d being seen today for ongoing prenatal care.  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  Patient reports cough. Her whole family had the flu last week. Tamiflu was prophylactically called in but patient did not take it. She reports a lingering cough now.   Contractions: Not present. Vag. Bleeding: None.  Movement: Present. Denies leaking of fluid.   The following portions of the patient's history were reviewed and updated as appropriate: allergies, current medications, past family history, past medical history, past social history, past surgical history and problem list. Problem list updated.  Objective:   Vitals:   01/07/18 0852  BP: 95/62  Pulse: 92  Weight: 167 lb (75.8 kg)    Fetal Status:     Movement: Present     General:  Alert, oriented and cooperative. Patient is in no acute distress.  Skin: Skin is warm and dry. No rash noted.   Cardiovascular: Normal heart rate noted  Respiratory: Normal respiratory effort, no problems with respiration noted  Abdomen: Soft, gravid, appropriate for gestational age.  Pain/Pressure: Absent     Pelvic: Cervical exam deferred        Extremities: Normal range of motion.  Edema: None  Mental Status:  Normal mood and affect. Normal behavior. Normal judgment and thought content.   Assessment and Plan:  Pregnancy: A5W0981G6P4015 at 350w1d  1. Supervision of other normal pregnancy, antepartum -Support measures for cough reviewed -Otherwise doing well  2. History of cesarean  delivery affecting pregnancy -Planning TOLAC, consent signed  Preterm labor symptoms and general obstetric precautions including but not limited to vaginal bleeding, contractions, leaking of fluid and fetal movement were reviewed in detail with the patient. Please refer to After Visit Summary for other counseling recommendations.  Return in about 2 weeks (around 01/21/2018) for Return OB visit.   Rolm BookbinderCaroline M Maze Larson, CNM  01/07/18 9:02 AM

## 2018-01-21 ENCOUNTER — Ambulatory Visit (INDEPENDENT_AMBULATORY_CARE_PROVIDER_SITE_OTHER): Payer: Managed Care, Other (non HMO) | Admitting: Advanced Practice Midwife

## 2018-01-21 ENCOUNTER — Encounter: Payer: Self-pay | Admitting: Advanced Practice Midwife

## 2018-01-21 VITALS — BP 108/68 | HR 88 | Wt 168.0 lb

## 2018-01-21 DIAGNOSIS — M549 Dorsalgia, unspecified: Secondary | ICD-10-CM

## 2018-01-21 DIAGNOSIS — O34219 Maternal care for unspecified type scar from previous cesarean delivery: Secondary | ICD-10-CM

## 2018-01-21 DIAGNOSIS — O9989 Other specified diseases and conditions complicating pregnancy, childbirth and the puerperium: Secondary | ICD-10-CM

## 2018-01-21 DIAGNOSIS — Z3480 Encounter for supervision of other normal pregnancy, unspecified trimester: Secondary | ICD-10-CM

## 2018-01-21 NOTE — Progress Notes (Signed)
   PRENATAL VISIT NOTE  Subjective:  Vanessa HartshornMolly M Larson is a 37 y.o. [id number] at 9127w1d being seen today for ongoing prenatal care.  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  Patient reports backache and difficulty sleeping at times.  Contractions: Irritability. Vag. Bleeding: None.  Movement: Present. Denies leaking of fluid.   The following portions of the patient's history were reviewed and updated as appropriate: allergies, current medications, past family history, past medical history, past social history, past surgical history and problem list. Problem list updated.  Objective:   Vitals:   01/21/18 0854  BP: 108/68  Pulse: 88  Weight: 168 lb (76.2 kg)    Fetal Status: Fetal Heart Rate (bpm): 151 Fundal Height: 33 cm Movement: Present     General:  Alert, oriented and cooperative. Patient is in no acute distress.  Skin: Skin is warm and dry. No rash noted.   Cardiovascular: Normal heart rate noted  Respiratory: Normal respiratory effort, no problems with respiration noted  Abdomen: Soft, gravid, appropriate for gestational age.  Pain/Pressure: Absent     Pelvic: Cervical exam deferred        Extremities: Normal range of motion.  Edema: None  Mental Status:  Normal mood and affect. Normal behavior. Normal judgment and thought content.   Assessment and Plan:  Pregnancy: [id number] at 2527w1d  1. Supervision of other normal pregnancy, antepartum --Answered pt questions about sleeping positions in pregnancy. Ok to sleep on either side and sitting up in recliner as needed  --Anticipatory guidance  about next visits/weeks of pregnancy given.  2. History of cesarean delivery affecting pregnancy --C/S x 1 with first pregnancy for twins.  VBAC x 3 (2 were waterbirths) --Desires low intervention, has questions about what is Ok since she is a VBAC.  Will get more information for her to give at her next visit.  As long as continuous monitoring is possible, should have freedom of movement, be able to wear her own clothes in labor.   3. Back pain in pregnancy --Managed well with pregnancy support belt, exercises.  Pt too uncomfortable to work out as usual at her gym so letter provided to hold gym membership until 6 weeks PP.    Preterm labor symptoms and general obstetric precautions including but not limited to vaginal bleeding, contractions, leaking of fluid and fetal movement were reviewed in detail with the patient. Please refer to After Visit Summary for other counseling recommendations.  No follow-ups on file.   Sharen CounterLisa Leftwich-Kirby, CNM

## 2018-01-23 ENCOUNTER — Encounter: Payer: Self-pay | Admitting: Advanced Practice Midwife

## 2018-02-04 ENCOUNTER — Ambulatory Visit (INDEPENDENT_AMBULATORY_CARE_PROVIDER_SITE_OTHER): Payer: Managed Care, Other (non HMO)

## 2018-02-04 VITALS — BP 106/56 | HR 96 | Wt 171.0 lb

## 2018-02-04 DIAGNOSIS — O34219 Maternal care for unspecified type scar from previous cesarean delivery: Secondary | ICD-10-CM

## 2018-02-04 DIAGNOSIS — Z3483 Encounter for supervision of other normal pregnancy, third trimester: Secondary | ICD-10-CM

## 2018-02-04 DIAGNOSIS — Z3480 Encounter for supervision of other normal pregnancy, unspecified trimester: Secondary | ICD-10-CM

## 2018-02-04 NOTE — Patient Instructions (Signed)
Group B Streptococcus Infection During Pregnancy Group B Streptococcus (GBS) is a type of bacteria (Streptococcus agalactiae) that is often found in healthy people, commonly in the rectum, vagina, and intestines. In people who are healthy and not pregnant, the bacteria rarely cause serious illness or complications. However, women who test positive for GBS during pregnancy can pass the bacteria to their baby during childbirth, which can cause serious infection in the baby after birth. Women with GBS may also have infections during their pregnancy or immediately after childbirth, such as such as urinary tract infections (UTIs) or infections of the uterus (uterine infections). Having GBS also increases a woman's risk of complications during pregnancy, such as early (preterm) labor or delivery, miscarriage, or stillbirth. Routine testing (screening) for GBS is recommended for all pregnant women. What increases the risk? You may have a higher risk for GBS infection during pregnancy if you had one during a past pregnancy. What are the signs or symptoms? In most cases, GBS infection does not cause symptoms in pregnant women. Signs and symptoms of a possible GBS-related infection may include:  Labor starting before the 37th week of pregnancy.  A UTI or bladder infection, which may cause: ? Fever. ? Pain or burning during urination. ? Frequent urination.  Fever during labor, along with: ? Bad-smelling discharge. ? Uterine tenderness. ? Rapid heartbeat in the mother, baby, or both.  Rare but serious symptoms of a possible GBS-related infection in women include:  Blood infection (septicemia). This may cause fever, chills, or confusion.  Lung infection (pneumonia). This may cause fever, chills, cough, rapid breathing, difficulty breathing, or chest pain.  Bone, joint, skin, or soft tissue infection.  How is this diagnosed? You may be screened for GBS between week 35 and week 37 of your pregnancy. If  you have symptoms of preterm labor, you may be screened earlier. This condition is diagnosed based on lab test results from:  A swab of fluid from the vagina and rectum.  A urine sample.  How is this treated? This condition is treated with antibiotic medicine. When you go into labor, or as soon as your water breaks (your membranes rupture), you will be given antibiotics through an IV tube. Antibiotics will continue until after you give birth. If you are having a cesarean delivery, you do not need antibiotics unless your membranes have already ruptured. Follow these instructions at home:  Take over-the-counter and prescription medicines only as told by your health care provider.  Take your antibiotic medicine as told by your health care provider. Do not stop taking the antibiotic even if you start to feel better.  Keep all pre-birth (prenatal) visits and follow-up visits as told by your health care provider. This is important. Contact a health care provider if:  You have pain or burning when you urinate.  You have to urinate frequently.  You have a fever or chills.  You develop a bad-smelling vaginal discharge. Get help right away if:  Your membranes rupture.  You go into labor.  You have severe pain in your abdomen.  You have difficulty breathing.  You have chest pain. This information is not intended to replace advice given to you by your health care provider. Make sure you discuss any questions you have with your health care provider. Document Released: 01/23/2008 Document Revised: 05/12/2016 Document Reviewed: 05/11/2016 Elsevier Interactive Patient Education  2018 Elsevier Inc[employer]  

## 2018-02-04 NOTE — Progress Notes (Signed)
   PRENATAL VISIT NOTE  Subjective:  Vanessa Larson is a 37 y.o. [id number] at 5711w1d being seen today for ongoing prenatal care.  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  Patient reports no complaints.  Contractions: Irritability. Vag. Bleeding: None.  Movement: Present. Denies leaking of fluid.   The following portions of the patient's history were reviewed and updated as appropriate: allergies, current medications, past family history, past medical history, past social history, past surgical history and problem list. Problem list updated.  Objective:   Vitals:   02/04/18 0909  BP: (!) 106/56  Pulse: 96  Weight: 171 lb (77.6 kg)    Fetal Status: Fetal Heart Rate (bpm): 144 Fundal Height: 34 cm Movement: Present     General:  Alert, oriented and cooperative. Patient is in no acute distress.  Skin: Skin is warm and dry. No rash noted.   Cardiovascular: Normal heart rate noted  Respiratory: Normal respiratory effort, no problems with respiration noted  Abdomen: Soft, gravid, appropriate for gestational age.  Pain/Pressure: Absent     Pelvic: Cervical exam deferred        Extremities: Normal range of motion.  Edema: None  Mental Status: Normal mood and affect. Normal behavior. Normal judgment and thought content.   Assessment and Plan:  Pregnancy: [id number] at 6211w1d  1. Supervision of other normal pregnancy, antepartum - No complaints. Routine care. - Patient worried about size of baby because last baby was large. Feels like this baby is smaller and measures appropriately. Reassurance provided and  will continue to monitor.   2. History of cesarean delivery affecting pregnancy - Planning TOLAC  Preterm labor symptoms and general obstetric precautions including but not limited to vaginal bleeding, contractions, leaking of fluid and fetal movement were reviewed in detail with the patient. Please refer to After Visit Summary for other counseling recommendations.  Return in about 2 weeks (around 02/18/2018) for Return OB visit.  Rolm BookbinderCaroline M Tamorah Hada, CNM 02/04/18 9:35 AM

## 2018-02-18 ENCOUNTER — Ambulatory Visit (INDEPENDENT_AMBULATORY_CARE_PROVIDER_SITE_OTHER): Payer: Managed Care, Other (non HMO) | Admitting: Advanced Practice Midwife

## 2018-02-18 ENCOUNTER — Encounter: Payer: Self-pay | Admitting: Advanced Practice Midwife

## 2018-02-18 VITALS — BP 107/69 | HR 95 | Wt 172.0 lb

## 2018-02-18 DIAGNOSIS — Z3483 Encounter for supervision of other normal pregnancy, third trimester: Secondary | ICD-10-CM

## 2018-02-18 DIAGNOSIS — Z3689 Encounter for other specified antenatal screening: Secondary | ICD-10-CM | POA: Diagnosis not present

## 2018-02-18 DIAGNOSIS — Z113 Encounter for screening for infections with a predominantly sexual mode of transmission: Secondary | ICD-10-CM | POA: Diagnosis not present

## 2018-02-18 NOTE — Patient Instructions (Signed)

## 2018-02-18 NOTE — Progress Notes (Signed)
   PRENATAL VISIT NOTE  Subjective:  Vanessa HartshornMolly M Larson is a 37 y.o. [id number] at 5215w1d being seen today for ongoing prenatal care.  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  Patient reports occasional contractions.  Contractions: Irritability. Vag. Bleeding: None.  Movement: Present. Denies leaking of fluid.   The following portions of the patient's history were reviewed and updated as appropriate: allergies, current medications, past family history, past medical history, past social history, past surgical history and problem list. Problem list updated.  Objective:   Vitals:   02/18/18 0925  BP: 107/69  Pulse: 95  Weight: 172 lb (78 kg)    Fetal Status: Fetal Heart Rate (bpm): 142   Movement: Present     General:  Alert, oriented and cooperative. Patient is in no acute distress.  Skin: Skin is warm and dry. No rash noted.   Cardiovascular: Normal heart rate noted  Respiratory: Normal respiratory effort, no problems with respiration noted  Abdomen: Soft, gravid, appropriate for gestational age.  Pain/Pressure: Absent     Pelvic: Cervical exam performed      1-2/70/-3/vtx EFW 5.5-6lbs now  Extremities: Normal range of motion.  Edema: None  Mental Status: Normal mood and affect. Normal behavior. Normal judgment and thought content.   Assessment and Plan:  Pregnancy: [id number] at 6815w1d  1. Encounter for supervision of other normal pregnancy in third trimester      Discussed birth plan, low intervention  - GC/Chlamydia probe amp ([place] - Culture, beta strep (group b only)  Term  labor symptoms and general obstetric precautions including but not limited to vaginal bleeding, contractions, leaking of fluid and fetal movement were reviewed in detail with the patient. Please refer to After Visit Summary for other counseling recommendations.  Return in about 1 week (around 02/25/2018) [place] [facility].  Future Appointments  Date Time Provider Department Center  02/25/2018  9:10 AM Hurshel PartyLeftwich-Kirby, Lisa A, CNM [facility]  03/04/2018  8:15 AM Aviva SignsWilliams, Banjamin Stovall L, CNM [facility]    Wynelle BourgeoisMarie Lameka Disla, CNM

## 2018-02-18 NOTE — Progress Notes (Deleted)
   PRENATAL VISIT NOTE  Subjective:  Vanessa Larson is a 37 y.o. [id number] at 8044w1d being seen today for ongoing prenatal care.  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  Patient reports {sx:[id number]}.  Contractions: Irritability. Vag. Bleeding: None.  Movement: Present. Denies leaking of fluid.   The following portions of the patient's history were reviewed and updated as appropriate: allergies, current medications, past family history, past medical history, past social history, past surgical history and problem list. Problem list updated.  Objective:   Vitals:   02/18/18 0925  BP: 107/69  Pulse: 95  Weight: 172 lb (78 kg)    Fetal Status: Fetal Heart Rate (bpm): 142   Movement: Present     General:  Alert, oriented and cooperative. Patient is in no acute distress.  Skin: Skin is warm and dry. No rash noted.   Cardiovascular: Normal heart rate noted  Respiratory: Normal respiratory effort, no problems with respiration noted  Abdomen: Soft, gravid, appropriate for gestational age.  Pain/Pressure: Absent     Pelvic: {Blank single:19197::"Cervical exam performed","Cervical exam deferred"}        Extremities: Normal range of motion.  Edema: None  Mental Status: Normal mood and affect. Normal behavior. Normal judgment and thought content.   Assessment and Plan:  Pregnancy: [id number] at 6544w1d  1. Encounter for supervision of other normal pregnancy in third trimester *** - GC/Chlamydia probe amp ([place])[facility] - Culture, beta strep (group b only)  {Blank  single:19197::"Term","Preterm"} labor symptoms and general obstetric precautions including but not limited to vaginal bleeding, contractions, leaking of fluid and fetal movement were reviewed in detail with the patient. Please refer to After Visit Summary for other counseling recommendations.  Return in about 1 week (around 02/25/2018) [place] [facility].  Future Appointments  Date Time Provider Department Center  02/25/2018  9:10 AM Hurshel PartyLeftwich-Kirby, Lisa A, CNM [facility]  03/04/2018  8:15 AM Aviva SignsWilliams, Mustafa Potts L, CNM [facility]    Wynelle BourgeoisMarie Terra Aveni, CNM

## 2018-02-19 LAB — GC/CHLAMYDIA PROBE AMP (~~LOC~~) NOT AT ARMC
Chlamydia: NEGATIVE
Neisseria Gonorrhea: NEGATIVE

## 2018-02-20 ENCOUNTER — Telehealth: Payer: Self-pay | Admitting: *Deleted

## 2018-02-20 ENCOUNTER — Telehealth: Payer: Self-pay | Admitting: Obstetrics & Gynecology

## 2018-02-20 ENCOUNTER — Encounter: Payer: Self-pay | Admitting: Advanced Practice Midwife

## 2018-02-20 MED ORDER — MEDELA DOUBLE BREAST PUMP MISC: 1.0000 | 0 refills | Status: DC | PRN

## 2018-02-20 NOTE — Telephone Encounter (Signed)
Notified patient that her GBS is pending and that is why she has not seen the results yet.  She is worried that because she hasn't gain but 9 lbs this pregnancy that she has IUGR.  On Monday her fundal height was 34cm and her GA is 36.  I explained that if the fundal height has a variance of 2 cm either way.  I attempted to relief her anxiety that the midwives were good that if she was measuring small they would get a growth U/S.  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  She is scheduled on 4/29 for a routine visit and she will discuss further with the midwife.

## 2018-02-20 NOTE — Telephone Encounter (Signed)
Pt called with two concerns. First, she thought her last labs included Group B strep test but she does not see that in her MyChart. Second, she noticed she has gained 9 lbs and is concerned of IUGR or what is reason and her concern for that much weight gain. Call pt [phone].

## 2018-02-20 NOTE — Telephone Encounter (Signed)
Pt called requesting a RX for an electric breast pump to submit to her insurance company

## 2018-02-21 LAB — CULTURE, BETA STREP (GROUP B ONLY)
MICRO NUMBER: 90489920
SPECIMEN QUALITY:: ADEQUATE

## 2018-02-22 ENCOUNTER — Encounter: Payer: Self-pay | Admitting: Obstetrics & Gynecology

## 2018-02-25 ENCOUNTER — Ambulatory Visit (INDEPENDENT_AMBULATORY_CARE_PROVIDER_SITE_OTHER): Payer: Managed Care, Other (non HMO) | Admitting: Advanced Practice Midwife

## 2018-02-25 VITALS — BP 105/68 | HR 80 | Wt 174.0 lb

## 2018-02-25 DIAGNOSIS — Z3483 Encounter for supervision of other normal pregnancy, third trimester: Secondary | ICD-10-CM

## 2018-02-25 DIAGNOSIS — O2613 Low weight gain in pregnancy, third trimester: Secondary | ICD-10-CM

## 2018-02-25 DIAGNOSIS — O34219 Maternal care for unspecified type scar from previous cesarean delivery: Secondary | ICD-10-CM

## 2018-02-25 NOTE — Patient Instructions (Signed)

## 2018-02-25 NOTE — Progress Notes (Signed)
   PRENATAL VISIT NOTE  Subjective:  Vanessa Larson is a 37 y.o. Z6X0960 at [redacted]w[redacted]d being seen today for ongoing prenatal care.  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  Patient reports no complaints.  Contractions: Irritability. Vag. Bleeding: None.  Movement: Present. Denies leaking of fluid.   The following portions of the patient's history were reviewed and updated as appropriate: allergies, current medications, past family history, past medical history, past social history, past surgical history and problem list. Problem list updated.  Objective:   Vitals:   02/25/18 0927  BP: 105/68  Pulse: 80  Weight: 174 lb (78.9 kg)    Fetal Status: Fetal Heart Rate (bpm): 140 Fundal Height: 37 cm Movement: Present     General:  Alert, oriented and cooperative. Patient is in no acute distress.  Skin: Skin is warm and dry. No rash noted.   Cardiovascular: Normal heart rate noted  Respiratory: Normal respiratory effort, no problems with respiration noted  Abdomen: Soft, gravid, appropriate for gestational age.  Pain/Pressure: Absent     Pelvic: Cervical exam deferred        Extremities: Normal range of motion.  Edema: None  Mental Status: Normal mood and affect. Normal behavior. Normal judgment and thought content.   Assessment and Plan:  Pregnancy: A5W0981 at [redacted]w[redacted]d  1. Encounter for supervision of other normal pregnancy in third trimester   2. History of cesarean delivery affecting pregnancy -- With first pregnancy with twins,  successful VBAC x 3.  3. Insufficient weight gain during pregnancy in third trimester --Total weight gain 14 lbs, pt reports 9-10 until last week with 3 lbs weight gain in 1 week.  Fundal height normal today at 37 and [place] EFW 5.5 lbs.  --Pt with dietary changes prior to pregnancy and busy with 5 other children and eating less calories so no cause for concern today.  Encouraged pt to eat regular healthy meals and in between snacks as much as possible with busy schedule.  Term labor symptoms and general obstetric precautions including but not limited to vaginal bleeding, contractions, leaking of fluid and fetal movement were reviewed in detail with the patient. Please refer to After Visit Summary for other counseling recommendations.  Return in about 1 week (around 03/04/2018).  Future Appointments  Date Time Provider Department Center  03/04/2018  8:15 AM Aviva Signs, CNM [facility]    Sharen Counter, CNM

## 2018-03-04 ENCOUNTER — Encounter: Payer: Managed Care, Other (non HMO) | Admitting: Advanced Practice Midwife

## 2018-03-04 ENCOUNTER — Ambulatory Visit (INDEPENDENT_AMBULATORY_CARE_PROVIDER_SITE_OTHER): Payer: Managed Care, Other (non HMO) | Admitting: Advanced Practice Midwife

## 2018-03-04 VITALS — BP 108/71 | HR 88 | Wt 176.0 lb

## 2018-03-04 DIAGNOSIS — Z3483 Encounter for supervision of other normal pregnancy, third trimester: Secondary | ICD-10-CM

## 2018-03-04 NOTE — Patient Instructions (Signed)
Vaginal Delivery Vaginal delivery means that you will give birth by pushing your baby out of your birth canal (vagina). A team of health care providers will help you before, during, and after vaginal delivery. Birth experiences are unique for every woman and every pregnancy, and birth experiences vary depending on where you choose to give birth. What should I do to prepare for my baby's birth? Before your baby is born, it is important to talk with your health care provider about:  Your labor and delivery preferences. These may include: ? Medicines that you may be given. ? How you will manage your pain. This might include non-medical pain relief techniques or injectable pain relief such as epidural analgesia. ? How you and your baby will be monitored during labor and delivery. ? Who may be in the labor and delivery room with you. ? Your feelings about surgical delivery of your baby (cesarean delivery, or C-section) if this becomes necessary. ? Your feelings about receiving donated blood through an IV tube (blood transfusion) if this becomes necessary.  Whether you are able: ? To take pictures or videos of the birth. ? To eat during labor and delivery. ? To move around, walk, or change positions during labor and delivery.  What to expect after your baby is born, such as: ? Whether delayed umbilical cord clamping and cutting is offered. ? Who will care for your baby right after birth. ? Medicines or tests that may be recommended for your baby. ? Whether breastfeeding is supported in your hospital or birth center. ? How long you will be in the hospital or birth center.  How any medical conditions you have may affect your baby or your labor and delivery experience.  To prepare for your baby's birth, you should also:  Attend all of your health care visits before delivery (prenatal visits) as recommended by your health care provider. This is important.  Prepare your home for your baby's  arrival. Make sure that you have: ? Diapers. ? Baby clothing. ? Feeding equipment. ? Safe sleeping arrangements for you and your baby.  Install a car seat in your vehicle. Have your car seat checked by a certified car seat installer to make sure that it is installed safely.  Think about who will help you with your new baby at home for at least the first several weeks after delivery.  What can I expect when I arrive at the birth center or hospital? Once you are in labor and have been admitted into the hospital or birth center, your health care provider may:  Review your pregnancy history and any concerns you have.  Insert an IV tube into one of your veins. This is used to give you fluids and medicines.  Check your blood pressure, pulse, temperature, and heart rate (vital signs).  Check whether your bag of water (amniotic sac) has broken (ruptured).  Talk with you about your birth plan and discuss pain control options.  Monitoring Your health care provider may monitor your contractions (uterine monitoring) and your baby's heart rate (fetal monitoring). You may need to be monitored:  Often, but not continuously (intermittently).  All the time or for long periods at a time (continuously). Continuous monitoring may be needed if: ? You are taking certain medicines, such as medicine to relieve pain or make your contractions stronger. ? You have pregnancy or labor complications.  Monitoring may be done by:  Placing a special stethoscope or a handheld monitoring device on your abdomen to   check your baby's heartbeat, and feeling your abdomen for contractions. This method of monitoring does not continuously record your baby's heartbeat or your contractions.  Placing monitors on your abdomen (external monitors) to record your baby's heartbeat and the frequency and length of contractions. You may not have to wear external monitors all the time.  Placing monitors inside of your uterus  (internal monitors) to record your baby's heartbeat and the frequency, length, and strength of your contractions. ? Your health care provider may use internal monitors if he or she needs more information about the strength of your contractions or your baby's heart rate. ? Internal monitors are put in place by passing a thin, flexible wire through your vagina and into your uterus. Depending on the type of monitor, it may remain in your uterus or on your baby's head until birth. ? Your health care provider will discuss the benefits and risks of internal monitoring with you and will ask for your permission before inserting the monitors.  Telemetry. This is a type of continuous monitoring that can be done with external or internal monitors. Instead of having to stay in bed, you are able to move around during telemetry. Ask your health care provider if telemetry is an option for you.  Physical exam Your health care provider may perform a physical exam. This may include:  Checking whether your baby is positioned: ? With the head toward your vagina (head-down). This is most common. ? With the head toward the top of your uterus (head-up or breech). If your baby is in a breech position, your health care provider may try to turn your baby to a head-down position so you can deliver vaginally. If it does not seem that your baby can be born vaginally, your provider may recommend surgery to deliver your baby. In rare cases, you may be able to deliver vaginally if your baby is head-up (breech delivery). ? Lying sideways (transverse). Babies that are lying sideways cannot be delivered vaginally.  Checking your cervix to determine: ? Whether it is thinning out (effacing). ? Whether it is opening up (dilating). ? How low your baby has moved into your birth canal.  What are the three stages of labor and delivery?  Normal labor and delivery is divided into the following three stages: Stage 1  Stage 1 is the  longest stage of labor, and it can last for hours or days. Stage 1 includes: ? Early labor. This is when contractions may be irregular, or regular and mild. Generally, early labor contractions are more than 10 minutes apart. ? Active labor. This is when contractions get longer, more regular, more frequent, and more intense. ? The transition phase. This is when contractions happen very close together, are very intense, and may last longer than during any other part of labor.  Contractions generally feel mild, infrequent, and irregular at first. They get stronger, more frequent (about every 2-3 minutes), and more regular as you progress from early labor through active labor and transition.  Many women progress through stage 1 naturally, but you may need help to continue making progress. If this happens, your health care provider may talk with you about: ? Rupturing your amniotic sac if it has not ruptured yet. ? Giving you medicine to help make your contractions stronger and more frequent.  Stage 1 ends when your cervix is completely dilated to 4 inches (10 cm) and completely effaced. This happens at the end of the transition phase. Stage 2  Once   your cervix is completely effaced and dilated to 4 inches (10 cm), you may start to feel an urge to push. It is common for the body to naturally take a rest before feeling the urge to push, especially if you received an epidural or certain other pain medicines. This rest period may last for up to 1-2 hours, depending on your unique labor experience.  During stage 2, contractions are generally less painful, because pushing helps relieve contraction pain. Instead of contraction pain, you may feel stretching and burning pain, especially when the widest part of your baby's head passes through the vaginal opening (crowning).  Your health care provider will closely monitor your pushing progress and your baby's progress through the vagina during stage 2.  Your  health care provider may massage the area of skin between your vaginal opening and anus (perineum) or apply warm compresses to your perineum. This helps it stretch as the baby's head starts to crown, which can help prevent perineal tearing. ? In some cases, an incision may be made in your perineum (episiotomy) to allow the baby to pass through the vaginal opening. An episiotomy helps to make the opening of the vagina larger to allow more room for the baby to fit through.  It is very important to breathe and focus so your health care provider can control the delivery of your baby's head. Your health care provider may have you decrease the intensity of your pushing, to help prevent perineal tearing.  After delivery of your baby's head, the shoulders and the rest of the body generally deliver very quickly and without difficulty.  Once your baby is delivered, the umbilical cord may be cut right away, or this may be delayed for 1-2 minutes, depending on your baby's health. This may vary among health care providers, hospitals, and birth centers.  If you and your baby are healthy enough, your baby may be placed on your chest or abdomen to help maintain the baby's temperature and to help you bond with each other. Some mothers and babies start breastfeeding at this time. Your health care team will dry your baby and help keep your baby warm during this time.  Your baby may need immediate care if he or she: ? Showed signs of distress during labor. ? Has a medical condition. ? Was born too early (prematurely). ? Had a bowel movement before birth (meconium). ? Shows signs of difficulty transitioning from being inside the uterus to being outside of the uterus. If you are planning to breastfeed, your health care team will help you begin a feeding. Stage 3  The third stage of labor starts immediately after the birth of your baby and ends after you deliver the placenta. The placenta is an organ that develops  during pregnancy to provide oxygen and nutrients to your baby in the womb.  Delivering the placenta may require some pushing, and you may have mild contractions. Breastfeeding can stimulate contractions to help you deliver the placenta.  After the placenta is delivered, your uterus should tighten (contract) and become firm. This helps to stop bleeding in your uterus. To help your uterus contract and to control bleeding, your health care provider may: ? Give you medicine by injection, through an IV tube, by mouth, or through your rectum (rectally). ? Massage your abdomen or perform a vaginal exam to remove any blood clots that are left in your uterus. ? Empty your bladder by placing a thin, flexible tube (catheter) into your bladder. ? Encourage   you to breastfeed your baby. After labor is over, you and your baby will be monitored closely to ensure that you are both healthy until you are ready to go home. Your health care team will teach you how to care for yourself and your baby. This information is not intended to replace advice given to you by your health care provider. Make sure you discuss any questions you have with your health care provider. Document Released: 07/25/2008 Document Revised: 05/05/2016 Document Reviewed: 10/31/2015 Elsevier Interactive Patient Education  2018 [employer]  

## 2018-03-04 NOTE — Progress Notes (Deleted)
   PRENATAL VISIT NOTE  Subjective:  Vanessa Larson is a 37 y.o. [id number] at [redacted]w[redacted]d being seen today for ongoing prenatal care.  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  Patient reports {sx:[id number]}.  Contractions: Irregular. Vag. Bleeding: None.  Movement: Present. Denies leaking of fluid.   The following portions of the patient's history were reviewed and updated as appropriate: allergies, current medications, past family history, past medical history, past social history, past surgical history and problem list. Problem list updated.  Objective:   Vitals:   03/04/18 1103  BP: 108/71  Pulse: 88  Weight: 176 lb (79.8 kg)    Fetal Status: Fetal Heart Rate (bpm): 138   Movement: Present     General:  Alert, oriented and cooperative. Patient is in no acute distress.  Skin: Skin is warm and dry. No rash noted.   Cardiovascular: Normal heart rate noted  Respiratory: Normal respiratory effort, no problems with respiration noted  Abdomen: Soft, gravid, appropriate for gestational age.  Pain/Pressure: Present     Pelvic: {Blank single:19197::"Cervical exam performed","Cervical exam deferred"}        Extremities: Normal range of motion.  Edema: None  Mental Status: Normal mood and affect. Normal behavior. Normal judgment and thought content.   Assessment and Plan:  Pregnancy: [id number] at [redacted]w[redacted]d  1. Encounter for supervision of other normal pregnancy in third trimester ***  {Blank  single:19197::"Term","Preterm"} labor symptoms and general obstetric precautions including but not limited to vaginal bleeding, contractions, leaking of fluid and fetal movement were reviewed in detail with the patient. Please refer to After Visit Summary for other counseling recommendations.  Return in about 1 week (around 03/11/2018) for [employer].  No future appointments.  Wynelle Bourgeois, CNM

## 2018-03-04 NOTE — Progress Notes (Signed)
   PRENATAL VISIT NOTE  Subjective:  Vanessa Larson is a 37 y.o. [id number] at [redacted]w[redacted]d being seen today for ongoing prenatal care.  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  Patient reports occasional contractions.  Contractions: Irregular. Vag. Bleeding: None.  Movement: Present. Denies leaking of fluid.   The following portions of the patient's history were reviewed and updated as appropriate: allergies, current medications, past family history, past medical history, past social history, past surgical history and problem list. Problem list updated.  Objective:   Vitals:   03/04/18 1103  BP: 108/71  Pulse: 88  Weight: 176 lb (79.8 kg)    Fetal Status: Fetal Heart Rate (bpm): 138   Movement: Present     General:  Alert, oriented and cooperative. Patient is in no acute distress.  Skin: Skin is warm and dry. No rash noted.   Cardiovascular: Normal heart rate noted  Respiratory: Normal respiratory effort, no problems with respiration noted  Abdomen: Soft, gravid, appropriate for gestational age.  Pain/Pressure: Present     Pelvic: Cervical exam performed        Extremities: Normal range of motion.  Edema: None  Mental Status: Normal mood and affect. Normal behavior. Normal judgment and thought content.   Assessment and Plan:  Pregnancy: [id number] at [redacted]w[redacted]d  1. Encounter for supervision of other normal pregnancy in third trimester Reviewed signs of labor Birth Plan reviewed Wants to lift baby as it is born May or may not want Pitocin  for placenta, understands high risk PPH  Term labor symptoms and general obstetric precautions including but not limited to vaginal bleeding, contractions, leaking of fluid and fetal movement were reviewed in detail with the patient. Please refer to After Visit Summary for other counseling recommendations.  Return in about 1 week (around 03/11/2018) for [employer].  RTO 1 week  Wynelle Bourgeois, CNM

## 2018-03-11 ENCOUNTER — Ambulatory Visit (INDEPENDENT_AMBULATORY_CARE_PROVIDER_SITE_OTHER): Payer: Managed Care, Other (non HMO) | Admitting: Advanced Practice Midwife

## 2018-03-11 VITALS — BP 118/75 | HR 84 | Wt 177.0 lb

## 2018-03-11 DIAGNOSIS — O34219 Maternal care for unspecified type scar from previous cesarean delivery: Secondary | ICD-10-CM

## 2018-03-11 DIAGNOSIS — Z3483 Encounter for supervision of other normal pregnancy, third trimester: Secondary | ICD-10-CM

## 2018-03-11 NOTE — Progress Notes (Signed)
   PRENATAL VISIT NOTE  Subjective:  Vanessa Larson is a 37 y.o. [id number] at [redacted]w[redacted]d being seen today for ongoing prenatal care.  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  Patient reports occasional contractions.  Contractions: Regular. Vag. Bleeding: None.  Movement: Present. Denies leaking of fluid.   The following portions of the patient's history were reviewed and updated as appropriate: allergies, current medications, past family history, past medical history, past social history, past surgical history and problem list. Problem list updated.  Objective:   Vitals:   03/11/18 1109  BP: 118/75  Pulse: 84  Weight: 177 lb (80.3 kg)    Fetal Status: Fetal Heart Rate (bpm): 145 Fundal Height: 37 cm Movement: Present     General:  Alert, oriented and cooperative. Patient is in no acute distress.  Skin: Skin is warm and dry. No rash noted.   Cardiovascular: Normal heart rate noted  Respiratory: Normal respiratory effort, no problems with respiration noted  Abdomen: Soft, gravid, appropriate for gestational age.  Pain/Pressure: Present     Pelvic: Cervical exam deferred      Vertex confirmed by [facility]  Extremities: Normal range of motion.  Edema: None  Mental Status: Normal mood and affect. Normal behavior. Normal judgment and thought content.   Assessment and Plan:  Pregnancy: [id number] at [redacted]w[redacted]d  1. Encounter for supervision of other normal pregnancy in third trimester --Some intermittent cramping/contractions, none remaining  regular. --Vertex, EFW 7.5 lbs by [facility]  2. History of cesarean delivery affecting pregnancy --C/S x 1 with twins then VBAC x 3  Term labor symptoms and general obstetric precautions including but not limited to vaginal bleeding, contractions, leaking of fluid and fetal movement were reviewed in detail with the patient. Please refer to After Visit Summary for other counseling recommendations.  Return in about 1 week (around 03/18/2018).  No future appointments.  Sharen Counter, CNM

## 2018-03-11 NOTE — Patient Instructions (Signed)

## 2018-03-12 ENCOUNTER — Inpatient Hospital Stay (HOSPITAL_COMMUNITY)
Admission: AD | Admit: 2018-03-12 | Discharge: 2018-03-14 | DRG: 807 | Disposition: A | Discharge status: Home / Self Care | Payer: Managed Care, Other (non HMO) | Source: Ambulatory Visit | Attending: Family Medicine | Admitting: Family Medicine

## 2018-03-12 ENCOUNTER — Other Ambulatory Visit: Payer: Self-pay

## 2018-03-12 ENCOUNTER — Encounter (HOSPITAL_COMMUNITY): Payer: Self-pay | Admitting: *Deleted

## 2018-03-12 DIAGNOSIS — Z3480 Encounter for supervision of other normal pregnancy, unspecified trimester: Secondary | ICD-10-CM

## 2018-03-12 DIAGNOSIS — D649 Anemia, unspecified: Secondary | ICD-10-CM | POA: Diagnosis present

## 2018-03-12 DIAGNOSIS — O34219 Maternal care for unspecified type scar from previous cesarean delivery: Secondary | ICD-10-CM | POA: Diagnosis not present

## 2018-03-12 DIAGNOSIS — Z283 Underimmunization status: Secondary | ICD-10-CM

## 2018-03-12 DIAGNOSIS — Z2839 Other underimmunization status: Secondary | ICD-10-CM

## 2018-03-12 DIAGNOSIS — O9902 Anemia complicating childbirth: Secondary | ICD-10-CM | POA: Diagnosis present

## 2018-03-12 DIAGNOSIS — Z3A39 39 weeks gestation of pregnancy: Secondary | ICD-10-CM

## 2018-03-12 DIAGNOSIS — O99019 Anemia complicating pregnancy, unspecified trimester: Secondary | ICD-10-CM | POA: Diagnosis present

## 2018-03-12 DIAGNOSIS — O99820 Streptococcus B carrier state complicating pregnancy: Secondary | ICD-10-CM

## 2018-03-12 DIAGNOSIS — O99824 Streptococcus B carrier state complicating childbirth: Secondary | ICD-10-CM | POA: Diagnosis present

## 2018-03-12 DIAGNOSIS — O9989 Other specified diseases and conditions complicating pregnancy, childbirth and the puerperium: Secondary | ICD-10-CM

## 2018-03-12 DIAGNOSIS — Z3483 Encounter for supervision of other normal pregnancy, third trimester: Secondary | ICD-10-CM | POA: Diagnosis present

## 2018-03-12 LAB — CBC
HEMATOCRIT: 37.7 % (ref 36.0–46.0)
HEMOGLOBIN: 12 g/dL (ref 12.0–15.0)
MCH: 26.5 pg (ref 26.0–34.0)
MCHC: 31.8 g/dL (ref 30.0–36.0)
MCV: 83.4 fL (ref 78.0–100.0)
Platelets: 191 10*3/uL (ref 150–400)
RBC: 4.52 MIL/uL (ref 3.87–5.11)
RDW: 16.9 % — AB (ref 11.5–15.5)
WBC: 12.3 10*3/uL — ABNORMAL HIGH (ref 4.0–10.5)

## 2018-03-12 LAB — TYPE AND SCREEN
ABO/RH(D): A POS
ANTIBODY SCREEN: NEGATIVE

## 2018-03-12 MED ORDER — DIPHENHYDRAMINE HCL 25 MG PO CAPS: 25.0000 mg | ORAL_CAPSULE | Freq: Four times a day (QID) | ORAL | Status: DC | PRN

## 2018-03-12 MED ORDER — SODIUM CHLORIDE 0.9 % IV SOLN
2.0000 g | Freq: Once | INTRAVENOUS | Status: AC
  Administered 2018-03-12: 2 g via INTRAVENOUS
  Filled 2018-03-12 (×2): qty 2000

## 2018-03-12 MED ORDER — LACTATED RINGERS IV SOLN
INTRAVENOUS | Status: DC
  Administered 2018-03-12: 13:00:00 via INTRAVENOUS

## 2018-03-12 MED ORDER — FLEET ENEMA 7-19 GM/118ML RE ENEM: 1.0000 | ENEMA | RECTAL | Status: DC | PRN

## 2018-03-12 MED ORDER — WITCH HAZEL-GLYCERIN EX PADS: 1.0000 "application " | MEDICATED_PAD | CUTANEOUS | Status: DC | PRN

## 2018-03-12 MED ORDER — BENZOCAINE-MENTHOL 20-0.5 % EX AERO
1.0000 "application " | INHALATION_SPRAY | CUTANEOUS | Status: DC | PRN
  Administered 2018-03-12: 1 via TOPICAL
  Filled 2018-03-12: qty 56

## 2018-03-12 MED ORDER — OXYCODONE-ACETAMINOPHEN 5-325 MG PO TABS: 1.0000 | ORAL_TABLET | ORAL | Status: DC | PRN

## 2018-03-12 MED ORDER — OXYTOCIN BOLUS FROM INFUSION
500.0000 mL | Freq: Once | INTRAVENOUS | Status: AC
  Administered 2018-03-12: 500 mL via INTRAVENOUS

## 2018-03-12 MED ORDER — ONDANSETRON HCL 4 MG/2ML IJ SOLN: 4.0000 mg | INTRAMUSCULAR | Status: DC | PRN

## 2018-03-12 MED ORDER — ZOLPIDEM TARTRATE 5 MG PO TABS: 5.0000 mg | ORAL_TABLET | Freq: Every evening | ORAL | Status: DC | PRN

## 2018-03-12 MED ORDER — SENNOSIDES-DOCUSATE SODIUM 8.6-50 MG PO TABS
2.0000 | ORAL_TABLET | ORAL | Status: DC
  Administered 2018-03-12: 2 via ORAL
  Filled 2018-03-12 (×2): qty 2

## 2018-03-12 MED ORDER — OXYCODONE-ACETAMINOPHEN 5-325 MG PO TABS: 2.0000 | ORAL_TABLET | ORAL | Status: DC | PRN

## 2018-03-12 MED ORDER — PRENATAL MULTIVITAMIN CH
1.0000 | ORAL_TABLET | Freq: Every day | ORAL | Status: DC
  Administered 2018-03-13 – 2018-03-14 (×2): 1 via ORAL
  Filled 2018-03-12 (×2): qty 1

## 2018-03-12 MED ORDER — ACETAMINOPHEN 325 MG PO TABS: 650.0000 mg | ORAL_TABLET | ORAL | Status: DC | PRN

## 2018-03-12 MED ORDER — LACTATED RINGERS IV SOLN: 500.0000 mL | INTRAVENOUS | Status: DC | PRN

## 2018-03-12 MED ORDER — LIDOCAINE HCL (PF) 1 % IJ SOLN
30.0000 mL | INTRAMUSCULAR | Status: AC | PRN
  Administered 2018-03-12: 30 mL via SUBCUTANEOUS
  Filled 2018-03-12: qty 30

## 2018-03-12 MED ORDER — COCONUT OIL OIL: 1.0000 "application " | TOPICAL_OIL | Status: DC | PRN

## 2018-03-12 MED ORDER — DIBUCAINE 1 % RE OINT: 1.0000 "application " | TOPICAL_OINTMENT | RECTAL | Status: DC | PRN

## 2018-03-12 MED ORDER — ONDANSETRON HCL 4 MG/2ML IJ SOLN: 4.0000 mg | Freq: Four times a day (QID) | INTRAMUSCULAR | Status: DC | PRN

## 2018-03-12 MED ORDER — SIMETHICONE 80 MG PO CHEW: 80.0000 mg | CHEWABLE_TABLET | ORAL | Status: DC | PRN

## 2018-03-12 MED ORDER — SOD CITRATE-CITRIC ACID 500-334 MG/5ML PO SOLN: 30.0000 mL | ORAL | Status: DC | PRN

## 2018-03-12 MED ORDER — TETANUS-DIPHTH-ACELL PERTUSSIS 5-2.5-18.5 LF-MCG/0.5 IM SUSP: 0.5000 mL | Freq: Once | INTRAMUSCULAR | Status: DC

## 2018-03-12 MED ORDER — ONDANSETRON HCL 4 MG PO TABS: 4.0000 mg | ORAL_TABLET | ORAL | Status: DC | PRN

## 2018-03-12 MED ORDER — OXYTOCIN 40 UNITS IN LACTATED RINGERS INFUSION - SIMPLE MED
2.5000 [IU]/h | INTRAVENOUS | Status: DC
  Administered 2018-03-12: 2.5 [IU]/h via INTRAVENOUS
  Filled 2018-03-12: qty 1000

## 2018-03-12 MED ORDER — IBUPROFEN 600 MG PO TABS
600.0000 mg | ORAL_TABLET | Freq: Four times a day (QID) | ORAL | Status: DC
  Administered 2018-03-12 – 2018-03-14 (×8): 600 mg via ORAL
  Filled 2018-03-12 (×8): qty 1

## 2018-03-12 NOTE — Progress Notes (Signed)

## 2018-03-12 NOTE — Progress Notes (Signed)
Pt would like delayed Erythromycin ointment for baby's eyes. Offered ointment multiple times and patient still refusing. Nursing will continue to assess.

## 2018-03-12 NOTE — H&P (Signed)
LABOR AND DELIVERY ADMISSION HISTORY AND PHYSICAL NOTE  Vanessa Larson is a 37 y.o. female [phone] with IUP at [redacted]w[redacted]d by 8wk [place] presenting for SOL.  She reports positive fetal movement. She denies leakage of fluid or vaginal bleeding.  Prenatal History/Complications: PNC at [facility] Pregnancy complications:  - Previous Cesarean Section, successful VBAC x3  -Anemia affecting pregnancy  -Rubella non-immune status  -Depression, not currently on medicine   Past Medical History: Past Medical History:  Diagnosis Date  . Anxiety   . Depression   . H/O abnormal Pap smear   . Ovarian cyst affecting pregnancy in first trimester, antepartum 05/28/2015  . Pregnancy related condition in third trimester 2010   [redacted] weeks gestation complicated by depression/anxiety  . Vaginal Pap smear, abnormal     Past Surgical History: Past Surgical History:  Procedure Laterality Date  . CESAREAN SECTION  2010   x 1  . DILATION AND EVACUATION N/A 12/06/2014   Procedure: DILATATION AND EVACUATION;  Surgeon: Reva Bores, MD;  Location: [facility];  Service: Gynecology;  Laterality: N/A;  . WISDOM TOOTH EXTRACTION      Obstetrical History: OB History    Gravida  6   Para  4   Term  4   Preterm      AB  1   Living  5     SAB  1   TAB      Ectopic      Multiple  1   Live Births  5           Social History: Social History   Socioeconomic History  . Marital status: Married    Spouse name: Not on file  . Number of children: Not on file  . Years of education: Not on file  . Highest education level: Not on file  Occupational History  . Not on file  Social Needs  . Financial resource strain: Not on file  . Food insecurity:    Worry: Not on file    Inability: Not on file  . Transportation needs:    Medical: Not on file    Non-medical: Not on file  Tobacco Use  . Smoking status: Never Smoker  . Smokeless tobacco: Never Used  Substance and Sexual Activity  . Alcohol use: No  . Drug  use: No    Comment: Marijuana three times in college  . Sexual activity: Yes    Partners: Male    Birth control/protection: None    Comment: approx [redacted] wks gestation  Lifestyle  . Physical activity:    Days per week: Not on file    Minutes per session: Not on file  . Stress: Not on file  Relationships  . Social connections:    Talks on phone: Not on file    Gets together: Not on file    Attends religious service: Not on file    Active member of club or organization: Not on file    Attends meetings of clubs or organizations: Not on file    Relationship status: Not on file  Other Topics Concern  . Not on file  Social History Narrative  . Not on file    Family History: Family History  Problem Relation Age of Onset  . Depression Mother   . Stroke Maternal Grandmother   . Cancer Maternal Grandfather     Allergies: No Known Allergies  Medications Prior to Admission  Medication Sig Dispense Refill Last Dose  . Elastic Bandages &  Supports (COMFORT FIT MATERNITY SUPP MED) MISC 1 Device by Does not apply route daily. 1 each 0 Taking  . Misc. Devices (MEDELA DOUBLE BREAST PUMP) MISC 1 Device by Does not apply route as needed. Disp 1 Hospital grade electric breast pump  EDC 03/17/18 1 each 0 Taking  . Prenatal Multivit-Min-Fe-FA (PRENATAL VITAMINS PO) Take by mouth.   Taking  . Probiotic Product (PROBIOTIC DAILY PO) Take by mouth.   Taking     Review of Systems  All systems reviewed and negative except as stated in HPI  Physical Exam Blood pressure 123/66, pulse 76, temperature 98.7 F (37.1 C), temperature source Oral, resp. rate 18, height  (1.676 m), weight 175 lb (79.4 kg), last menstrual period 05/28/2017, SpO2 100 %, currently breastfeeding. General appearance: alert, cooperative and no distress Lungs: clear to auscultation bilaterally Heart: regular rate and rhythm Abdomen: soft, non-tender; bowel sounds normal Extremities: No calf swelling or  tenderness Presentation: cephalic Fetal monitoring: 130/ moderate/ +accel/ no decel  Uterine activity: 2-3 minutes  Dilation: 9 Effacement (%): 100 Station: -1 Exam by:: Vanessa Larson   Prenatal labs: ABO, Rh: A/RH(D) POSITIVE/-- (10/08 0819) Antibody: NO ANTIBODIES DETECTED (10/08 0819) Rubella: 0.94 (10/08 0819) RPR: NON-REACTIVE (02/22 0844)  HBsAg: NON-REACTIVE (10/08 0819)  HIV: NON-REACTIVE (02/22 0844)  GC/Chlamydia: Negative (4/22) GBS:   Positive (4/22) 2 hr Glucola: [phone] Genetic screening:  Declined  Anatomy US: Normal female   Clinic  [facility] Prenatal Labs  Dating  8wk [place] Blood type: A/RH(D) POSITIVE/-- (10/08 0819)   Genetic Screen 1 Screen: declined  AFP:  Quad: Declined   NIPS: Antibody:NO ANTIBODIES DETECTED (10/08 0819)  Anatomic [place]  normal female Rubella: NI  GTT Early:               Third trimester: normal F-89 1-151 2-115 RPR: NON-REACTIVE (10/08 0819)   Flu vaccine Declined HBsAg: NON-REACTIVE (10/08 0819)   TDaP vaccine Undecided. Info given                                HIV: NON-REACTIVE (10/08 0819)   Baby Food Breast                                             GBS:   POSITIVE  Contraception  Pap: 2018 WNL  Circumcision Yes   Pediatrician Vanessa Larson CF:  Support Person Vanessa Larson SMA  Prenatal Classes Previous preg Hgb electrophoresis:    Prenatal Transfer Tool  Maternal Diabetes: No Genetic Screening: Declined Maternal Ultrasounds/Referrals: Normal Fetal Ultrasounds or other Referrals:  None Maternal Substance Abuse:  No Significant Maternal Medications:  None Significant Maternal Lab Results: Lab values include: Group B Strep positive  Results for orders placed or performed during the hospital encounter of 03/12/18 (from the past 24 hour(s))  CBC   Collection Time: 03/12/18 12:20 PM  Result Value Ref Range   WBC 12.3 (H) 4.0 - 10.5 K/uL   RBC 4.52 3.87 - 5.11 MIL/uL   Hemoglobin 12.0 12.0 - 15.0 g/dL   HCT 65.7 84.6 - 96.2 %   MCV 83.4  78.0 - 100.0 fL   MCH 26.5 26.0 - 34.0 pg   MCHC 31.8 30.0 - 36.0 g/dL   RDW 95.2 (H) 84.1 - 32.4 %   Platelets 191 150 - 400 K/uL  Patient Active Problem List   Diagnosis Date Noted  . Normal labor 03/12/2018  . VBAC, delivered 03/12/2018  . Anemia affecting pregnancy, antepartum 12/30/2017  . Conjunctivitis 11/26/2017  . Back pain in pregnancy 10/29/2017  . Rubella non-immune status, antepartum 09/03/2017  . Cholecystitis, acute 05/04/2016  . GBS (group B Streptococcus carrier), +RV culture, currently pregnant 11/22/2015  . Supervision of other normal pregnancy, antepartum 05/28/2015  . History of cesarean delivery affecting pregnancy 10/26/2014  . Anxiety 02/19/2012  . Depression 09/11/2011  . 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 [id number] at [redacted]w[redacted]d here for SOL.   #Labor: Expectant management  #Pain: Plans natural delivery  #FWB: Cat I  #ID:  GBS pos- amp  #MOF: Breast  #MOC:Unsure  #Circ:  Yes   Vanessa Larson, CNM 03/12/2018, 2:22 PM

## 2018-03-13 ENCOUNTER — Telehealth: Payer: Self-pay | Admitting: *Deleted

## 2018-03-13 ENCOUNTER — Encounter (INDEPENDENT_AMBULATORY_CARE_PROVIDER_SITE_OTHER): Payer: Self-pay

## 2018-03-13 LAB — RPR: RPR: NONREACTIVE

## 2018-03-13 LAB — GROUP A STREP BY PCR: Group A Strep by PCR: NOT DETECTED

## 2018-03-13 NOTE — Progress Notes (Signed)
Post Partum Day 1 Subjective: no complaints, up ad lib, voiding, tolerating PO and Feels like throat may be a little sore  Objective: Blood pressure 109/65, pulse 85, temperature 97.8 F (36.6 C), temperature source Oral, resp. rate 18, height  (1.676 m), weight 175 lb (79.4 kg), last menstrual period 05/28/2017, SpO2 100 %, currently breastfeeding.  Physical Exam:  General: alert, cooperative and no distress Lochia: appropriate Uterine Fundus: firm Incision: n/a DVT Evaluation: No evidence of DVT seen on physical exam.  Recent Labs    03/12/18 1220  HGB 12.0  HCT 37.7    Assessment/Plan: Plan for discharge tomorrow and Breastfeeding Rapid Strep today   LOS: 1 day   Wynelle Bourgeois 03/13/2018, 8:04 AM

## 2018-03-13 NOTE — Lactation Note (Signed)
This note was copied from a baby's chart. Lactation Consultation Note  Patient Name: Vanessa Larson [id number] Date: 03/13/2018 Reason for consult: Follow-up assessment  2nd LC visit, mom called for latch assessment  Baby awake and mom placed baby STS, LC offered mom  Pillows and she declined. Mom latching with cradle position, and the  Latch was shallow at 1st until the [facility] eased the baby's chin down  With moms permission to obtain the depth. Per mom comfortable,  And multiple swallows, increased with breast compressions.  Baby released on his own and nipple well rounded.  LC reviewed the benefit of good support to breast feed and also using the Cross cradle prior to the cradle to allow the baby to learn the depth and then switch  Arms to the cradle position. Due to the baby being large ( 9-2.2 oz ) LC recommended  Every feeding offer the baby both breast. Mom was cramping with feeding and LC reminded  Mom to empty bladder prior to feeding, and  If the cramping is still intense ask for a heating \ Pad, warm fluids 1/2 prior to feeding, and pain med ( Prescribed by MD ).  Mom receptive to review of teaching and able to hand express.    Maternal Data Has patient been taught Hand Expression?: Yes Does the patient have breastfeeding experience prior to this delivery?: Yes  Feeding Feeding Type: Breast Fed Length of feed: 8 min(multiple swallows noted )  LATCH Score Latch: Grasps breast easily, tongue down, lips flanged, rhythmical sucking.  Audible Swallowing: Spontaneous and intermittent  Type of Nipple: Everted at rest and after stimulation  Comfort (Breast/Nipple): Soft / non-tender  Hold (Positioning): Assistance needed to correctly position infant at breast and maintain latch.  LATCH Score: 9  Interventions Interventions: Breast feeding basics reviewed;Assisted with latch;Skin to skin;Breast massage;Breast compression;Adjust position;Position options(mom declined pillows  )  Lactation Tools Discussed/Used WIC Program: No   Consult Status Consult Status: PRN Date: 03/14/18 Follow-up type: In-patient    Vanessa Larson Vanessa Larson 03/13/2018, 2:09 PM

## 2018-03-13 NOTE — Lactation Note (Signed)
This note was copied from a baby's chart. Lactation Consultation Note  Patient Name: Vanessa Larson [id number] Date: 03/13/2018 Reason for consult: Initial assessment;Other (Comment);Term(#6  baby , experienced BF )  Baby is 11 hours old LC reviewed and updated the doc flow sheets  Per mom baby last fed at 1100 with swallows sleeping presently on moms chest.  Mom will call with feeding cues for feeding assessment.  Mother informed of post-discharge support and given phone number to the lactation department, including services for phone call assistance; out-patient appointments; and breastfeeding support group. List of other breastfeeding resources in the community given in the handout. Encouraged mother to call for problems or concerns related to breastfeeding.   Maternal Data Has patient been taught Hand Expression?: (plan to review at latch assessment ) Does the patient have breastfeeding experience prior to this delivery?: Yes  Feeding Feeding Type: (baby last fed at 1100 ) Length of feed: 15 min  LATCH Score Latch: Grasps breast easily, tongue down, lips flanged, rhythmical sucking.  Audible Swallowing: A few with stimulation  Type of Nipple: Everted at rest and after stimulation  Comfort (Breast/Nipple): Soft / non-tender  Hold (Positioning): No assistance needed to correctly position infant at breast.  LATCH Score: 9  Interventions Interventions: Breast feeding basics reviewed  Lactation Tools Discussed/Used WIC Program: No   Consult Status Consult Status: Follow-up Date: 03/13/18 Follow-up type: In-patient    Matilde Sprang Darrill Vreeland 03/13/2018, 12:06 PM

## 2018-03-13 NOTE — Telephone Encounter (Signed)
Called to sched pt for 4 week Postpartum. Left a voicemail for return call.

## 2018-03-13 NOTE — Progress Notes (Signed)
CSW received consult for hx of Anxiety/Depression and score of 11 on the [place] Postpartum Scale.  CSW met with MOB to offer support and complete assessment.    When CSW arrived, MOB was resting in bed bonding with infant as evidence by engaging in skin to skin.  MOB appeared comfortable and was receptive to meeting with CSW.  MOB's affect and mood were WNL.  MOB was easy to engage, asked questions, and responded appropriately to CSW questions.   CSW asked about MOB's supporters and MOB shared that MOB feels supported by 2 of MOB's close friends.  MOB shared that FOB is involved however MOB does not feel FOB is sensitive to MOB's MH needs.  CSW offered outpatient counseling resources and MOB was hesitant but accepted the information.  MOB stated, "I have 6 children and it's going to be impossible to get to a counseling session with all the kids." CSW offered in-home counseling and MOB declined the resources.  MOB stated, "I know it sounds crazy but I cannot be in tune with my counselor if I have 6 children in the home." CSW processed the best option for MOB to get her MH needs met and MOB concluded that outpatient counseling will probably be best. MOB agreed to make contact with resources if a need arises. CSW spoke at length about other resources to control MOB's symptoms and MOB stated that MOB and FOB have spoke about the possibility of medication management for MOB if a need arises. MOB denied the use of medications in the past but communicated attending outpatient counseling sessions at 2 different outpatient clinics in [facility] (MOB is not currently an establish patient anywhere).   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 MOB was able to communicate that typically MOB cope with feelings of anxious and feeling overwhelm by following a structured schedule and cleaning. CSW offered other interventions of self care such as meditation, walking,  and engaging in other activities that MOB enjoys.  MOB was receptive to interventions however, due to MOB's limited supports MOB was unable to communicated when MOB will be able to engage in self care.    CSW provided education regarding the baby blues period vs. perinatal mood disorders, discussed treatment and gave resources for mental health follow up if concerns arise.  CSW recommends self-evaluation during the postpartum time period using the New Mom Checklist from Postpartum Progress and encouraged MOB to contact a medical professional if symptoms are noted at any time.  CSW assessed for safety and MOB denied SI, HI, and DV.   CSW provided review of Sudden Infant Death Syndrome (SIDS) precautions.    CSW identifies no further need for intervention and no barriers to discharge at this time.  Laurey Arrow, MSW, LCSW Clinical Social Work [phone]

## 2018-03-14 ENCOUNTER — Encounter (HOSPITAL_COMMUNITY): Payer: Self-pay | Admitting: *Deleted

## 2018-03-14 MED ORDER — IBUPROFEN 600 MG PO TABS: 600.0000 mg | ORAL_TABLET | Freq: Four times a day (QID) | ORAL | 0 refills | Status: DC

## 2018-03-14 NOTE — Discharge Summary (Addendum)
OB Discharge Summary     Patient Name: Vanessa Larson DOB: 06-Jan-1981 MRN: [id number]  Date of admission: 03/12/2018 Delivering MD: Sharyon Cable   Date of discharge: 03/14/2018  Admitting diagnosis: 39WKS CTX 2-3 MINS  Intrauterine pregnancy: [redacted]w[redacted]d     Secondary diagnosis:  Principal Problem:   VBAC, delivered Active Problems:   History of cesarean delivery affecting pregnancy   GBS (group B Streptococcus carrier), +RV culture, currently pregnant   Anemia affecting pregnancy, antepartum   Normal labor  Additional problems: none     Discharge diagnosis: Term Pregnancy Delivered and VBAC                                                                                                Post partum procedures:none  Augmentation: none  Complications: None  Hospital course:  Onset of Labor With Vaginal Delivery     37 y.o. yo [id number] at [redacted]w[redacted]d was admitted in Active Labor on 03/12/2018. Patient had an uncomplicated labor course as follows:  Membrane Rupture Time/Date: 1:14 PM ,03/12/2018   Intrapartum Procedures: Episiotomy: None [1]                                         Lacerations:  None [1]  Patient had a delivery of a Viable infant. 03/12/2018  Information for the patient's newborn:  Zorina, Mallin [[id number]]  Delivery Method: VBAC    Pateint had an uncomplicated postpartum course.  She is ambulating, tolerating a regular diet, passing flatus, and urinating well. Patient is discharged home in stable condition on 03/14/18.   Physical exam  Vitals:   03/12/18 2030 03/13/18 0540 03/13/18 1814 03/14/18 0515  BP: 115/60 109/65 107/75 114/78  Pulse: 66 85 83 82  Resp: Temp: 98.2 F (36.8 C) 97.8 F (36.6 C) 98.3 F (36.8 C) 97.8 F (36.6 C)  TempSrc: Oral Oral Oral Oral  SpO2:    100%  Weight:      Height:       General: alert, cooperative and no distress Lochia: appropriate Uterine Fundus: firm Incision: N/A DVT Evaluation: No evidence of  DVT seen on physical exam. Labs: Lab Results  Component Value Date   WBC 12.3 (H) 03/12/2018   HGB 12.0 03/12/2018   HCT 37.7 03/12/2018   MCV 83.4 03/12/2018   PLT 191 03/12/2018   CMP Latest Ref Rng & Units 09/13/2015  Glucose 65 - 99 mg/dL 79  BUN 7 - 25 mg/dL 6(L)  Creatinine 9.52 - 1.10 mg/dL 8.41(L)  Sodium 244 - 010 mmol/L 138  Potassium 3.5 - 5.3 mmol/L 3.6  Chloride 98 - 110 mmol/L 105  CO2 20 - 31 mmol/L 21  Calcium 8.6 - 10.2 mg/dL 8.3(L)  Total Protein 6.1 - 8.1 g/dL 6.1  Total Bilirubin 0.2 - 1.2 mg/dL 0.4  Alkaline Phos 33 - 115 U/L 79  AST 10 - 30 U/L 13  ALT 6 - 29 U/L 11    Discharge instruction:  per After Visit Summary and "Baby and Me Booklet".  After visit meds:  Allergies as of 03/14/2018   No Known Allergies     Medication List    TAKE these medications   COMFORT FIT MATERNITY SUPP MED Misc 1 Device by Does not apply route daily.   ibuprofen 600 MG tablet Commonly known as:  ADVIL,MOTRIN Take 1 tablet (600 mg total) by mouth every 6 (six) hours.   MEDELA DOUBLE BREAST PUMP Misc 1 Device by Does not apply route as needed. Disp 1 Hospital grade electric breast pump  EDC 03/17/18   PRENATAL VITAMINS PO Take 1 tablet by mouth daily.   PROBIOTIC DAILY PO Take 1 capsule by mouth daily.       Diet: routine diet  Activity: Advance as tolerated. Pelvic rest for 6 weeks.   Outpatient follow up:6 weeks Follow up Appt:No future appointments. Follow up Visit:No follow-ups on file.  Postpartum contraception: Undecided  Newborn Data: Live born female  Birth Weight: 9 lb 7.2 oz (4285 g) APGAR: 7, 9  Newborn Delivery   Birth date/time:  03/12/2018 13:34:00 Delivery type:  VBAC, Spontaneous     Baby Feeding: Breast Disposition:home with mother   03/14/2018 Felisa Bonier, MD, PGY-1 Family Medicine - [facility] [place]  I attempted to round on patient after resident, but she was in the restroom and had left by the time I returned. I  was consulted RE: POC.  Katrinka Blazing, [place], [place] 03/16/2018 4:23 PM

## 2018-03-18 ENCOUNTER — Encounter: Payer: Managed Care, Other (non HMO) | Admitting: Advanced Practice Midwife

## 2018-03-22 ENCOUNTER — Telehealth: Payer: Self-pay

## 2018-03-22 DIAGNOSIS — B379 Candidiasis, unspecified: Secondary | ICD-10-CM

## 2018-03-22 MED ORDER — FLUCONAZOLE 150 MG PO TABS: 150.0000 mg | ORAL_TABLET | Freq: Once | ORAL | 0 refills | Status: AC

## 2018-03-22 NOTE — Telephone Encounter (Signed)
Pt called complaining of "yeast on breast" and a "milk blister". Mariel Aloe, RN instructed me to send in Diflucan and to let the pt to know to watch the milk blister and do not pop it and if it does not improve by next week then she will need to come in for an appointment.

## 2018-03-27 ENCOUNTER — Ambulatory Visit: Payer: Self-pay

## 2018-03-27 ENCOUNTER — Encounter: Payer: Self-pay | Admitting: Obstetrics & Gynecology

## 2018-03-27 ENCOUNTER — Ambulatory Visit (INDEPENDENT_AMBULATORY_CARE_PROVIDER_SITE_OTHER): Payer: Managed Care, Other (non HMO) | Admitting: Obstetrics & Gynecology

## 2018-03-27 VITALS — BP 123/77 | HR 80 | Resp 16 | Ht 66.0 in | Wt 159.0 lb

## 2018-03-27 DIAGNOSIS — IMO0002 Reserved for concepts with insufficient information to code with codable children: Secondary | ICD-10-CM

## 2018-03-27 DIAGNOSIS — O9122 Nonpurulent mastitis associated with the puerperium: Secondary | ICD-10-CM

## 2018-03-27 MED ORDER — CEPHALEXIN 500 MG PO CAPS: 500.0000 mg | ORAL_CAPSULE | Freq: Four times a day (QID) | ORAL | 0 refills | Status: DC

## 2018-03-27 NOTE — Progress Notes (Signed)
   Subjective:    Patient ID: Vanessa Larson, female    DOB: 19-Apr-1981, 37 y.o.   MRN: [id number]  HPI  Patient is a 37 year old female who presents for evaluation of small painful bump on right nipple.  It is been present for 7 to 10 days.  It is gotten smaller and the area has drained the past couple days.  Patient was worried it was HSV.  Patient denies any fever.  And that is not that from the breast in several days.  Patient denies engorgement or generalized breast pain.  Patient was given treatment for yeast by pediatrician which did not help.  Review of Systems  Constitutional: Negative.   Respiratory: Negative.   Cardiovascular: Negative.   Gastrointestinal: Negative.   Musculoskeletal: Negative.   Neurological: Negative.        Objective:   Physical Exam  Constitutional: She is oriented to person, place, and time. She appears well-developed and well-nourished. No distress.  HENT:  Head: Normocephalic and atraumatic.  Eyes: Conjunctivae are normal.  Cardiovascular: Normal rate.  Pulmonary/Chest: Effort normal.    Musculoskeletal: She exhibits no edema.  Neurological: She is alert and oriented to person, place, and time.  Skin: Skin is warm and dry.  Psychiatric: She has a normal mood and affect.  Vitals reviewed.  Vitals:   03/27/18 1002  BP: 123/77  Pulse: 80  Resp: 16  Weight: 159 lb (72.1 kg)  Height:  (1.676 m)    Assessment & Plan:  37 yo female with superficial skin infection.  Not fluctuant.   1.  Keflex 500 qid If fever, enlarges, increased pain--RTC or go to MAU.

## 2018-03-27 NOTE — Lactation Note (Signed)
This note was copied from a baby's chart. 03/27/2018  Name: Vanessa Larson MRN: [id number] Date of Birth: 03/12/2017 Gestational Age: Gestational Age: [redacted]w[redacted]d Birth Weight: 151.2 oz Weight today:    9 pounds 0.4 ounces (4094 grams) with clean new born diaper  Vanessa Larson presents today for feeding assessment due to poor weight gain. 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 This is mom's 6th child and she BF her older children for up to 2 years each.   Mom reports infant is more lethargic than her other children and doesn't eat for very long. Mom has been pumping about every 3 hours and getting 4-8 ounces. Mom is storing her milk.   Mom has a scale at home that measures ounces and pounds. She reports he takes nothing to 2.5 ounces a feeding.   Mom reports she tried to offer a bottle once and he would not take it. Mom reports infant is very sleepy and falls asleep at the breast very easily. Mom reports some clicking with feedings. Mom reports infant spits a lot after feedings also. Mom reports slight tenderness with some latches, nipple is round post feeding.   Infant with thick labial frenulum that inserts at the bottom of the gum ridge. Upper lip is tight and blanches with flanging. He has a sucking blister to mid upper lip. Infant with red ring around upper lip after BF. Infant with posterior lingual frenulum. Infant retracts tongue when mouth opens. He has some limited tongue elevation. Infant lateralizes tongue well.infant with slightly recessed chin and high palate.  Infant clicks occasionally on the breast. Infant takes a while to organize suckle on gloved finger and on the breast. Discussed how restrictions may effect milk supply. Mom was given information on Tongue/lip restrictions and local providers.   Mom with enlarged montgomery tubercle on the right areola area. Mom has seen OB and is starting antibiotics today.   Reviewed with mom that Vanessa Larson seems to be deficient in calories as  evidenced by his behavior and poor weight gain. Enc mom to offer supplement after BF until infant is feeding more actively at the breast. Mom aware she needs to stimulate infant more at the breast.   Mom latched infant to the left breast in the cradle hold. Infant fed for < 5 minutes and transferred 30 ml. He was reawakened and fed on the other breast for < 5 minutes and transferred an additional 32 ml. Infant was syringe fed an additional 10 ml and tolerated well. Infant more alert post feedings. Infant did relatch and feed again on the right breast for < 10 minutes with good swallows noted.   Infant alert and easy to arouse for feeding, he is pretty sleepy on the breast.   Infant to follow up with Pediatrician tomorrow. Infant to follow up with Lactation in 1 week. [facility] not available as mom in [place]. Mom aware of BF Support Groups at [facility]. Mom reports there is a LC in her Peds office also.   Mom reports some feeling of PP blues, she feels like she is handling it well and reports she is aware she can call OB if needed.   Mom reports she has no further questions/concerns at this time. Mom to call with any questions/cocnerns as needed.     General Information: Mother's reason for visit: slow weight gain in the infant Consult: Initial Lactation consultant: [name] RN,IBCLC Breastfeeding experience: Lethargic, sleepy at the breast Maternal medical conditions: History post partum  depression, Infertility(feels like she is doing better, infertility with first child) Maternal medications: Other, Pre-natal vitamin(Cefalexin started today)  Breastfeeding History: Frequency of breast feeding: every 1-3 hours Duration of feeding: 3-15 minutes  Supplementation:                 Pump type: Medela pump in style Pump frequency: every 3 hours Pump volume: 5-10 minutes  Infant Output Assessment: Voids per 24 hours: 6 Urine color: Clear yellow Stools per 24 hours:  4-6 Stool color: Yellow  Breast Assessment: Breast: Soft Nipple: Erect Pain level: 1(occasionally) Pain interventions: Bra  Feeding Assessment: Infant oral assessment: Variance Infant oral assessment comment: Infant with thick labial frenulum that inserts at the bottom of the gum ridge. Upper lip is tight and blanches with flanging. He has a sucking blister to mid upper lip. Infant with red ring around upper lip after BF. Infant with posterior lingual frenulum. Infant retracts tongue when mouth opens. He has some limited tongue elevation. Infant lateralizes tongue well. Infant clicks occasionally on the breast. Infant takes a while to organize suckle on gloved finger and on the breast.  Positioning: Cradle(left breast) Latch: 1 - Repeated attempts needed to sustain latch, nipple held in mouth throughout feeding, stimulation needed to elicit sucking reflex. Audible swallowing: 1 - A few with stimulation Type of nipple: 2 - Everted at rest and after stimulation Comfort: 2 - Soft/non-tender Hold: 2 - No assistance needed to correctly position infant at breast LATCH score: 8 Latch assessment: Deep Lips flanged: Yes Suck assessment: Displays both   Pre-feed weight: 4094 grams Post feed weight: 4124 grams Amount transferred: 30 ml Amount supplemented: 0  Additional Feeding Assessment: Infant oral assessment: Variance Infant oral assessment comment: see above Positioning: Cradle(right breast) Latch: 1 - Repeated attempts neede to sustain latch, nipple held in mouth throughout feeding, stimulation needed to elicit sucking reflex. Audible swallowing: 2 - Spontaneous and intermittent Type of nipple: 2 - Everted at rest and after stimulation Comfort: 2 - Soft/non-tender Hold: 2 - No assistance needed to correctly position infant at breast LATCH score: 9 Latch assessment: Deep Lips flanged: Yes Suck assessment: Displays both Tools: Curved tip syringe Pre-feed weight: 4124 grams Post  feed weight: 4156 grams Amount transferred: 32 ml Amount supplemented: 10  Totals: Total amount transferred: 62 ml Total supplement given: 10 ml Total amount pumped post feed: 0   Plan: 1. Offer infant breast with each feeding with feeding cues making sure infant gets 8 feedings a day 2. Keep infant awake during feeding 3. Massage/compress breast with feeding 4. Offer infant supplement of pumped breast milk after breast feeding with bottle or curved tip syringe. Give him at least 30 ml after each feeding and more if he wants it. 5. Vanessa needs 75-100 ml (2.5-3.3 ounces) or 600-800 ml (20-27 ounces) a day 6. Try a Dr. Theora Gianotti Level 1 nipple or Medela Nipple for feeding 7. Feed infant using the PACE Bottle feeding ([email or web address]) 8. Continue pumping post BF for 15-20 minutes until infant nursing more efficiently to protect supply 9. Keep up with good work 10. Call for assistance as needed [phone] 11. Thank you for allowing me to assist you today 12. Follow up with Lactation in 1 week.    Ed Blalock RN, IBCLC  Silas Flood Hice 03/27/2018, 2:23 PM

## 2018-04-22 ENCOUNTER — Ambulatory Visit (INDEPENDENT_AMBULATORY_CARE_PROVIDER_SITE_OTHER): Payer: Managed Care, Other (non HMO) | Admitting: Advanced Practice Midwife

## 2018-04-22 ENCOUNTER — Ambulatory Visit: Payer: Self-pay

## 2018-04-22 DIAGNOSIS — Z8759 Personal history of other complications of pregnancy, childbirth and the puerperium: Secondary | ICD-10-CM

## 2018-04-22 DIAGNOSIS — Z8659 Personal history of other mental and behavioral disorders: Secondary | ICD-10-CM

## 2018-04-22 DIAGNOSIS — O9279 Other disorders of lactation: Secondary | ICD-10-CM

## 2018-04-22 DIAGNOSIS — Z3009 Encounter for other general counseling and advice on contraception: Secondary | ICD-10-CM

## 2018-04-22 DIAGNOSIS — Z1389 Encounter for screening for other disorder: Secondary | ICD-10-CM

## 2018-04-22 NOTE — Progress Notes (Signed)
Post Partum Exam  Vanessa HartshornMolly M Larson is a 37 y.o. [phone] female who presents for a postpartum visit. She is 6 weeks postpartum following a spontaneous vaginal delivery. I have fully reviewed the prenatal and intrapartum course. The delivery was at 39 gestational weeks.  Anesthesia: none. Postpartum course has been unremarkable. Baby's course has been remarkable for significant difficulties w/ latch thought to be 2/2 tongue-tie. Had procedure last week. Only mild improvement w/ latch so far. Pt is nursing, pumping and bottle feeding pumped mild. Supply is good. She is very worrie that she will not be able to keep her supply up if he does not latch better soon.  Baby is feeding by breast. Bleeding no bleeding. Bowel function is normal. Bladder function is normal. Patient is not sexually active. Contraception method is none. Plans condoms. Husband considering Vasectomy. Postpartum depression screening:neg, but she reports being under a lot of stress caring for 6 children w/ little support. Husband is helpful, but works a lot. Mother does not know how to be helpful.   Was seen 5/29 for sore on breast that resolved spontaneously.   The following portions of the patient's history were reviewed and updated as appropriate: allergies, current medications, past family history, past medical history, past social history, past surgical history and problem list.  Review of Systems Pertinent items are noted in HPI.    Objective:    BP 116/78 mmHg  Pulse 78  Resp 16  Ht 5\' 5"  (1.651 m)  Wt 211 lb (95.709 kg)  BMI 35.11 kg/m2  Breastfeeding? Yes  General:  alert, cooperative, appears stated age and no distress   Breasts:  declined  Lungs: normal effort and rate  Heart:  regular rate  Abdomen: soft, non-tender; bowel sounds normal; no masses,  no organomegaly   Vulva:  declined  Rectal Exam: Not performed.        Assessment:    Normal postpartum exam. Pap smear not done at today's visit.  Not planning  more children, but doesn't;t want hormonal contraception. Discussed Paragard and sterilization. Will use condoms for now. Discussed that they are less effective than other methods discussed. Hx PPD/Anxiety  Plan:   1. Contraception: condoms.   2. Call for Contraception or IBH PRN 3. Follow up in: 1 year or as needed.  4. Discussed ways to delegate responsibilities to support person. Support given for difficulties w/ BF. Encouraged pt to make sure she gets adequate sleep and asks for support especially w/ her Hx anxiety/depression.   Katrinka BlazingSmith, [place], CNM 04/22/2018 11:59 AM

## 2018-04-22 NOTE — Patient Instructions (Signed)

## 2018-04-22 NOTE — Lactation Note (Signed)
This note was copied from a baby's chart. 04/22/2018  Name: Vanessa Larson MRN: [id number] Date of Birth: 03/12/2017 Gestational Age: Gestational Age: [redacted]w[redacted]d Birth Weight: 151.2 oz Weight today:    10 pounds 11.6 ounces (4864 grams) with clean size 1 diaper  Vanessa Larson has gained 770 grams in the last 26 days with an average daily weight gain of 30 grams a day.   Mom reports infant had a tongue and lip revision on 6/17 by Dr. Orland Mustard. 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 Mom reports infant has been refusing to BF more and she started BF followed by pumping and bottle feeding with each feeding. Mom reports infant does better in the laid back position with feeding. Infant chokes on the bottle and the breast. Mom reports infant is more alert at feedings and more alert in between feedings recently. Reviewed with mom that it may take infant 2-4 weeks after revision to note big changes in infant feeding. Reviewed importance of continuing tongue stretches per Dr. Orland Mustard. Gave mom handout and showed how to perform suck training exercises to continue until infant tongue/lip are healed.   Mom reports infant self awakens to feed every 2-3 hours at the breast, mainly taking one breast with each feeding, but may take 2 breasts if wanted. The self awakening has recently improved. He will sleep up to 4 hours for 1 feeding a night. Infant is taking 2-3 ounces by bottle about every 3 hours. Mom is pumping post BF with Medela PIS and getting 4-12 ounces/pumping. Mom has plenty of milk in the freezer. Mom is using the Medela nipple for feeding. Infant does choke some on the bottle. Mom reports infant did not do well with the Avent or Comotomo bottles. Reviewed flow rates to the NB nipples.   Mom reports infant was having green stools. She has decreased her dairy and stools have turned back to yellow. One of her older children has a dairy allergy and breaks out into hives with any exposure.     Reviewed emptying one breast before offering second breast. Reviewed signs of foremilk overload in the infant.   Infant with high palate and recessed chin, infant gags on finger when attempting suck training. Infant with healing to upper lip and under tongue. Infant with some retraction to tongue when opening mouth and on gloved finger. Infant with good tongue lateralization and elevation is improved. Infant with high pitched crowing sound with letdown also.   Mom latched infant to the left breast in the cradle hold. Infant latched well, needing lower lip flanged. Infant choked and pulled off the breast at times most likely due to flow of the breast. He transferred 56 ml in less than 10 minutes. Mom then latched him to the right breast and infant latched easily. He fed for about 5 minutes and transferred 34 ml. Infant was satisfied post feeding.   We discussed positioning and allowing let down to run out into towel if infant having difficulty with letdown. Discussed prepumping off a 1/2 ounce prior to latch if needed also. Reviewed once infant more consistently feeding more efficiently to begin weaning pumping slowly by dropping one pumping every 2-3 days to prevent plugged ducts and mastitis.   Reviewed ways to know infant is getting enough. Mom is concerned she may lose her milk supply. Enc mom to come to support groups for weight checks. Although this is mom's 6 th child, she reports this experience has been different.  She has been working with an LC friend in [place] via phone as needed.   Infant to follow up with Dr. Normand Sloop at [facility] on 6/26. Infant to follow up with Dr. Orland Mustard on 7/2. Infant to follow up with Lactation as needed. Mom to call with questions/concerns as needed.     General Information: Mother's reason for visit: post Tongue and Lip revision on 6/17 by Dr. Orland Mustard Consult: Follow-up Lactation consultant: [name] RN,IBCLC Breastfeeding experience: has had a  difficult time since procedure Maternal medical conditions: Infertility, History post partum depression Maternal medications: Pre-natal vitamin  Breastfeeding History: Frequency of breast feeding: every 2.5-4 hours Duration of feeding: 7-20 minutes  Supplementation: Supplement method: bottle(Medela )         Breast milk volume: 2-3 ounces Breast milk frequency: 6-8 x a day   Pump type: Medela pump in style Pump frequency: every 2-3 hours during the day, less at night Pump volume: 4-12 ounces  Infant Output Assessment: Voids per 24 hours: 8+ Urine color: Clear yellow Stools per 24 hours: 6+ Stool color: Yellow  Breast Assessment: Breast: Filling Nipple: Erect Pain level: 0 Pain interventions: Bra  Feeding Assessment: Infant oral assessment: Variance Infant oral assessment comment: Infant with high palate and recessed chin, infant gags on finger when attempting suck training. Infant with healing to upper lip and under tongue. Infant with some retraction to tongue when opening mouth and on gloved finger. Infant with good tongue lateralization and elevation is improved. Infant with high pitched crowing sound with letdown also.  Positioning: Cradle(left breast) Latch: 2 - Grasps breast easily, tongue down, lips flanged, rhythmical sucking. Audible swallowing: 2 - Spontaneous and intermittent Type of nipple: 2 - Everted at rest and after stimulation Comfort: 2 - Soft/non-tender Hold: 2 - No assistance needed to correctly position infant at breast LATCH score: 10 Latch assessment: Deep Lips flanged: No(needed upper and lower lip flanging) Suck assessment: Nutritive   Pre-feed weight: 4864 grams Post feed weight: 4920 grams Amount transferred: 56 ml Amount supplemented: 0  Additional Feeding Assessment: Infant oral assessment: Variance Infant oral assessment comment: Infant with high palate and recessed chin, infant gags on finger when attempting suck training. Infant  with healing to upper lip and under tongue. Infant with some retraction to tongue when opening mouth and on gloved finger. Infant with good tongue lateralization and elevation is improved. Infant with high pitched crowing sound with letdown also.  Positioning: Cradle(right breast) Latch: 2 - Grasps breast easily, tongue down, lips flanged, rhythmical sucking. Audible swallowing: 2 - Spontaneous and intermittent Type of nipple: 2 - Everted at rest and after stimulation Comfort: 2 - Soft/non-tender Hold: 2 - No assistance needed to correctly position infant at breast LATCH score: 10 Latch assessment: Deep Lips flanged: No(needed lower lip flanging) Suck assessment: Nutritive   Pre-feed weight: 4920 grams Post feed weight: 4954 grams Amount transferred: 34 ml Amount supplemented: 0  Totals: Total amount transferred: 90 ml Total supplement given: 0 Total amount pumped post feed: 0   Plan:  1. Offer infant breast with each feeding with feeding cues  2. Keep infant awake during feeding 3. Massage/compress breast with feeding 4. Offer infant supplement of pumped breast milk after breast feeding if he is still cueing to feed 5. Vanessa needs 90-120 ml (3-4 ounces) or 720-960 ml (24-32 ounces) a day 6. Try a Dr. Theora Gianotti Level 1 nipple or Medela Nipple for feeding 7. Feed infant using the PACE Bottle feeding ([email or web address]) 8.  Continue pumping post BF for 15-20 minutes until infant nursing if breasts do not feel empty and until infant more efficient at the breast again.  9. Continue stretches per Dr. Orland MustardMcMurtry 10 Begin suck training exercises 5-6 x a day until tongue and lip are healed 11. Keep up with good work 12. Call for assistance as needed [phone] 13. Thank you for allowing me to assist you today 14. Follow up with Lactation as needed     Ed BlalockSharon S Demitra Danley RN,  IBCLC                                                        Ed BlalockSharon S Nahlia Hellmann 04/22/2018, 2:19 PM

## 2018-07-26 ENCOUNTER — Telehealth: Payer: Self-pay

## 2018-07-26 NOTE — Telephone Encounter (Signed)
PT called stating that she has a child that has pinworms and the entire household is being treated with Mebendazole. Pt states her child's pediatrician stated that is was unsafe to take while breastfeeding. Pt would like our provider's advice. Donette Larry, CNM said she did research and it is okay for her to take while breastfeeding. Pt is nervous about taking it so Shawna Orleans said she can always pump and dump. Pt wants to know for how long she should pump and dump. Shawna Orleans said she is unsure of how long it stays in your system so she can wait and not take the medication unless she becomes symptomatic. Pt expressed understanding.

## 2019-04-29 ENCOUNTER — Telehealth: Payer: Self-pay | Admitting: *Deleted

## 2019-04-29 MED ORDER — VALACYCLOVIR HCL 1 G PO TABS: 1000.0000 mg | ORAL_TABLET | Freq: Two times a day (BID) | ORAL | 1 refills | Status: DC

## 2019-04-29 NOTE — Telephone Encounter (Signed)
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  He has an appt with his pediatrician today to see if it is HSV.  She has been breast feeding.  She will not have any skin to skin contact for now and just breast pump.  She is requesting a RF on her Valtrex be sent to [facility] [place].

## 2019-08-01 ENCOUNTER — Encounter: Payer: Self-pay | Admitting: *Deleted

## 2019-08-15 ENCOUNTER — Other Ambulatory Visit: Payer: Self-pay

## 2019-08-15 ENCOUNTER — Encounter: Payer: Self-pay | Admitting: Obstetrics and Gynecology

## 2019-08-15 ENCOUNTER — Ambulatory Visit (INDEPENDENT_AMBULATORY_CARE_PROVIDER_SITE_OTHER): Payer: Managed Care, Other (non HMO) | Admitting: Obstetrics and Gynecology

## 2019-08-15 VITALS — BP 125/68 | HR 84 | Ht 66.0 in | Wt 164.0 lb

## 2019-08-15 DIAGNOSIS — Z3202 Encounter for pregnancy test, result negative: Secondary | ICD-10-CM | POA: Diagnosis not present

## 2019-08-15 DIAGNOSIS — Z01419 Encounter for gynecological examination (general) (routine) without abnormal findings: Secondary | ICD-10-CM | POA: Diagnosis not present

## 2019-08-15 DIAGNOSIS — N926 Irregular menstruation, unspecified: Secondary | ICD-10-CM

## 2019-08-15 LAB — POCT URINE PREGNANCY: Preg Test, Ur: NEGATIVE

## 2019-08-15 NOTE — Progress Notes (Signed)
Last pap 06/25/17- negative Pt c/o spotting between periods

## 2019-08-15 NOTE — Progress Notes (Signed)
GYNECOLOGY ANNUAL PREVENTATIVE CARE ENCOUNTER NOTE  History:     Vanessa Larson is a 38 y.o. [phone] female here for a routine annual gynecologic exam.  Current complaints: some abnormal bleeding.   Denies abnormal vaginal bleeding, discharge, pelvic pain, problems with intercourse or other gynecologic concerns. She is still breast feeding. Uses condoms for [facility]. Has had some abnormal scant brown discharge 48 hours after intercourse. No pain.    Gynecologic History Patient's last menstrual period was 07/03/2019. Contraception: condoms Last Pap: 2018. Results were: normal with negative HPV Last mammogram: Na   Obstetric History OB History  Gravida Para Term Preterm AB Living  6 4 4   1 5   SAB TAB Ectopic Multiple Live Births  1     1 5     # Outcome Date GA Lbr Len/2nd Weight Sex Delivery Anes PTL Lv  6 Gravida           5 Term 12/02/15 [redacted]w[redacted]d 17:10 / 00:21 9 lb 6.1 oz (4.255 kg) M VBAC Local  LIV     Birth Comments: WNL  4 SAB 12/06/14 [redacted]w[redacted]d         3 Term 01/27/13 [redacted]w[redacted]d 03:13 / 00:38 8 lb 11.7 oz (3.96 kg) M Vag-Spont None  LIV  2 Term 09/26/11 [redacted]w[redacted]d 72:21 / 00:33 8 lb 6.8 oz (3.822 kg) F Vag-Spont None  LIV     Birth Comments: none  1A Term 03/27/09 [redacted]w[redacted]d  6 lb 3 oz (2.807 kg) M CS-Unspec EPI N LIV  1B Term 03/27/09 [redacted]w[redacted]d  6 lb 7 oz (2.92 kg) M CS-Unspec EPI N LIV    Past Medical History:  Diagnosis Date  . Anxiety   . Depression   . H/O abnormal Pap smear   . Ovarian cyst affecting pregnancy in first trimester, antepartum 05/28/2015  . Pregnancy related condition in third trimester 2010   [redacted] weeks gestation complicated by depression/anxiety  . Vaginal Pap smear, abnormal     Past Surgical History:  Procedure Laterality Date  . CESAREAN SECTION  2010   x 1  . DILATION AND EVACUATION N/A 12/06/2014   Procedure: DILATATION AND EVACUATION;  Surgeon: 2011, MD;  Location: [facility];  Service: Gynecology;  Laterality: N/A;  . WISDOM TOOTH EXTRACTION      Current  Outpatient Medications on File Prior to Visit  Medication Sig Dispense Refill  . cephALEXin (KEFLEX) 500 MG capsule Take 1 capsule (500 mg total) by mouth 4 (four) times daily. (Patient not taking: Reported on 04/22/2018) 28 capsule 0  . Elastic Bandages & Supports (COMFORT FIT MATERNITY SUPP MED) MISC 1 Device by Does not apply route daily. (Patient not taking: Reported on 04/22/2018) 1 each 0  . ibuprofen (ADVIL,MOTRIN) 600 MG tablet Take 1 tablet (600 mg total) by mouth every 6 (six) hours. (Patient not taking: Reported on 04/22/2018) 30 tablet 0  . Misc. Devices (MEDELA DOUBLE BREAST PUMP) MISC 1 Device by Does not apply route as needed. Disp 1 Hospital grade electric breast pump  EDC 03/17/18 (Patient not taking: Reported on 04/22/2018) 1 each 0  . Prenatal Multivit-Min-Fe-FA (PRENATAL VITAMINS PO) Take 1 tablet by mouth daily.     . Probiotic Product (PROBIOTIC DAILY PO) Take 1 capsule by mouth daily.     . valACYclovir (VALTREX) 1000 MG tablet Take 1 tablet (1,000 mg total) by mouth 2 (two) times daily. (Patient not taking: Reported on 08/15/2019) 14 tablet 1   No current facility-administered medications on  file prior to visit.     No Known Allergies  Social History:  reports that she has never smoked. She has never used smokeless tobacco. She reports that she does not drink alcohol or use drugs.  Family History  Problem Relation Age of Onset  . Depression Mother   . Stroke Maternal Grandmother   . Cancer Maternal Grandfather     The following portions of the patient's history were reviewed and updated as appropriate: allergies, current medications, past family history, past medical history, past social history, past surgical history and problem list.  Review of Systems Pertinent items noted in HPI and remainder of comprehensive ROS otherwise negative.  Physical Exam:  BP 125/68   Pulse 84   Ht 5\' 6"  (1.676 m)   Wt 164 lb (74.4 kg)   LMP 07/03/2019   Breastfeeding Yes   BMI  26.47 kg/m  CONSTITUTIONAL: Well-developed, well-nourished female in no acute distress.  HENT:  Normocephalic, atraumatic, External right and left ear normal. Oropharynx is clear and moist EYES: Conjunctivae and EOM are normal. Pupils are equal, round, and reactive to light. No scleral icterus.  NECK: Normal range of motion, supple, no masses.  Normal thyroid.  SKIN: Skin is warm and dry. No rash noted. Not diaphoretic. No erythema. No pallor. MUSCULOSKELETAL: Normal range of motion. No tenderness.  No cyanosis, clubbing, or edema.  2+ distal pulses. NEUROLOGIC: Alert and oriented to person, place, and time. Normal reflexes, muscle tone coordination. No cranial nerve deficit noted. PSYCHIATRIC: Normal mood and affect. Normal behavior. Normal judgment and thought content. CARDIOVASCULAR: Normal heart rate noted, regular rhythm RESPIRATORY: Clear to auscultation bilaterally. Effort and breath sounds normal, no problems with respiration noted. BREASTS: Symmetric in size. No masses, skin changes, nipple drainage, or lymphadenopathy. ABDOMEN: Soft, normal bowel sounds, no distention noted.  No tenderness, rebound or guarding.  PELVIC: Normal appearing external genitalia; normal appearing vaginal mucosa and cervix.  No abnormal discharge noted or brown discharge.  Pap smear not obtained.  Normal uterine size, no other palpable masses, no uterine or adnexal tenderness.   Assessment and Plan:   1. Late period  - POCT urine pregnancy; negative   2. Women's annual routine gynecological examination  Pap not done today.     Routine preventative health maintenance measures emphasized. Please refer to After Visit Summary for other counseling recommendations.    Rasch, Artist Pais, [place] for [employer], [place]

## 2019-08-16 DIAGNOSIS — N926 Irregular menstruation, unspecified: Secondary | ICD-10-CM | POA: Insufficient documentation

## 2020-10-12 ENCOUNTER — Telehealth: Payer: Self-pay

## 2020-10-12 MED ORDER — VALACYCLOVIR HCL 1 G PO TABS: 1000.0000 mg | ORAL_TABLET | Freq: Two times a day (BID) | ORAL | 1 refills | Status: DC

## 2020-10-12 NOTE — Telephone Encounter (Signed)
Pt called needing refill of Valtrex. Refill sent. Pt scheduled annual appt.

## 2020-10-27 ENCOUNTER — Ambulatory Visit: Payer: Managed Care, Other (non HMO) | Admitting: Advanced Practice Midwife

## 2020-12-03 ENCOUNTER — Ambulatory Visit: Payer: Managed Care, Other (non HMO) | Admitting: Certified Nurse Midwife

## 2020-12-21 ENCOUNTER — Encounter: Payer: Self-pay | Admitting: Certified Nurse Midwife

## 2020-12-21 ENCOUNTER — Ambulatory Visit (INDEPENDENT_AMBULATORY_CARE_PROVIDER_SITE_OTHER): Payer: 59 | Admitting: Certified Nurse Midwife

## 2020-12-21 ENCOUNTER — Other Ambulatory Visit (HOSPITAL_COMMUNITY)
Admission: RE | Admit: 2020-12-21 | Discharge: 2020-12-21 | Disposition: A | Discharge status: Home / Self Care | Payer: 59 | Source: Ambulatory Visit | Attending: Certified Nurse Midwife | Admitting: Certified Nurse Midwife

## 2020-12-21 ENCOUNTER — Other Ambulatory Visit: Payer: Self-pay

## 2020-12-21 VITALS — BP 120/71 | HR 67 | Resp 16 | Ht 66.0 in | Wt 155.0 lb

## 2020-12-21 DIAGNOSIS — Z01419 Encounter for gynecological examination (general) (routine) without abnormal findings: Secondary | ICD-10-CM | POA: Insufficient documentation

## 2020-12-21 NOTE — Progress Notes (Signed)
GYNECOLOGY ANNUAL PREVENTATIVE CARE ENCOUNTER NOTE  History:     Vanessa Larson is a 40 y.o. [phone] female here for a routine annual gynecologic exam.  Current complaints: 1 day of heavy vaginal bleeding at start of cycle.   Denies abnormal vaginal bleeding, discharge, pelvic pain, problems with intercourse or other gynecologic concerns.    Gynecologic History Patient's last menstrual period was 11/24/2020. Contraception: none Last Pap: 05/2017. Results were: normal with negative HPV  Obstetric History OB History  Gravida Para Term Preterm AB Living  7 6 6   1 6   SAB IAB Ectopic Multiple Live Births  1     1 6     # Outcome Date GA Lbr Len/2nd Weight Sex Delivery Anes PTL Lv  7 Term 12/02/15 [redacted]w[redacted]d 17:10 / 00:21 9 lb 6.1 oz (4.255 kg) M VBAC Local  LIV     Birth Comments: WNL  6 SAB 12/06/14 [redacted]w[redacted]d         5 Term 01/27/13 [redacted]w[redacted]d 03:13 / 00:38 8 lb 11.7 oz (3.96 kg) M Vag-Spont None  LIV  4 Term 09/26/11 [redacted]w[redacted]d 72:21 / 00:33 8 lb 6.8 oz (3.822 kg) F Vag-Spont None  LIV     Birth Comments: none  3A Term 03/27/09 [redacted]w[redacted]d  6 lb 3 oz (2.807 kg) M CS-Unspec EPI N LIV  3B Term 03/27/09 [redacted]w[redacted]d  6 lb 7 oz (2.92 kg) M CS-Unspec EPI N LIV  2 Term           1 Term             Past Medical History:  Diagnosis Date  . Anxiety   . Depression   . H/O abnormal Pap smear   . Ovarian cyst affecting pregnancy in first trimester, antepartum 05/28/2015  . Pregnancy related condition in third trimester 2010   [redacted] weeks gestation complicated by depression/anxiety  . Vaginal Pap smear, abnormal     Past Surgical History:  Procedure Laterality Date  . CESAREAN SECTION  2010   x 1  . DILATION AND EVACUATION N/A 12/06/2014   Procedure: DILATATION AND EVACUATION;  Surgeon: 2011, MD;  Location: [facility];  Service: Gynecology;  Laterality: N/A;  . WISDOM TOOTH EXTRACTION      Current Outpatient Medications on File Prior to Visit  Medication Sig Dispense Refill  . valACYclovir (VALTREX) 1000 MG  tablet Take 1 tablet (1,000 mg total) by mouth 2 (two) times daily. 14 tablet 1   No current facility-administered medications on file prior to visit.    Allergies  Allergen Reactions  . Latex Itching    Patient states latex condoms cause burning and itching    Social History:  reports that she has never smoked. She has never used smokeless tobacco. She reports that she does not drink alcohol and does not use drugs.  Family History  Problem Relation Age of Onset  . Depression Mother   . Stroke Maternal Grandmother   . Cancer Maternal Grandfather     The following portions of the patient's history were reviewed and updated as appropriate: allergies, current medications, past family history, past medical history, past social history, past surgical history and problem list.  Review of Systems Pertinent items noted in HPI and remainder of comprehensive ROS otherwise negative.  Physical Exam:  BP 120/71   Pulse 67   Resp 16   Ht 5\' 6"  (1.676 m)   Wt 155 lb (70.3 kg)   LMP 11/24/2020  Breastfeeding No   BMI 25.02 kg/m  CONSTITUTIONAL: Well-developed, well-nourished female in no acute distress.  HENT:  Normocephalic, atraumatic, External right and left ear normal.  EYES: Conjunctivae and EOM are normal. Pupils are equal, round, and reactive to light.  NECK: Normal range of motion, supple, no masses.  Normal thyroid.  SKIN: Skin is warm and dry. No rash noted. Not diaphoretic. No erythema. No pallor. MUSCULOSKELETAL: Normal range of motion. No tenderness.  No cyanosis, clubbing, or edema. NEUROLOGIC: Alert and oriented to person, place, and time. Normal reflexes, muscle tone coordination.  PSYCHIATRIC: Normal mood and affect. Normal behavior. Normal judgment and thought content. CARDIOVASCULAR: Normal heart rate noted, regular rhythm RESPIRATORY: Clear to auscultation bilaterally. Effort and breath sounds normal, no problems with respiration noted. BREASTS: Symmetric in size. No  masses, tenderness, skin changes, nipple drainage, or lymphadenopathy bilaterally. Performed in the presence of a chaperone. ABDOMEN: Soft, no distention noted.  No tenderness, rebound or guarding.  PELVIC: Normal appearing external genitalia and urethral meatus; normal appearing vaginal mucosa and cervix.  No abnormal discharge noted.  Pap smear obtained.  Normal uterine size, no other palpable masses, no uterine or adnexal tenderness.  Performed in the presence of a chaperone.   Assessment and Plan:    1. Well woman exam with routine gynecological exam - normal well woman examination  - Cytology - PAP( [place])  Will follow up results of pap smear and manage accordingly. Routine preventative health maintenance measures emphasized. Please refer to After Visit Summary for other counseling recommendations.      Sharyon Cable, CNM [facility] for [employer], [facility]

## 2020-12-22 LAB — CYTOLOGY - PAP
Adequacy: ABSENT
Comment: NEGATIVE
Diagnosis: NEGATIVE
High risk HPV: NEGATIVE

## 2021-12-13 ENCOUNTER — Telehealth: Payer: Self-pay | Admitting: *Deleted

## 2021-12-13 NOTE — Telephone Encounter (Signed)
Patient will call back to schedule annual, at the ER with her son.

## 2022-01-17 ENCOUNTER — Encounter: Payer: Self-pay | Admitting: Advanced Practice Midwife

## 2022-01-17 ENCOUNTER — Ambulatory Visit (INDEPENDENT_AMBULATORY_CARE_PROVIDER_SITE_OTHER): Payer: 59 | Admitting: Advanced Practice Midwife

## 2022-01-17 ENCOUNTER — Other Ambulatory Visit: Payer: Self-pay

## 2022-01-17 VITALS — BP 121/74 | HR 83 | Ht 66.0 in | Wt 167.0 lb

## 2022-01-17 DIAGNOSIS — N939 Abnormal uterine and vaginal bleeding, unspecified: Secondary | ICD-10-CM | POA: Diagnosis not present

## 2022-01-17 DIAGNOSIS — A6000 Herpesviral infection of urogenital system, unspecified: Secondary | ICD-10-CM | POA: Diagnosis not present

## 2022-01-17 DIAGNOSIS — F418 Other specified anxiety disorders: Secondary | ICD-10-CM | POA: Diagnosis not present

## 2022-01-17 DIAGNOSIS — Z01419 Encounter for gynecological examination (general) (routine) without abnormal findings: Secondary | ICD-10-CM | POA: Diagnosis not present

## 2022-01-17 MED ORDER — VALACYCLOVIR HCL 1 G PO TABS: 1000.0000 mg | ORAL_TABLET | Freq: Every day | ORAL | 10 refills | Status: AC

## 2022-01-17 NOTE — Progress Notes (Signed)
? ?Subjective:  ?  ? Vanessa Larson is a 41 y.o. female here at [facility] for a routine exam.  Current complaints: irregular menses, situational anxiety.  Personal health questionnaire reviewed: yes. ? ?Do you have a primary care provider? no ?Do you feel safe at home? yes ? ? ? ?Health Maintenance Due  ?Topic Date Due  ? Hepatitis C Screening  Never done  ?  ? ?Risk factors for chronic health problems: ?Smoking: ?Alchohol/how much: ?Pt BMI: Body mass index is 26.95 kg/m?. ?  ?Gynecologic History ?No LMP recorded. ?Contraception:  natural family planning ?Last Pap: 2022. Results were: normal ?Last mammogram: n/a. ? ?Obstetric History ?OB History  ?Gravida Para Term Preterm AB Living  ?7 6 6   1 6   ?SAB IAB Ectopic Multiple Live Births  ?1     1 6   ?  ?# Outcome Date GA Lbr Len/2nd Weight Sex Delivery Anes PTL Lv  ?7 Term 12/02/15 36w2d17:10 / 00:21 9 lb 6.1 oz (4.255 kg) M VBAC Local  LIV  ?   Birth Comments: WNL  ?6 SAB 12/06/14 147w5d       ?5 Term 01/27/13 3971w0d:13 / 00:38 8 lb 11.7 oz (3.96 kg) M Vag-Spont None  LIV  ?4 Term 09/26/11 40w72w3d21 / 00:33 8 lb 6.8 oz (3.822 kg) F Vag-Spont None  LIV  ?   Birth Comments: none  ?3A Term 03/27/09 37w534w5db 3 oz (2.807 kg) M CS-Unspec EPI N LIV  ?3B Term 03/27/09 77w5d3w5d 7 oz (2.92 kg) M CS-Unspec EPI N LIV  ?2 Term           ?1 Term           ? ? ? ?The following portions of the patient's history were reviewed and updated as appropriate: allergies, current medications, past family history, past medical history, past social history, past surgical history, and problem list. ? ?Review of Systems ?Pertinent items noted in HPI and remainder of comprehensive ROS otherwise negative.  ?  ?Objective:  ? ?BP 121/74   Pulse 83   Ht 5' 6"  (1.676 m)   Wt 167 lb (75.8 kg)   BMI 26.95 kg/m?  ?VS reviewed, nursing note reviewed,  ?Constitutional: well developed, well nourished, no distress ?HEENT: normocephalic ?CV: normal rate ?Pulm/chest wall: normal  effort ?Breast Exam:   performed: right breast normal without mass, skin or nipple changes or axillary nodes, left breast normal without mass, skin or nipple changes or axillary nodes ?Abdomen: soft ?Neuro: alert and oriented x 3 ?Skin: warm, dry ?Psych: affect normal ?Pelvic exam:  ?Bimanual exam: Cervix 0/long/high, firm, anterior, neg CMT, uterus nontender, nonenlarged, adnexa without tenderness, enlargement, or mass  ? ? ?   ?Assessment/Plan:  ? ?1. Situational anxiety ?--Pt reports she and her husband are working on some things right now and she has in increase in anxiety.   ?--She is unsure if she wants to try medication but is interested in talking to someone. ?--She reports she is not in crisis, and has been more anxious in the past than she is now but does feel like she needs more than she is doing for this. ?- Ambulatory referral to [facility] Abnormal uterine bleeding (AUB) ?--This is the longest she has gone without being pregnant or breastfeeding in her adult life.  Usually, she reports periods are 34 day cycles and regular but in the last few months have  been irregular, from 28 to 38 days. They are always heavy but the last 2 have been lighter than usual as well. ? ?- CBC ?- TSH ?- T3, free ?- T4, free ? ?3. Well woman exam with routine gynecological exam ?--Pap normal last year, due in 2027 ?--Bimanual and breast exam wnl today ?--Pt does not have PCP, and desires health maintenance labwork. ? ?- Lipid panel ?- Comp Met (CMET) ?- Vitamin D (25 hydroxy) ? ?4. Recurrent genital herpes simplex ?--Pt with infrequent mild outbreaks but does not have standing Rx. ? ?- valACYclovir (VALTREX) 1000 MG tablet; Take 1 tablet (1,000 mg total) by mouth daily for 5 days.  Dispense: 5 tablet; Refill: 10  ? ? ?Follow up in: 3 months or as needed.  ? ?Fatima Blank, CNM ?4:56 PM   ?

## 2022-01-20 ENCOUNTER — Other Ambulatory Visit: Payer: Self-pay

## 2022-01-20 ENCOUNTER — Other Ambulatory Visit (INDEPENDENT_AMBULATORY_CARE_PROVIDER_SITE_OTHER): Payer: 59

## 2022-01-20 DIAGNOSIS — Z01419 Encounter for gynecological examination (general) (routine) without abnormal findings: Secondary | ICD-10-CM

## 2022-01-20 NOTE — Progress Notes (Signed)
Pt given lab orders and sent to the lab 

## 2022-01-21 LAB — COMPREHENSIVE METABOLIC PANEL
AG Ratio: 1.7 (calc) (ref 1.0–2.5)
ALT: 15 U/L (ref 6–29)
AST: 14 U/L (ref 10–30)
Albumin: 4.3 g/dL (ref 3.6–5.1)
Alkaline phosphatase (APISO): 69 U/L (ref 31–125)
BUN: 15 mg/dL (ref 7–25)
CO2: 28 mmol/L (ref 20–32)
Calcium: 9.3 mg/dL (ref 8.6–10.2)
Chloride: 106 mmol/L (ref 98–110)
Creat: 0.63 mg/dL (ref 0.50–0.97)
Globulin: 2.5 g/dL (calc) (ref 1.9–3.7)
Glucose, Bld: 99 mg/dL (ref 65–99)
Potassium: 4.7 mmol/L (ref 3.5–5.3)
Sodium: 140 mmol/L (ref 135–146)
Total Bilirubin: 0.4 mg/dL (ref 0.2–1.2)
Total Protein: 6.8 g/dL (ref 6.1–8.1)

## 2022-01-21 LAB — LIPID PANEL
Cholesterol: 144 mg/dL (ref <200)
HDL: 44 mg/dL — ABNORMAL LOW (ref >=50)
LDL Cholesterol (Calc): 79 mg/dL (calc)
Non-HDL Cholesterol (Calc): 100 mg/dL (calc) (ref <130)
Total CHOL/HDL Ratio: 3.3 (calc) (ref <5.0)
Triglycerides: 111 mg/dL (ref <150)

## 2022-01-21 LAB — T4, FREE: Free T4: 1.2 ng/dL (ref 0.8–1.8)

## 2022-01-21 LAB — CBC
HCT: 34.9 % — ABNORMAL LOW (ref 35.0–45.0)
Hemoglobin: 11.1 g/dL — ABNORMAL LOW (ref 11.7–15.5)
MCH: 26.9 pg — ABNORMAL LOW (ref 27.0–33.0)
MCHC: 31.8 g/dL — ABNORMAL LOW (ref 32.0–36.0)
MCV: 84.5 fL (ref 80.0–100.0)
MPV: 10.1 fL (ref 7.5–12.5)
Platelets: 235 10*3/uL (ref 140–400)
RBC: 4.13 10*6/uL (ref 3.80–5.10)
RDW: 13.5 % (ref 11.0–15.0)
WBC: 5 10*3/uL (ref 3.8–10.8)

## 2022-01-21 LAB — T3, FREE: T3, Free: 3.1 pg/mL (ref 2.3–4.2)

## 2022-01-21 LAB — TSH: TSH: 1.36 mIU/L

## 2022-04-25 ENCOUNTER — Encounter: Payer: Self-pay | Admitting: Obstetrics and Gynecology

## 2022-04-25 NOTE — Progress Notes (Deleted)
   GYNECOLOGY OFFICE VISIT NOTE  History:   Vanessa Larson is a 41 y.o. [id number] here today for AUB. When she saw Misty Stanley she had periods that were 28-38d. She doesn't have a well established norm because she has been pregnant or nursing for many years.   Since she last saw Misty Stanley, she reports ***  She denies any abnormal vaginal discharge, bleeding, pelvic pain or other concerns.     Past Medical History:  Diagnosis Date   Anxiety    Depression    H/O abnormal Pap smear    Ovarian cyst affecting pregnancy in first trimester, antepartum 05/28/2015   Pregnancy related condition in third trimester 2010   [redacted] weeks gestation complicated by depression/anxiety   Vaginal Pap smear, abnormal     Past Surgical History:  Procedure Laterality Date   CESAREAN SECTION  2010   x 1   DILATION AND EVACUATION N/A 12/06/2014   Procedure: DILATATION AND EVACUATION;  Surgeon: Reva Bores, MD;  Location: [facility];  Service: Gynecology;  Laterality: N/A;   WISDOM TOOTH EXTRACTION      The following portions of the patient's history were reviewed and updated as appropriate: allergies, current medications, past family history, past medical history, past social history, past surgical history and problem list.   Health Maintenance:   Normal pap and negative HRHPV on 11/2020.   Diagnosis  Date Value Ref Range Status  12/21/2020   Final   - Negative for intraepithelial lesion or malignancy (NILM)   Review of Systems:  Pertinent items noted in HPI and remainder of comprehensive ROS otherwise negative.  Physical Exam:  There were no vitals taken for this visit. CONSTITUTIONAL: Well-developed, well-nourished female in no acute distress.  HEENT:  Normocephalic, atraumatic. External right and left ear normal. No scleral icterus.  NECK: Normal range of motion, supple, no masses noted on observation SKIN: No rash noted. Not diaphoretic. No erythema. No pallor. MUSCULOSKELETAL: Normal range of motion. No edema  noted. NEUROLOGIC: Alert and oriented to person, place, and time. Normal muscle tone coordination. No cranial nerve deficit noted. PSYCHIATRIC: Normal mood and affect. Normal behavior. Normal judgment and thought content.  CARDIOVASCULAR: Normal heart rate noted RESPIRATORY: Effort and breath sounds normal, no problems with respiration noted ABDOMEN: No masses noted. No other overt distention noted.    PELVIC: {Blank single:19197::"Deferred","Normal appearing external genitalia; normal urethral meatus; normal appearing vaginal mucosa and cervix.  No abnormal discharge noted.  Normal uterine size, no other palpable masses, no uterine or adnexal tenderness. Performed in the presence of a chaperone"}  Labs and Imaging No results found for this or any previous visit (from the past 168 hour(s)). No results found.  Assessment and Plan:   1. Abnormal uterine bleeding (AUB) ***    Diagnoses and all orders for this visit:  Abnormal uterine bleeding (AUB)    Routine preventative health maintenance measures emphasized. Please refer to After Visit Summary for other counseling recommendations.   No follow-ups on file.  Milas Hock, MD, FACOG Obstetrician & Gynecologist, [facility] for [facility], [facility]

## 2022-04-27 ENCOUNTER — Ambulatory Visit: Payer: 59 | Admitting: Obstetrics and Gynecology

## 2022-04-27 DIAGNOSIS — N939 Abnormal uterine and vaginal bleeding, unspecified: Secondary | ICD-10-CM

## 2022-06-19 ENCOUNTER — Ambulatory Visit: Payer: 59 | Admitting: Obstetrics and Gynecology

## 2022-07-10 ENCOUNTER — Ambulatory Visit (INDEPENDENT_AMBULATORY_CARE_PROVIDER_SITE_OTHER): Payer: 59 | Admitting: Obstetrics & Gynecology

## 2022-07-10 ENCOUNTER — Encounter: Payer: Self-pay | Admitting: Obstetrics & Gynecology

## 2022-07-10 ENCOUNTER — Telehealth: Payer: Self-pay | Admitting: *Deleted

## 2022-07-10 VITALS — BP 136/90 | HR 84 | Ht 66.0 in | Wt 157.0 lb

## 2022-07-10 DIAGNOSIS — N926 Irregular menstruation, unspecified: Secondary | ICD-10-CM

## 2022-07-10 DIAGNOSIS — N921 Excessive and frequent menstruation with irregular cycle: Secondary | ICD-10-CM | POA: Diagnosis not present

## 2022-07-10 NOTE — Telephone Encounter (Signed)
Patient will call and schedule IUD insertion after scheduling U/S.

## 2022-07-10 NOTE — Progress Notes (Signed)
   Subjective:    Patient ID: Vanessa Larson, female    DOB: 1981/10/24, 41 y.o.   MRN: [id number]  HPI  41 yo [id number] with menorrhagia and irregular length cycels.  Last birth 2017 with 2 years of breast feeding.  Used NFP, condoms or coitus interuptus.    May 9-13 (heavy first 2 days then light. June 6-10 July 6-10 July 29 Aug 2 Aug 23-27  Patient is recently separated from her husband.  They have had marital difficulties for 8 years.  They have used pastoral counseling and outside counsel.  They are working through the details.  She is very stressed.  Her children are no longer homeschooled and have entered a a [employer] school.  She is now working.  Through all this she seems very grounded.  She is not exhibiting symptoms of depression.  Review of Systems  Constitutional: Negative.   Respiratory: Negative.    Cardiovascular: Negative.   Gastrointestinal: Negative.   Genitourinary:  Positive for menstrual problem and vaginal bleeding.  Psychiatric/Behavioral: Negative.  Negative for agitation, behavioral problems and dysphoric mood.        Objective:   Physical Exam Vitals reviewed.  Constitutional:      General: She is not in acute distress.    Appearance: She is well-developed.  HENT:     Head: Normocephalic and atraumatic.  Eyes:     Conjunctiva/sclera: Conjunctivae normal.  Cardiovascular:     Rate and Rhythm: Normal rate.  Pulmonary:     Effort: Pulmonary effort is normal.  Skin:    General: Skin is warm and dry.  Neurological:     Mental Status: She is alert and oriented to person, place, and time.  Psychiatric:        Mood and Affect: Mood normal.    Vitals:   07/10/22 1048  BP: (!) 136/90  Pulse: 84  Weight: 157 lb (71.2 kg)  Height: 5\' 6"  (1.676 m)      Assessment & Plan:  41 year old G7 para 6-0-1-6 female who presents with heavy menstrual cycle the first day of flow.  They are also varying in length.  She had a couple that were 21 days  apart.  She is interested in cycle control and is interested in the Mirena IUD.  We will first get an ultrasound to look at her's uterine contour and endometrial stripe.  She will do this just after her next menstrual cycle which is coming up.  After that ultrasound we will see her for an IUD insertion.  35 minutes was spent during this patient visit.  We were thoroughly reviewed her menstrual cycles and options for cycle control.  We also talked about her separation and the stress this is causing.

## 2022-09-14 ENCOUNTER — Ambulatory Visit (INDEPENDENT_AMBULATORY_CARE_PROVIDER_SITE_OTHER): Payer: 59 | Admitting: Obstetrics and Gynecology

## 2022-09-14 ENCOUNTER — Encounter: Payer: Self-pay | Admitting: Obstetrics and Gynecology

## 2022-09-14 VITALS — BP 113/78 | HR 79 | Resp 16 | Ht 66.0 in | Wt 159.0 lb

## 2022-09-14 DIAGNOSIS — Z30430 Encounter for insertion of intrauterine contraceptive device: Secondary | ICD-10-CM

## 2022-09-14 DIAGNOSIS — N921 Excessive and frequent menstruation with irregular cycle: Secondary | ICD-10-CM | POA: Diagnosis not present

## 2022-09-14 LAB — POCT URINE PREGNANCY: Preg Test, Ur: NEGATIVE

## 2022-09-14 MED ORDER — LEVONORGESTREL 20 MCG/DAY IU IUD
1.0000 | INTRAUTERINE_SYSTEM | Freq: Once | INTRAUTERINE | Status: AC
  Administered 2022-09-14: 1 via INTRAUTERINE

## 2022-09-14 NOTE — Progress Notes (Signed)
    GYNECOLOGY OFFICE PROCEDURE NOTE  Vanessa Larson is a 41 y.o. [id number] here for IUD insertion. No GYN concerns. Cycles have resolved. We discussed the mirena vs the paragard and the pros and cons of each.   IUD Insertion Procedure Note Procedure: IUD insertion with Mirena UPT: Negative GC/CT testing: Offered and declines  Patient identified.  Risks, benefits and alternatives of procedure were discussed including irregular bleeding, cramping, infection, malpositioning or misplacement of the IUD outside the uterus which may require further procedure such as laparoscopy. Also discussed >99% contraception efficacy, increased risk of ectopic pregnancy with failure of method.   Emphasized that this did not protect against STIs, condoms recommended during all sexual encounters. Consent signed. Time out performed.   Speculum inserted and cervix visualized, prepped with 3 swabs of betadine.   Grasped with a single tooth tenaculum and IUD then inserted without difficulty per manufacturer's instructions and strings cut to 3 cm below cervical os and all instruments removed. Pt tolerated well with minimal pain and bleeding.   Discussed concerning signs/symptoms and to call if heavy bleeding, severe abdominal pain, or fever in the following 3 weeks. Manufacturer pamphlet/patient information given. Reviewed timing of efficacy for contraception and to use an alternative form of birth control until that time.   Milas Hock, MD, FACOG Obstetrician & Gynecologist, [facility] for [facility], [facility]

## 2023-01-16 ENCOUNTER — Ambulatory Visit: Payer: 59 | Admitting: Certified Nurse Midwife

## 2023-02-16 ENCOUNTER — Ambulatory Visit (INDEPENDENT_AMBULATORY_CARE_PROVIDER_SITE_OTHER): Payer: 59 | Admitting: Family Medicine

## 2023-02-16 ENCOUNTER — Encounter: Payer: Self-pay | Admitting: Family Medicine

## 2023-02-16 VITALS — BP 112/72 | HR 91 | Temp 98.8°F | Resp 20 | Ht 66.0 in | Wt 158.2 lb

## 2023-02-16 DIAGNOSIS — T3695XA Adverse effect of unspecified systemic antibiotic, initial encounter: Secondary | ICD-10-CM

## 2023-02-16 DIAGNOSIS — Z7689 Persons encountering health services in other specified circumstances: Secondary | ICD-10-CM

## 2023-02-16 DIAGNOSIS — N3001 Acute cystitis with hematuria: Secondary | ICD-10-CM | POA: Diagnosis not present

## 2023-02-16 DIAGNOSIS — B379 Candidiasis, unspecified: Secondary | ICD-10-CM | POA: Insufficient documentation

## 2023-02-16 DIAGNOSIS — R300 Dysuria: Secondary | ICD-10-CM

## 2023-02-16 LAB — POCT URINALYSIS DIP (CLINITEK)
Bilirubin, UA: NEGATIVE
Glucose, UA: NEGATIVE mg/dL
Ketones, POC UA: NEGATIVE mg/dL
Nitrite, UA: POSITIVE — AB
POC PROTEIN,UA: 100 — AB
Spec Grav, UA: 1.02 (ref 1.010–1.025)
Urobilinogen, UA: 0.2 E.U./dL
pH, UA: 6 (ref 5.0–8.0)

## 2023-02-16 MED ORDER — CEPHALEXIN 500 MG PO CAPS: 500.0000 mg | ORAL_CAPSULE | Freq: Two times a day (BID) | ORAL | 0 refills | Status: DC

## 2023-02-16 MED ORDER — FLUCONAZOLE 150 MG PO TABS: ORAL_TABLET | ORAL | 0 refills | Status: DC

## 2023-02-16 NOTE — Progress Notes (Signed)
New Patient Office Visit  Subjective    Patient ID: Vanessa Larson, female    DOB: 02/02/1981  Age: 42 y.o. MRN: [id number]  CC:  Chief Complaint  Patient presents with   Establish Care    Patient is here today to establish care with new provider and also thinks she may have an UTI, She states the symptoms started about one week ago. She has taken an AZO , She states the symptoms stopped after one day but returned.  She also says she had bloating feeling in her lower abdominal and that she does have the urgency and frequency without any burning    HPI Vanessa Larson presents to establish care with this practice. She is new to me. She is having some menstrual bleeding/spotting, has IUD to note. Presents today for possible UTI. Symptoms include: heaviness and bloating, urgency and frequency with burning. Has had symptoms for one week. No fever, chills, or flank pain.   Has tried AZO with some relief.    Outpatient Encounter Medications as of 02/16/2023  Medication Sig   cephALEXin (KEFLEX) 500 MG capsule Take 1 capsule (500 mg total) by mouth 2 (two) times daily.   fluconazole (DIFLUCAN) 150 MG tablet Take one tablet by mouth, may repeat in 3 days if symptoms persists.   valACYclovir (VALTREX) 1000 MG tablet Take 1,000 mg by mouth daily. (Patient not taking: Reported on 09/14/2022)   No facility-administered encounter medications on file as of 02/16/2023.    Past Medical History:  Diagnosis Date   Anxiety    Depression    H/O abnormal Pap smear     Past Surgical History:  Procedure Laterality Date   CESAREAN SECTION  2010   x 1   DILATION AND EVACUATION N/A 12/06/2014   Procedure: DILATATION AND EVACUATION;  Surgeon: Reva Bores, MD;  Location: [facility];  Service: Gynecology;  Laterality: N/A;   WISDOM TOOTH EXTRACTION      Family History  Problem Relation Age of Onset   Depression Mother    Stroke Maternal Grandmother    Cancer Maternal Grandfather     Social History    Socioeconomic History   Marital status: Married    Spouse name: Not on file   Number of children: Not on file   Years of education: Not on file   Highest education level: Not on file  Occupational History   Not on file  Tobacco Use   Smoking status: Never   Smokeless tobacco: Never  Vaping Use   Vaping Use: Never used  Substance and Sexual Activity   Alcohol use: No   Drug use: No    Comment: Marijuana three times in college   Sexual activity: Yes    Partners: Male    Birth control/protection: None  Other Topics Concern   Not on file  Social History Narrative   Not on file   Social Determinants of Health   Financial Resource Strain: Not on file  Food Insecurity: Not on file  Transportation Needs: Not on file  Physical Activity: Not on file  Stress: Not on file  Social Connections: Not on file  Intimate Partner Violence: Not on file    Review of Systems  Constitutional:  Negative for chills and fever.  Genitourinary:  Positive for dysuria, frequency and urgency. Negative for flank pain and hematuria.        Objective    BP 112/72   Pulse 91   Temp 98.8 F (37.1  C) (Oral)   Resp 20   Ht  (1.676 m)   Wt 158 lb 3.2 oz (71.8 kg)   SpO2 100%   BMI 25.53 kg/m   Physical Exam Vitals and nursing note reviewed.  Constitutional:      General: She is not in acute distress.    Appearance: Normal appearance. She is normal weight.  Cardiovascular:     Rate and Rhythm: Regular rhythm.     Heart sounds: Normal heart sounds.  Pulmonary:     Effort: Pulmonary effort is normal.     Breath sounds: Normal breath sounds.  Abdominal:     General: Bowel sounds are normal.     Palpations: Abdomen is soft.     Tenderness: There is no abdominal tenderness. There is no right CVA tenderness or left CVA tenderness.  Skin:    General: Skin is warm and dry.     Capillary Refill: Capillary refill takes less than 2 seconds.  Neurological:     General: No focal  deficit present.     Mental Status: She is alert. Mental status is at baseline.  Psychiatric:        Mood and Affect: Mood normal.        Behavior: Behavior normal.        Thought Content: Thought content normal.        Judgment: Judgment normal.         Assessment & Plan:   Problem List Items Addressed This Visit     Establishing care with new doctor, encounter for - Primary   Dysuria   Relevant Orders   POCT URINALYSIS DIP (CLINITEK)   Acute cystitis with hematuria  Urine positive for blood (could be menstrual bleeding),  nitrites, and leukocytes. Having  urgency,  frequency, and some dysuria.  No fever or flank pain. Symptoms present for one week. Will start Keflex 500 mg BID for 7 days. Increase water intake. Follow-up if symptoms do not resolve.    Relevant Medications   cephALEXin (KEFLEX) 500 MG capsule   Antibiotic-induced yeast infection   Relevant Medications   cephALEXin (KEFLEX) 500 MG capsule   fluconazole (DIFLUCAN) 150 MG tablet  Agrees with plan of care discussed.  Questions answered.   Return in about 2 weeks (around 03/02/2023) for cpe with labs .   Vanessa Olive, FNP

## 2023-02-16 NOTE — Patient Instructions (Signed)
You were prescribed an antibiotic today. Please complete the full course.  Acetaminophen or ibuprofen for fever according to package instructions, do not exceed recommended doses.  Adequate fluids to avoid dehydration. Lots of rest while you are recovering.  Follow-up if your symptoms do not improve.     

## 2023-03-05 ENCOUNTER — Encounter: Payer: Self-pay | Admitting: Family Medicine

## 2023-03-05 ENCOUNTER — Ambulatory Visit (INDEPENDENT_AMBULATORY_CARE_PROVIDER_SITE_OTHER): Payer: 59 | Admitting: Family Medicine

## 2023-03-05 VITALS — BP 108/77 | HR 78 | Temp 98.3°F | Ht 66.0 in | Wt 160.3 lb

## 2023-03-05 DIAGNOSIS — K921 Melena: Secondary | ICD-10-CM | POA: Insufficient documentation

## 2023-03-05 DIAGNOSIS — Z13228 Encounter for screening for other metabolic disorders: Secondary | ICD-10-CM

## 2023-03-05 DIAGNOSIS — Z789 Other specified health status: Secondary | ICD-10-CM | POA: Insufficient documentation

## 2023-03-05 DIAGNOSIS — R5382 Chronic fatigue, unspecified: Secondary | ICD-10-CM | POA: Insufficient documentation

## 2023-03-05 DIAGNOSIS — Z0000 Encounter for general adult medical examination without abnormal findings: Secondary | ICD-10-CM | POA: Diagnosis not present

## 2023-03-05 DIAGNOSIS — R635 Abnormal weight gain: Secondary | ICD-10-CM

## 2023-03-05 DIAGNOSIS — Z13220 Encounter for screening for lipoid disorders: Secondary | ICD-10-CM

## 2023-03-05 NOTE — Progress Notes (Signed)
Complete physical exam  Patient: Vanessa Larson   DOB: Jun 07, 1981   42 y.o. Female  MRN: [id number]  Subjective:    Chief Complaint  Patient presents with   Annual Exam    Pt is fasting.     BRIGHTYN ZERR is a 41 y.o. female who presents today for a complete physical exam. She reports consuming a general diet. Gym/ health club routine includes cardio and mod to heavy weightlifting. She generally feels well. She reports sleeping well. She does not have additional problems to discuss today.    Most recent fall risk assessment:    02/16/2023   11:27 AM  Fall Risk   Falls in the past year? 0  Number falls in past yr: 0  Injury with Fall? 0  Risk for fall due to : No Fall Risks  Follow up Falls evaluation completed     Most recent depression screenings:    02/16/2023   11:27 AM  PHQ 2/9 Scores  PHQ - 2 Score 1  PHQ- 9 Score 3  Going through separation. Sees therapist. Denies thoughts of self harm.   Vision:Within last year and Dental: No current dental problems and Receives regular dental care    Patient Care Team: [name], FNP as PCP - General (Family Medicine) Allie Bossier, MD as Obstetrician (Obstetrics and Gynecology)   Outpatient Medications Prior to Visit  Medication Sig   valACYclovir (VALTREX) 1000 MG tablet Take 1,000 mg by mouth daily.   cephALEXin (KEFLEX) 500 MG capsule Take 1 capsule (500 mg total) by mouth 2 (two) times daily. (Patient not taking: Reported on 03/05/2023)   fluconazole (DIFLUCAN) 150 MG tablet Take one tablet by mouth, may repeat in 3 days if symptoms persists. (Patient not taking: Reported on 03/05/2023)   No facility-administered medications prior to visit.    Review of Systems  Constitutional:  Positive for malaise/fatigue (history of anemia, vegan diet for 2-3  months). Negative for chills, fever and weight loss (hs gained weight despite eating healthy and gym routine).  Eyes:  Negative for blurred vision and double vision.   Respiratory:  Negative for shortness of breath.   Cardiovascular:  Negative for chest pain and leg swelling.  Gastrointestinal:  Positive for blood in stool, constipation and diarrhea. Negative for nausea and vomiting.  Neurological:  Positive for tingling and headaches. Negative for dizziness.       Has passed out 3 times in past year, attributes this to low blood sugar. Last episode 4 months ago. Eats food and takes care of it.   Psychiatric/Behavioral:  Negative for depression and suicidal ideas.        Going through separation and seeing therapist.           Objective:     BP 108/77   Pulse 78   Temp 98.3 F (36.8 C) (Oral)   Ht 5\' 6"  (1.676 m)   Wt 160 lb 4.8 oz (72.7 kg)   LMP  (LMP Unknown)   SpO2 97%   BMI 25.87 kg/m  BP Readings from Last 3 Encounters:  03/05/23 108/77  02/16/23 112/72  09/14/22 113/78      Physical Exam Vitals and nursing note reviewed.  Constitutional:      General: She is not in acute distress.    Appearance: Normal appearance. She is normal weight.  HENT:     Right Ear: Tympanic membrane normal.     Left Ear: Tympanic membrane normal.  Nose: Nose normal.     Mouth/Throat:     Mouth: Mucous membranes are moist.     Pharynx: Oropharynx is clear.  Eyes:     Extraocular Movements: Extraocular movements intact.  Neck:     Thyroid: No thyroid tenderness.  Cardiovascular:     Rate and Rhythm: Normal rate and regular rhythm.     Pulses: Normal pulses.     Heart sounds: Normal heart sounds.  Pulmonary:     Effort: Pulmonary effort is normal.     Breath sounds: Normal breath sounds.  Abdominal:     General: Bowel sounds are normal. There is no distension.     Palpations: Abdomen is soft.     Tenderness: There is no abdominal tenderness.  Musculoskeletal:        General: Normal range of motion.     Right lower leg: No edema.     Left lower leg: No edema.  Lymphadenopathy:     Cervical:     Right cervical: No superficial  cervical adenopathy.    Left cervical: No superficial cervical adenopathy.  Skin:    General: Skin is warm and dry.     Capillary Refill: Capillary refill takes less than 2 seconds.  Neurological:     General: No focal deficit present.     Mental Status: She is alert. Mental status is at baseline.  Psychiatric:        Mood and Affect: Mood normal.        Behavior: Behavior normal.        Thought Content: Thought content normal.        Judgment: Judgment normal.     No results found for any visits on 03/05/23.     Assessment & Plan:    Routine Health Maintenance and Physical Exam  Immunization History  Administered Date(s) Administered   Tdap 09/27/2011, 09/13/2015    Health Maintenance  Topic Date Due   Hepatitis C Screening  04/02/2023 (Originally 07/15/2000)   PAP SMEAR-Modifier  12/22/2023   DTaP/Tdap/Td (3 - Td or Tdap) 09/12/2025   HIV Screening  Completed   HPV VACCINES  Aged Out   INFLUENZA VACCINE  Discontinued   COVID-19 Vaccine  Discontinued    Discussed health benefits of physical activity, and encouraged her to engage in regular exercise appropriate for her age and condition.  Annual physical exam -     CBC with Differential/Platelet -     Comprehensive metabolic panel -     Hemoglobin A1c -     Lipid panel -     TSH + free T4  Chronic fatigue -     CBC with Differential/Platelet -     Comprehensive metabolic panel -     Hemoglobin A1c -     TSH + free T4  Vegan diet -     B12 and Folate Panel  Encounter for lipid screening for cardiovascular disease -     Lipid panel  Encounter for screening for metabolic disorder -     Hemoglobin A1c -     TSH + free T4  Weight gain -     Hemoglobin A1c -     TSH + free T4  Blood in stool  Reports seeing blood in stool several times with alternating diarrhea and constipation. Referral placed for further evaluation by specialist. No abdominal pain.  -     Ambulatory referral to  Gastroenterology  History of getting warm and experiencing tingling and passing out.  Has attributed this to low blood sugar, never diagnosed. This happened 4 months ago. She will monitor this and if it happens again, she will document details of the day/ dietary intake and make follow-up appointment with PCP for next steps. Discussed eating small frequent meals with complex carbs, protein, and healthy fats.     Routine labs ordered. HCM reviewed/discussed. Pap per GYN, she has IUD in place. Last pap 12/01/20. Anticipatory guidance regarding healthy weight, lifestyle and choices given. Recommend healthy diet.  Recommend approximately 150 minutes/week of moderate intensity exercise. Resistance training is good for building muscles and for bone health. Muscle mass helps to increase our metabolism and to burn more calories at rest.  Limit alcohol consumption: no more than one drink per day for women and 2 drinks per day for me. Recommend regular dental and vision exams. Always use seatbelt/lap and shoulder restraints. Recommend using smoke alarms and checking batteries at least twice a year. Recommend using sunscreen when outside.  Please know that I am here to help you with all of your health care goals and am happy to work with you to find a solution that works best for you.  The greatest advice I have received with any changes in life are to take it one step at a time, that even means if all you can focus on is the next 60 seconds, then do that and celebrate your victories.  With any changes in life, you will have set backs, and that is OK. The important thing to remember is, if you have a set back, it is not a failure, it is an opportunity to try again! Agrees with plan of care discussed.  Questions answered.       Return in about 1 year (around 03/04/2024) for cpe with labs .     [name], FNP

## 2023-03-06 LAB — COMPREHENSIVE METABOLIC PANEL
ALT: 21 IU/L (ref 0–32)
AST: 17 IU/L (ref 0–40)
Albumin/Globulin Ratio: 2 (ref 1.2–2.2)
Albumin: 4.5 g/dL (ref 3.9–4.9)
Alkaline Phosphatase: 72 IU/L (ref 44–121)
BUN/Creatinine Ratio: 18 (ref 9–23)
BUN: 13 mg/dL (ref 6–24)
Bilirubin Total: 0.4 mg/dL (ref 0.0–1.2)
CO2: 22 mmol/L (ref 20–29)
Calcium: 9.3 mg/dL (ref 8.7–10.2)
Chloride: 105 mmol/L (ref 96–106)
Creatinine, Ser: 0.71 mg/dL (ref 0.57–1.00)
Globulin, Total: 2.3 g/dL (ref 1.5–4.5)
Glucose: 92 mg/dL (ref 70–99)
Potassium: 4.4 mmol/L (ref 3.5–5.2)
Sodium: 141 mmol/L (ref 134–144)
Total Protein: 6.8 g/dL (ref 6.0–8.5)
eGFR: 110 mL/min/{1.73_m2} (ref >59)

## 2023-03-06 LAB — TSH+FREE T4
Free T4: 1.26 ng/dL (ref 0.82–1.77)
TSH: 1.92 u[IU]/mL (ref 0.450–4.500)

## 2023-03-06 LAB — LIPID PANEL
Chol/HDL Ratio: 3.5 ratio (ref 0.0–4.4)
Cholesterol, Total: 150 mg/dL (ref 100–199)
HDL: 43 mg/dL (ref >39)
LDL Chol Calc (NIH): 88 mg/dL (ref 0–99)
Triglycerides: 101 mg/dL (ref 0–149)
VLDL Cholesterol Cal: 19 mg/dL (ref 5–40)

## 2023-03-06 LAB — CBC WITH DIFFERENTIAL/PLATELET
Basophils Absolute: 0 10*3/uL (ref 0.0–0.2)
Basos: 1 %
EOS (ABSOLUTE): 0.1 10*3/uL (ref 0.0–0.4)
Eos: 1 %
Hematocrit: 38.6 % (ref 34.0–46.6)
Hemoglobin: 12.7 g/dL (ref 11.1–15.9)
Immature Grans (Abs): 0 10*3/uL (ref 0.0–0.1)
Immature Granulocytes: 0 %
Lymphocytes Absolute: 1.2 10*3/uL (ref 0.7–3.1)
Lymphs: 20 %
MCH: 28 pg (ref 26.6–33.0)
MCHC: 32.9 g/dL (ref 31.5–35.7)
MCV: 85 fL (ref 79–97)
Monocytes Absolute: 0.5 10*3/uL (ref 0.1–0.9)
Monocytes: 8 %
Neutrophils Absolute: 4 10*3/uL (ref 1.4–7.0)
Neutrophils: 70 %
Platelets: 244 10*3/uL (ref 150–450)
RBC: 4.53 x10E6/uL (ref 3.77–5.28)
RDW: 13.9 % (ref 11.7–15.4)
WBC: 5.7 10*3/uL (ref 3.4–10.8)

## 2023-03-06 LAB — B12 AND FOLATE PANEL
Folate: 13.8 ng/mL (ref >3.0)
Vitamin B-12: 826 pg/mL (ref 232–1245)

## 2023-03-06 LAB — HEMOGLOBIN A1C
Est. average glucose Bld gHb Est-mCnc: 108 mg/dL
Hgb A1c MFr Bld: 5.4 % (ref 4.8–5.6)

## 2023-06-28 ENCOUNTER — Encounter: Payer: Self-pay | Admitting: Nurse Practitioner

## 2023-07-23 ENCOUNTER — Ambulatory Visit (INDEPENDENT_AMBULATORY_CARE_PROVIDER_SITE_OTHER): Payer: 59 | Admitting: Family Medicine

## 2023-07-23 ENCOUNTER — Encounter: Payer: Self-pay | Admitting: Family Medicine

## 2023-07-23 VITALS — BP 105/71 | HR 67 | Temp 98.5°F | Resp 18 | Ht 66.0 in | Wt 156.0 lb

## 2023-07-23 DIAGNOSIS — Z8709 Personal history of other diseases of the respiratory system: Secondary | ICD-10-CM | POA: Insufficient documentation

## 2023-07-23 DIAGNOSIS — B379 Candidiasis, unspecified: Secondary | ICD-10-CM

## 2023-07-23 DIAGNOSIS — T3695XA Adverse effect of unspecified systemic antibiotic, initial encounter: Secondary | ICD-10-CM | POA: Diagnosis not present

## 2023-07-23 DIAGNOSIS — J029 Acute pharyngitis, unspecified: Secondary | ICD-10-CM | POA: Insufficient documentation

## 2023-07-23 LAB — POCT RAPID STREP A (OFFICE): Rapid Strep A Screen: NEGATIVE

## 2023-07-23 MED ORDER — FLUCONAZOLE 150 MG PO TABS
ORAL_TABLET | ORAL | 0 refills | Status: AC
Start: 2023-07-23 — End: ?

## 2023-07-23 MED ORDER — AMOXICILLIN 500 MG PO TABS
500.0000 mg | ORAL_TABLET | Freq: Two times a day (BID) | ORAL | 0 refills | Status: DC
Start: 2023-07-23 — End: 2023-09-11

## 2023-07-23 NOTE — Assessment & Plan Note (Signed)
Sore throat, fatigue, and headache present for one day. Rapid strep negative in office, likely testing too soon from symptom start. History of significant illness with strep, will treat presumed strep infection. Amoxicillin 500 mg BID for 10 days. Diflucan for antibiotic induced yeast infection, may repeat in 3 days if symptoms persist. Follow-up if symptoms do not resolve or worsen.

## 2023-07-23 NOTE — Progress Notes (Signed)
Established Patient Office Visit  Subjective   Patient ID: Vanessa Larson, female    DOB: 11/29/1980  Age: 42 y.o. MRN: [id number]  Chief Complaint  Patient presents with   Sore Throat    Patient states that her throat began getting sore yesterday she states  that she is prone to getting strep throat    HPI Presents today for an acute visit with complaint of sore throat and feeling "off"  Symptoms have been present  since yesterday.  Associated symptoms include: fatigue and headache Pertinent negatives: no fever or chills.  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  History of negative strep in past and then getting high fever with positive strep.     Review of Systems  Constitutional:  Positive for malaise/fatigue. Negative for fever.  HENT:  Positive for sore throat.   Respiratory:  Negative for cough.   Neurological:  Positive for headaches.      Objective:     BP 105/71   Pulse 67   Temp 98.5 F (36.9 C) (Oral)   Resp 18   Ht 5\' 6"  (1.676 m)   Wt 156 lb (70.8 kg)   SpO2 100%   BMI 25.18 kg/m  BP Readings from Last 3 Encounters:  07/23/23 105/71  03/05/23 108/77  02/16/23 112/72      Physical Exam Vitals and nursing note reviewed.  Constitutional:      General: She is not in acute distress.    Appearance: She is well-developed. She is ill-appearing.  HENT:     Right Ear: Tympanic membrane normal.     Left Ear: Tympanic membrane normal.     Nose: Nose normal.     Mouth/Throat:     Pharynx: Uvula midline. Posterior oropharyngeal erythema present. No oropharyngeal exudate.     Tonsils: No tonsillar exudate. 1+ on the right. 1+ on the left.  Cardiovascular:     Heart sounds: Normal heart sounds.  Pulmonary:     Effort: Pulmonary effort is normal.     Breath sounds: Normal breath sounds.  Skin:    General: Skin is  warm and dry.  Neurological:     General: No focal deficit present.     Mental Status: She is alert. Mental status is at baseline.  Psychiatric:        Mood and Affect: Mood normal.        Behavior: Behavior normal.        Thought Content: Thought content normal.        Judgment: Judgment normal.     Results for orders placed or performed in visit on 07/23/23  POCT rapid strep A  Result Value Ref Range   Rapid Strep A Screen Negative Negative      The 10-year ASCVD risk score (Arnett DK, et al., 2019) is: 0.4%    Assessment & Plan:   Problem List Items Addressed This Visit     Antibiotic-induced yeast infection   Relevant Medications   fluconazole (DIFLUCAN) 150 MG tablet   Sore throat   Relevant Medications   amoxicillin (AMOXIL) 500 MG tablet   Other Relevant Orders   POCT rapid strep A (Completed)   History of streptococcal sore throat - Primary    Sore throat, fatigue, and headache present for one day. Rapid strep negative in office, likely testing too soon from  symptom start. History of significant illness with strep, will treat presumed strep infection. Amoxicillin 500 mg BID for 10 days. Diflucan for antibiotic induced yeast infection, may repeat in 3 days if symptoms persist. Follow-up if symptoms do not resolve or worsen.       Relevant Medications   amoxicillin (AMOXIL) 500 MG tablet  Agrees with plan of care discussed.  Questions answered.   Return if symptoms worsen or fail to improve.    [name], FNP

## 2023-07-30 ENCOUNTER — Ambulatory Visit: Payer: 59 | Admitting: Obstetrics & Gynecology

## 2023-07-30 ENCOUNTER — Other Ambulatory Visit (HOSPITAL_COMMUNITY)
Admission: RE | Admit: 2023-07-30 | Discharge: 2023-07-30 | Disposition: A | Discharge status: Home / Self Care | Payer: 59 | Source: Ambulatory Visit | Attending: Obstetrics & Gynecology | Admitting: Obstetrics & Gynecology

## 2023-07-30 ENCOUNTER — Encounter: Payer: Self-pay | Admitting: Obstetrics & Gynecology

## 2023-07-30 VITALS — BP 137/91 | HR 73 | Resp 16 | Ht 66.0 in | Wt 157.0 lb

## 2023-07-30 DIAGNOSIS — Z30431 Encounter for routine checking of intrauterine contraceptive device: Secondary | ICD-10-CM | POA: Diagnosis not present

## 2023-07-30 DIAGNOSIS — N841 Polyp of cervix uteri: Secondary | ICD-10-CM | POA: Insufficient documentation

## 2023-07-30 NOTE — Progress Notes (Signed)
   Subjective:    Patient ID: Vanessa Larson, female    DOB: 02-May-1981, 42 y.o.   MRN: [id number]  HPI  42 yo female presents for spotting with IUD--sometimes twice a month; Some spotting after intercourse--bright red blood that could stain sheets (requires shower)  Review of Systems  Constitutional: Negative.   Respiratory: Negative.    Cardiovascular: Negative.   Gastrointestinal: Negative.   Genitourinary: Negative.        Objective:   Physical Exam Vitals reviewed.  Constitutional:      General: She is not in acute distress.    Appearance: She is well-developed.  HENT:     Head: Normocephalic and atraumatic.  Eyes:     Conjunctiva/sclera: Conjunctivae normal.  Cardiovascular:     Rate and Rhythm: Normal rate.  Pulmonary:     Effort: Pulmonary effort is normal.  Genitourinary:    Skin:    General: Skin is warm and dry.  Neurological:     Mental Status: She is alert and oriented to person, place, and time.  Psychiatric:        Mood and Affect: Mood normal.    Vitals:   07/30/23 1424  BP: (!) 137/91  Pulse: 73  Resp: 16  Weight: 157 lb (71.2 kg)  Height: 5\' 6"  (1.676 m)      Assessment & Plan:  77 you female with spotting   IUD strings normal length Small endometrial polyp removed today If bleeding continues--can repeat exam or consider pelvic US complete. BP elevated without dx of HTN  Removal of endocervical polyp  Procedure explained in detail and informed consent obtained.  Risks include bleeding and infection.  Small sub centimeter polyp grasped with ring forceps and twisted off.  Specimen handed off to RN.  Hemostasis controlled with silver nitrate.

## 2023-08-01 LAB — SURGICAL PATHOLOGY

## 2023-09-11 ENCOUNTER — Encounter: Payer: Self-pay | Admitting: Nurse Practitioner

## 2023-09-11 ENCOUNTER — Other Ambulatory Visit (INDEPENDENT_AMBULATORY_CARE_PROVIDER_SITE_OTHER): Payer: 59

## 2023-09-11 ENCOUNTER — Ambulatory Visit (INDEPENDENT_AMBULATORY_CARE_PROVIDER_SITE_OTHER): Payer: 59 | Admitting: Nurse Practitioner

## 2023-09-11 ENCOUNTER — Other Ambulatory Visit: Payer: Self-pay | Admitting: Nurse Practitioner

## 2023-09-11 VITALS — BP 114/74 | HR 56 | Ht 66.0 in | Wt 158.1 lb

## 2023-09-11 DIAGNOSIS — K529 Noninfective gastroenteritis and colitis, unspecified: Secondary | ICD-10-CM

## 2023-09-11 DIAGNOSIS — K625 Hemorrhage of anus and rectum: Secondary | ICD-10-CM

## 2023-09-11 LAB — CBC
HCT: 39.5 % (ref 36.0–46.0)
Hemoglobin: 13.2 g/dL (ref 12.0–15.0)
MCHC: 33.3 g/dL (ref 30.0–36.0)
MCV: 88.4 fL (ref 78.0–100.0)
Platelets: 232 10*3/uL (ref 150.0–400.0)
RBC: 4.47 Mil/uL (ref 3.87–5.11)
RDW: 13.4 % (ref 11.5–15.5)
WBC: 5.3 10*3/uL (ref 4.0–10.5)

## 2023-09-11 LAB — BASIC METABOLIC PANEL
BUN: 11 mg/dL (ref 6–23)
CO2: 27 meq/L (ref 19–32)
Calcium: 9.5 mg/dL (ref 8.4–10.5)
Chloride: 105 meq/L (ref 96–112)
Creatinine, Ser: 0.63 mg/dL (ref 0.40–1.20)
GFR: 110.39 mL/min (ref >60.00)
Glucose, Bld: 95 mg/dL (ref 70–99)
Potassium: 3.7 meq/L (ref 3.5–5.1)
Sodium: 140 meq/L (ref 135–145)

## 2023-09-11 LAB — SEDIMENTATION RATE: Sed Rate: 12 mm/h (ref 0–20)

## 2023-09-11 LAB — TSH: TSH: 1.37 u[IU]/mL (ref 0.35–5.50)

## 2023-09-11 LAB — C-REACTIVE PROTEIN: CRP: <1 mg/dL (ref 0.5–20.0)

## 2023-09-11 MED ORDER — CHOLESTYRAMINE 4 G PO PACK: 4.0000 g | PACK | Freq: Every day | ORAL | 1 refills | Status: DC

## 2023-09-11 NOTE — Progress Notes (Signed)
____________________________________________________________  Attending physician addendum:  Thank you for sending this case to me. I have reviewed the entire note and agree with the plan.  I agree with trying a bile acid binding agent as you recommended.  If colonoscopy unrevealing and biopsies negative for microscopic colitis, it seems reasonable to give a trial of antispasmodic.  If not helpful, then empirically treat for possible SIBO.  Amada Jupiter, MD  ____________________________________________________________

## 2023-09-11 NOTE — Progress Notes (Signed)
09/11/2023 Vanessa Larson [id number] 08-22-1981   CHIEF COMPLAINT: Blood in stool  HISTORY OF PRESENT ILLNESS: Vanessa Larson is a 42 year old female with a past medical history of anxiety and depression. Past D & C, C. section and cholecystectomy. She presents to our office today as referred by Dorena Bodo NP for further evaluation loose stools rectal bleeding and to schedule a colonoscopy. She underwent a cholecystectomy 04/2016 and since then she has had chronic loose stools. She describes having up to 5 watery urgent bowel movements in 1 day in the next 1 to 2 days she feels constipated, feels like she needs to go to the bathroom but cannot and the following day she passes small bits of stool with water. She rarely passes any formed stool. She works as a Production assistant, radio and recently awakened with diarrhea every hour for several hours and she took Imodium so she could go to work and her diarrhea decreased.  No recent antibiotics. She intermittently has some generalized abdominal discomfort and bloat no severe abdominal pain. She reported seeing bright red blood with her bowel movements x 2 episodes with associated anorectal discomfort, the last time occurred approximately 1 month ago. She stated being diagnosed with an anal fissure by her GYN 5 years ago. She spends a lot of time on the toilet. She stated her friends informed her that her bowel pattern is not normal. Dairy products and caffeinated sodas worsen her diarrhea.  She drinks 1 cup of coffee daily.  No known family history of celiac disease or IBD but she stated one of her friends has celiac disease and ulcerative colitis.  She also has occasional nausea without vomiting.  No GERD symptoms or dysphagia.  Stress level is elevated, she is in the process of separating from her spouse.       Latest Ref Rng & Units 03/05/2023   10:12 AM 01/20/2022    8:15 AM 03/12/2018   12:20 PM  CBC  WBC 3.4 - 10.8 x10E3/uL 5.7  5.0  12.3   Hemoglobin 11.1 -  15.9 g/dL 44.0  34.7  42.5   Hematocrit 34.0 - 46.6 % 38.6  34.9  37.7   Platelets 150 - 450 x10E3/uL 244  235  191        Latest Ref Rng & Units 03/05/2023   10:12 AM 01/20/2022    8:15 AM 09/13/2015   10:13 AM  CMP  Glucose 70 - 99 mg/dL 92  99  79   BUN 6 - 24 mg/dL 13  15  6    Creatinine 0.57 - 1.00 mg/dL 9.56  3.87  5.64   Sodium 134 - 144 mmol/L 141  140  138   Potassium 3.5 - 5.2 mmol/L 4.4  4.7  3.6   Chloride 96 - 106 mmol/L 105  106  105   CO2 20 - 29 mmol/L 22  28  21    Calcium 8.7 - 10.2 mg/dL 9.3  9.3  8.3   Total Protein 6.0 - 8.5 g/dL 6.8  6.8  6.1   Total Bilirubin 0.0 - 1.2 mg/dL 0.4  0.4  0.4   Alkaline Phos 44 - 121 IU/L 72   79   AST 0 - 40 IU/L 17  14  13    ALT 0 - 32 IU/L 21  15  11       Past Medical History:  Diagnosis Date   Anxiety    Depression    H/O abnormal Pap  smear    Past Surgical History:  Procedure Laterality Date   CESAREAN SECTION  2010   x 1   DILATION AND EVACUATION N/A 12/06/2014   Procedure: DILATATION AND EVACUATION;  Surgeon: Reva Bores, MD;  Location: [facility];  Service: Gynecology;  Laterality: N/A;   WISDOM TOOTH EXTRACTION     Social History: She is married, in the process of separating from her spouse.  She has 5 sons and 1 daughter.  She works as a Production assistant, radio.  Non-smoker.  Infrequent alcohol intake.  No drug use.  Family History: Mother with depression.  Maternal grandmother with history of a stroke.  No known family history of esophageal, gastric or colorectal cancer.   reports that she has never smoked. She has never used smokeless tobacco. She reports that she does not drink alcohol and does not use drugs. family history includes Cancer in her maternal grandfather; Depression in her mother; Stroke in her maternal grandmother. Allergies  Allergen Reactions   Latex Itching    Patient states latex condoms cause burning and itching Patient states latex condoms cause burning and itching Patient states latex condoms cause burning  and itching      Outpatient Encounter Medications as of 09/11/2023  Medication Sig   amoxicillin (AMOXIL) 500 MG tablet Take 1 tablet (500 mg total) by mouth 2 (two) times daily.   levonorgestrel (MIRENA) 20 MCG/DAY IUD 1 each by Intrauterine route once.   valACYclovir (VALTREX) 1000 MG tablet Take 1,000 mg by mouth daily.   No facility-administered encounter medications on file as of 09/11/2023.   REVIEW OF SYSTEMS:  Gen: Denies fever, sweats or chills. No weight loss.  CV: Denies chest pain, palpitations or edema. Resp: Denies cough, shortness of breath of hemoptysis.  GI: See HPI.  GU: Denies urinary burning, blood in urine, increased urinary frequency or incontinence. MS: Denies joint pain, muscles aches or weakness. Derm: Denies rash, itchiness, skin lesions or unhealing ulcers. Psych: + Anxiety and depression. Heme: Denies bruising, easy bleeding. Neuro:  Denies headaches, dizziness or paresthesias. Endo:  Denies any problems with DM, thyroid or adrenal function.  PHYSICAL EXAM: BP 114/74   Pulse (!) 56   Ht 5\' 6"  (1.676 m)   Wt 158 lb 2 oz (71.7 kg)   BMI 25.52 kg/m   General: 42 year old female in no acute distress. Head: Normocephalic and atraumatic. Eyes:  Sclerae non-icteric, conjunctive pink. Ears: Normal auditory acuity. Mouth: Dentition intact. No ulcers or lesions.  Neck: Supple, no lymphadenopathy or thyromegaly.  Lungs: Clear bilaterally to auscultation without wheezes, crackles or rhonchi. Heart: Regular rate and rhythm. No murmur, rub or gallop appreciated.  Abdomen: Soft, nontender, nondistended. No masses. No hepatosplenomegaly. Normoactive bowel sounds x 4 quadrants.  Rectal: No external hemorrhoids or fissures.  Posterior anal area felt boggy without palpable fissure/abscess with very slight tenderness. Small internal hemorrhoids without prolapse.  No blood or stool in the rectal vault. Demita CMA present during exam.  Musculoskeletal: Symmetrical  with no gross deformities. Skin: Warm and dry. No rash or lesions on visible extremities. Extremities: No edema. Neurological: Alert oriented x 4, no focal deficits.  Psychological:  Alert and cooperative. Normal mood and affect.  ASSESSMENT AND PLAN:  42 year old female with chronic urgent diarrhea which started status post cholecystectomy in 2017, intermittent generalized abdominal discomfort/bloat without severe abdominal pain. -CBC, BMP, TSH, TTG, IgA, sed rate and CRP -Diagnostic colonoscopy benefits and risks discussed including risk with sedation, risk of bleeding, perforation and infection  -  Patient will contact our office when she is ready to schedule colonoscopy -Trial with Cholestyramine 4 g packet mixed in 4 ounces of clear liquid of choice once daily not to be taken within 4 hours of any other medication and stop if no bowel movement 24 hours -Follow-up in office in 2 months  History of an anal fissure.  Intermittent anorectal discomfort with bright red blood with bowel movements x 2 episodes, last occurred 1 month ago.  No evidence of anal fissure on exam today. -See plan above     CC:  [name], FNP

## 2023-09-11 NOTE — Patient Instructions (Addendum)
It has been recommended to you by your physician that you have a(n) Colonoscopy completed. Per your request, we did not schedule the procedure(s) today. Please contact our office at [phone] should you decide to have the procedure completed. You will be scheduled for a pre-visit and procedure at that time.  Your provider has requested that you go to the basement level for lab work before leaving today. Press "B" on the elevator. The lab is located at the first door on the left as you exit the elevator.  Due to recent changes in healthcare laws, you may see the results of your imaging and laboratory studies on MyChart before your provider has had a chance to review them.  We understand that in some cases there may be results that are confusing or concerning to you. Not all laboratory results come back in the same time frame and the provider may be waiting for multiple results in order to interpret others.  Please give [place] 48 hours in order for your provider to thoroughly review all the results before contacting the office for clarification of your results.   Thank you for trusting me with your gastrointestinal care!   Alcide Evener, CRNP

## 2023-09-13 LAB — IGA: Immunoglobulin A: 213 mg/dL (ref 47–310)

## 2023-09-13 LAB — TISSUE TRANSGLUTAMINASE ABS,IGG,IGA
(tTG) Ab, IgA: <1 U/mL
(tTG) Ab, IgG: <1 U/mL

## 2023-09-25 ENCOUNTER — Ambulatory Visit: Payer: 59 | Admitting: Obstetrics and Gynecology

## 2023-10-03 NOTE — Progress Notes (Unsigned)
GYNECOLOGY OFFICE VISIT NOTE  History:   Vanessa Larson is a 42 y.o. [id number] here today for removal of IUD.   She is also having abnormal vaginal discharge.   She reports hair loss.   She would like something else for birth control.    She had her Mirena placed 08/2022.    The following portions of the patient's history were reviewed and updated as appropriate: allergies, current medications, past family history, past medical history, past social history, past surgical history and problem list.   Health Maintenance:   Diagnosis  Date Value Ref Range Status  12/21/2020   Final   - Negative for intraepithelial lesion or malignancy (NILM)   Review of Systems:  Pertinent items noted in HPI and remainder of comprehensive ROS otherwise negative.  Physical Exam:  There were no vitals taken for this visit. CONSTITUTIONAL: Well-developed, well-nourished female in no acute distress.  HEENT:  Normocephalic, atraumatic. External right and left ear normal. No scleral icterus.  NECK: Normal range of motion, supple, no masses noted on observation SKIN: No rash noted. Not diaphoretic. No erythema. No pallor. MUSCULOSKELETAL: Normal range of motion. No edema noted. NEUROLOGIC: Alert and oriented to person, place, and time. Normal muscle tone coordination. No cranial nerve deficit noted. PSYCHIATRIC: Normal mood and affect. Normal behavior. Normal judgment and thought content.  PELVIC: {Blank single:19197::"Deferred","Normal appearing external genitalia; normal urethral meatus; normal appearing vaginal mucosa and cervix.  No abnormal discharge noted.  Normal uterine size, no other palpable masses, no uterine or adnexal tenderness. Performed in the presence of a chaperone"}    GYNECOLOGY OFFICE PROCEDURE NOTE  IUD Removal  Patient identified, informed consent performed, consent signed.  Patient was in the dorsal lithotomy position, normal external genitalia was noted.  A speculum was  placed in the patient's vagina, normal discharge was noted, no lesions. The cervix was visualized, no lesions, no abnormal discharge.  The strings of the IUD were grasped and pulled using ring forceps. {Blank single:19197::"The IUD was removed in its entirety. ","The strings of the IUD were not visualized, so Kelly forceps were introduced into the endometrial cavity and the IUD was grasped and removed in its entirety. ","The IUD was unable to be removed"}  Patient tolerated the procedure well.    Patient {Blank single:19197::"will use *** for contraception.","plans for pregnancy soon and she was told to avoid teratogens, take PNV and folic acid."}  Routine preventative health maintenance measures emphasized.   Milas Hock, MD, FACOG Obstetrician & Gynecologist, [facility] for [facility], [facility]  Assessment and Plan:   1. Encounter for IUD removal IUD removed per pt request following consent signing.   2. Birth control counseling - Reviewed different types of birth control available: OCPs, vaginal ring, Nexplanon, Depo, various types of IUDs, permanent sterilization.  We reviewed the advantages and risks of each (particularly risk of VTE with estrogen containing options). We discussed side effects of each. - Reviewed that birth control does not protect against STI. Condoms reduce the risk of transmission but are not 100% effective especially for HSV and HIV. - Patient has tried: {Birth control type:23956} - Patient desires: {Birth control type:23956}   3. Vaginal discharge Check swab  4. Hair loss If persists despite IUD removal, recommend f/u with derm.     Diagnoses and all orders for this visit:  Encounter for IUD removal  Birth control counseling  Vaginal discharge  Hair loss     No orders of the  defined types were placed in this encounter.    Routine preventative health maintenance measures emphasized. Please refer to After  Visit Summary for other counseling recommendations.   No follow-ups on file.  Milas Hock, MD, FACOG Obstetrician & Gynecologist, [facility] for [facility], [facility]

## 2023-10-04 ENCOUNTER — Ambulatory Visit: Payer: 59 | Admitting: Obstetrics and Gynecology

## 2023-10-04 ENCOUNTER — Telehealth: Payer: Self-pay | Admitting: *Deleted

## 2023-10-04 DIAGNOSIS — N898 Other specified noninflammatory disorders of vagina: Secondary | ICD-10-CM

## 2023-10-04 DIAGNOSIS — L659 Nonscarring hair loss, unspecified: Secondary | ICD-10-CM

## 2023-10-04 DIAGNOSIS — Z30432 Encounter for removal of intrauterine contraceptive device: Secondary | ICD-10-CM

## 2023-10-04 DIAGNOSIS — Z3009 Encounter for other general counseling and advice on contraception: Secondary | ICD-10-CM

## 2023-10-04 NOTE — Telephone Encounter (Signed)
Patient left a message at 6:37 AM to reschedule appt. Left patient a message that appointment will be canceled and to call back to reschedule when she is able.

## 2023-10-15 ENCOUNTER — Encounter (HOSPITAL_COMMUNITY): Payer: Self-pay

## 2023-10-15 ENCOUNTER — Other Ambulatory Visit: Payer: Self-pay

## 2023-10-15 ENCOUNTER — Emergency Department (HOSPITAL_COMMUNITY)
Admission: EM | Admit: 2023-10-15 | Discharge: 2023-10-15 | Disposition: A | Discharge status: Home / Self Care | Payer: 59 | Attending: Student | Admitting: Student

## 2023-10-15 DIAGNOSIS — Z91040 Latex allergy status: Secondary | ICD-10-CM | POA: Diagnosis not present

## 2023-10-15 DIAGNOSIS — J029 Acute pharyngitis, unspecified: Secondary | ICD-10-CM | POA: Diagnosis present

## 2023-10-15 LAB — GROUP A STREP BY PCR: Group A Strep by PCR: NOT DETECTED

## 2023-10-15 LAB — MONONUCLEOSIS SCREEN: Mono Screen: NEGATIVE

## 2023-10-15 MED ORDER — METHYLPREDNISOLONE 4 MG PO TBPK: ORAL_TABLET | ORAL | 0 refills | Status: DC

## 2023-10-15 MED ORDER — ONDANSETRON 4 MG PO TBDP
ORAL_TABLET | ORAL | Status: AC
  Filled 2023-10-15: qty 1

## 2023-10-15 MED ORDER — AMOXICILLIN-POT CLAVULANATE 400-57 MG/5ML PO SUSR: 875.0000 mg | Freq: Two times a day (BID) | ORAL | 0 refills | Status: AC

## 2023-10-15 MED ORDER — NAPROXEN 375 MG PO TABS
375.0000 mg | ORAL_TABLET | Freq: Two times a day (BID) | ORAL | 0 refills | Status: DC
Start: 2023-10-15 — End: 2024-01-31

## 2023-10-15 MED ORDER — ONDANSETRON 4 MG PO TBDP
4.0000 mg | ORAL_TABLET | Freq: Once | ORAL | Status: AC
  Administered 2023-10-15: 4 mg via ORAL

## 2023-10-15 MED ORDER — DEXAMETHASONE SODIUM PHOSPHATE 10 MG/ML IJ SOLN
10.0000 mg | Freq: Once | INTRAMUSCULAR | Status: AC
  Administered 2023-10-15: 10 mg via INTRAMUSCULAR
  Filled 2023-10-15: qty 1

## 2023-10-15 MED ORDER — KETOROLAC TROMETHAMINE 15 MG/ML IJ SOLN
15.0000 mg | Freq: Once | INTRAMUSCULAR | Status: AC
  Administered 2023-10-15: 15 mg via INTRAMUSCULAR
  Filled 2023-10-15: qty 1

## 2023-10-15 NOTE — ED Triage Notes (Signed)
Pt arrives via POV. Pt reports sore throat for the past 4 days. Pt was seen at [facility] and treated for strep (tested negative). Pt states she has been taking amoxicillin with no relief. States the pain has made it extremely hard for her to eat. Pt is AxOx4.

## 2023-10-15 NOTE — ED Provider Triage Note (Signed)
Emergency Medicine Provider Triage Evaluation Note  Vanessa Larson , a 42 y.o. female  was evaluated in triage.  Pt complains of sore throat. States that same began 4 days ago and has been persistent and worsening. She went to urgent care and had negative strep swab. Treated empirically with Amoxicillin, on day 3 of this. States that she is having difficulty swallowing and presents for same. She is able to tolerated her secretions with discomfort.  Review of Systems  Positive:  Negative:   Physical Exam  BP 129/82 (BP Location: Left Arm)   Pulse (!) 112   Temp 100.2 F (37.9 C) (Oral)   Resp 18   Ht 5\' 6"  (1.676 m)   Wt 68 kg   SpO2 100%   BMI 24.21 kg/m  Gen:   Awake, no distress   Resp:  Normal effort  MSK:   Moves extremities without difficulty  Other:  Tonsillar swelling bilaterally, no exudate. Uvula midline. No PTA or RPA visualized.  Medical Decision Making  Medically screening exam initiated at 2:16 PM.  Appropriate orders placed.  Vanessa Larson was informed that the remainder of the evaluation will be completed by another provider, this initial triage assessment does not replace that evaluation, and the importance of remaining in the ED until their evaluation is complete.  Work-up initiated   Silva Bandy, PA-C 10/15/23 1418

## 2023-10-16 NOTE — ED Provider Notes (Signed)
[place] EMERGENCY DEPARTMENT AT [facility] Provider Note  CSN: [id number] Arrival date & time: 10/15/23 1350  Chief Complaint(s) Sore Throat  HPI Vanessa Larson is a 42 y.o. female with PMH anxiety, depression, frequent strep throat infections who presents emergency room for evaluation of sore throat.  Patient states that over the last 4 days she has had a progressive worsening of the sore throat.  Was seen in urgent care and tested negative for COVID, flu, RSV and strep.  Has been taking amoxicillin at home without relief.  Has been having difficulty tolerating p.o. due to severe sore throat.  Received Decadron and Toradol in the ER lobby prior to my evaluation and she currently states that symptoms are significantly improved.  She has full range of motion of the neck and no trismus.  Denies chest pain, shortness of breath, abdominal pain, nausea, vomiting or other systemic symptoms.   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 Authorizing Provider  amoxicillin-clavulanate (AUGMENTIN) 400-57 MG/5ML suspension Take 10.9 mLs (875 mg total) by mouth 2 (two) times daily for 5 days. 10/15/23 10/20/23 Yes Neyda Durango, MD  methylPREDNISolone (MEDROL DOSEPAK)  4 MG TBPK tablet Take as prescribed 10/15/23  Yes Akif Weldy, MD  naproxen (NAPROSYN) 375 MG tablet Take 1 tablet (375 mg total) by mouth 2 (two) times daily. 10/15/23  Yes Shamicka Inga, MD  cholestyramine (QUESTRAN) 4 g packet Take 1 packet (4 g total) by mouth at bedtime. Mix in 4 ounces of a clear liquid of your choice. Do not take within 4 hours of any other medications. 09/11/23   Arnaldo Natal, NP  levonorgestrel (MIRENA) 20 MCG/DAY IUD 1 each by Intrauterine route once.    [provider]  valACYclovir (VALTREX) 1000 MG tablet Take 1,000 mg by mouth daily. 08/17/22   [provider]                                                                                                                                    Past Surgical History Past Surgical History:  Procedure Laterality Date   CESAREAN SECTION  10/30/2008   x 1   Cholcystectomy  N/A    04/2016   DILATION AND EVACUATION N/A 12/06/2014   Procedure: DILATATION AND EVACUATION;  Surgeon: Reva Bores, MD;  Location: [facility];  Service: Gynecology;  Laterality: N/A;   WISDOM TOOTH EXTRACTION     Family History Family History  Problem Relation Age of Onset   Depression Mother    Stroke Maternal Grandmother    Cancer Maternal Grandfather     Social History Social History   Tobacco Use   Smoking status: Never   Smokeless tobacco: Never  Vaping Use   Vaping status: Never Used  Substance Use Topics   Alcohol use: Yes    Comment: 1-2 drinks on the weekend   Drug use: No    Comment: Marijuana three times in college   Allergies Latex  Review of Systems Review of Systems  HENT:  Positive for sore throat.     Physical Exam Vital Signs  I have reviewed the triage vital signs BP 117/71   Pulse 96   Temp 98.7 F (37.1 C) (Oral)   Resp 17   Ht 5\' 6"  (1.676 m)   Wt 68 kg   SpO2 99%   BMI 24.21 kg/m   Physical Exam Vitals and nursing note reviewed.  Constitutional:       General: She is not in acute distress.    Appearance: She is well-developed.  HENT:     Head: Normocephalic and atraumatic.     Mouth/Throat:     Tonsils: Tonsillar exudate present. 1+ on the right. 1+ on the left.  Eyes:     Conjunctiva/sclera: Conjunctivae normal.  Pulmonary:     Effort: Pulmonary effort is normal. No respiratory distress.  Musculoskeletal:        General: No swelling.     Cervical back: Neck supple.  Skin:    General: Skin is warm and dry.     Capillary Refill: Capillary refill takes less than 2 seconds.  Neurological:     Mental Status: She is alert.  Psychiatric:        Mood and Affect: Mood normal.     ED Results and Treatments Labs (all labs ordered are listed, but only abnormal results are displayed) Labs Reviewed  GROUP A STREP BY PCR  MONONUCLEOSIS SCREEN                                                                                                                          Radiology No results found.  Pertinent labs & imaging results that were available during my care of the patient were reviewed by me and considered in my medical decision making (see MDM for details).  Medications Ordered in ED Medications  dexamethasone (DECADRON) injection 10 mg (10 mg Intramuscular Given 10/15/23 1423)  ketorolac (TORADOL) 15 MG/ML injection 15 mg (15 mg Intramuscular Given 10/15/23 1425)  ondansetron (ZOFRAN-ODT) disintegrating tablet 4 mg (4 mg Oral Given 10/15/23 1431)  Procedures Procedures  (including critical care time)  Medical Decision Making / ED Course   This patient presents to the ED for concern of sore throat, this involves an extensive number of treatment options, and is a complaint that carries with it a high risk of complications and morbidity.  The differential diagnosis includes PTA, Retropharyngeal  abscess, Ludwig's Angina, Epiglottitis, Bacterial/Viral pharyngitis, Strep Throat, Mononucleosis, diptheria, acute HIV infection, Oral Candidiasis,  MDM: Patient seen emergency room for evaluation of a sore throat.  Physical exam with bilateral tonsillar swelling and minimal exudate but is otherwise unremarkable.  No palpable tender masses in the neck and patient has no evidence of trismus.  No uvular deviation.  Monospot obtained which was negative.  Will expand antibiotics to liquid Augmentin and patient was started on a Medrol Dosepak.  We discussed CT imaging today and given no uvular deviation, lower suspicion for PTA and thus we will defer.  However, if symptoms were to worsen she states she will return to the emergency department and likely will need a CT maxillofacial.  Return precautions given of which she voiced understanding.  At this time she does not meet inpatient criteria for admission and will be discharged with outpatient follow-up.  Outpatient ENT referral placed as patient appears to get frequent strep infections and may be a candidate for tonsillectomy.   Additional history obtained:  -External records from outside source obtained and reviewed including: Chart review including previous notes, labs, imaging, consultation notes   Lab Tests: -I ordered, reviewed, and interpreted labs.   The pertinent results include:   Labs Reviewed  GROUP A STREP BY PCR  MONONUCLEOSIS SCREEN      Medicines ordered and prescription drug management: Meds ordered this encounter  Medications   dexamethasone (DECADRON) injection 10 mg   ketorolac (TORADOL) 15 MG/ML injection 15 mg   ondansetron (ZOFRAN-ODT) disintegrating tablet 4 mg   DISCONTD: ondansetron (ZOFRAN-ODT) 4 MG disintegrating tablet    Keenan Bachelor D: cabinet override   methylPREDNISolone (MEDROL DOSEPAK) 4 MG TBPK tablet    Sig: Take as prescribed    Dispense:  1 each    Refill:  0   amoxicillin-clavulanate (AUGMENTIN)  400-57 MG/5ML suspension    Sig: Take 10.9 mLs (875 mg total) by mouth 2 (two) times daily for 5 days.    Dispense:  100 mL    Refill:  0   naproxen (NAPROSYN) 375 MG tablet    Sig: Take 1 tablet (375 mg total) by mouth 2 (two) times daily.    Dispense:  20 tablet    Refill:  0    -I have reviewed the patients home medicines and have made adjustments as needed  Critical interventions none    Social Determinants of Health:  Factors impacting patients care include: none   Reevaluation: After the interventions noted above, I reevaluated the patient and found that they have :improved  Co morbidities that complicate the patient evaluation  Past Medical History:  Diagnosis Date   Anxiety    Depression    H/O abnormal Pap smear       Dispostion: I considered admission for this patient, but at this time she does not meet inpatient criteria for admission and will be discharged with outpatient follow-up     Final Clinical Impression(s) / ED Diagnoses Final diagnoses:  Sore throat  Pharyngitis, unspecified etiology     @PCDICTATION @    Glendora Score, MD 10/16/23 1142

## 2023-11-22 ENCOUNTER — Ambulatory Visit: Payer: 59 | Admitting: Gastroenterology

## 2024-01-30 NOTE — Progress Notes (Unsigned)
 GYNECOLOGY OFFICE VISIT NOTE  History:   Vanessa Larson is a 43 y.o. [phone] here today for BTB with IUD. Previously had endocervical polyp that was removed in September 2024 with Dr. Penne Lash.    Since that time she has continued to have near daily bleeding of some kind. More recently she also has developed a vaginal odor.   She is currently with one partner who has had a vasectomy.   She has the IUD and was hesitant to get it because she prefers not to do anything hormonal. She did OCPs in the past when she was in college and they affected her mood.   She normally has heavy periods off birth control.   By the end of the conversation, she requested removal of the IUD.      Past Medical History:  Diagnosis Date   Anxiety    Depression    H/O abnormal Pap smear     Past Surgical History:  Procedure Laterality Date   CESAREAN SECTION  10/30/2008   x 1   Cholcystectomy N/A    04/2016   DILATION AND EVACUATION N/A 12/06/2014   Procedure: DILATATION AND EVACUATION;  Surgeon: Reva Bores, MD;  Location: [facility];  Service: Gynecology;  Laterality: N/A;   WISDOM TOOTH EXTRACTION      The following portions of the patient's history were reviewed and updated as appropriate: allergies, current medications, past family history, past medical history, past social history, past surgical history and problem list.   Health Maintenance:   Normal pap and negative HRHPV:  Diagnosis  Date Value Ref Range Status  12/21/2020   Final   - Negative for intraepithelial lesion or malignancy (NILM)     Review of Systems:  Pertinent items noted in HPI and remainder of comprehensive ROS otherwise negative.  Physical Exam:  BP (!) 143/96   Pulse 69   Ht 5\' 6"  (1.676 m)   Wt 152 lb (68.9 kg)   BMI 24.53 kg/m  CONSTITUTIONAL: Well-developed, well-nourished female in no acute distress.  HEENT:  Normocephalic, atraumatic. External right and left ear normal. No scleral icterus.  NECK: Normal  range of motion, supple, no masses noted on observation SKIN: No rash noted. Not diaphoretic. No erythema. No pallor. MUSCULOSKELETAL: Normal range of motion. No edema noted. NEUROLOGIC: Alert and oriented to person, place, and time. Normal muscle tone coordination. No cranial nerve deficit noted. PSYCHIATRIC: Normal mood and affect. Normal behavior. Normal judgment and thought content.  PELVIC: Normal appearing external genitalia; normal urethral meatus; normal appearing vaginal mucosa and cervix.  No abnormal discharge noted.  Normal uterine size, no other palpable masses, no uterine or adnexal tenderness. Performed in the presence of a chaperone   GYNECOLOGY OFFICE PROCEDURE NOTE  IUD Removal  Patient identified, informed verbal consent given with chaperone present (Deanna).  Patient was in the dorsal lithotomy position, normal external genitalia was noted.  A speculum was placed in the patient's vagina, normal discharge was noted, no lesions. The cervix was visualized, no lesions, no abnormal discharge.  The strings of the IUD were grasped and pulled using ring forceps. The IUD was removed in its entirety.   Patient tolerated the procedure well.    Assessment and Plan:   1. Breakthrough bleeding with IUD (Primary) - IUD removed per patient request.  - Vaginal cultures checked.  - Discussed other birth control options including permanent sterilization (salpingectomy). We discussed the risks of surgery and the limits following surgery from anesthesia.  Reviewed no physical limits from the surgery itself. Discussed typical wait time for surgery is about 2-3 months.  - We discussed if needed, she can start Nuvaring for birth control. Otherwise, she can try TXA for her heavy periods. If it helps her heavy periods, she may also consider doing Paragard as an option for birth control if she wishes to avoid surgery.    2. Vaginal odor -     Cervicovaginal ancillary only( )     Meds  ordered this encounter  Medications   tranexamic acid (LYSTEDA) 650 MG TABS tablet    Sig: Take 2 tablets (1,300 mg total) by mouth 3 (three) times daily. Take during menses for a maximum of five days    Dispense:  30 tablet    Refill:  6   etonogestrel-ethinyl estradiol (NUVARING) 0.12-0.015 MG/24HR vaginal ring    Sig: Insert vaginally and leave in place for 3 consecutive weeks, then remove for 1 week.    Dispense:  1 each    Refill:  12     Routine preventative health maintenance measures emphasized. Please refer to After Visit Summary for other counseling recommendations.   Return if symptoms worsen or fail to improve.  Milas Hock, MD, FACOG Obstetrician & Gynecologist, [facility], [facility]

## 2024-01-31 ENCOUNTER — Ambulatory Visit (INDEPENDENT_AMBULATORY_CARE_PROVIDER_SITE_OTHER): Admitting: Obstetrics and Gynecology

## 2024-01-31 ENCOUNTER — Encounter: Payer: Self-pay | Admitting: Obstetrics and Gynecology

## 2024-01-31 ENCOUNTER — Other Ambulatory Visit (HOSPITAL_COMMUNITY)
Admission: RE | Admit: 2024-01-31 | Discharge: 2024-01-31 | Disposition: A | Discharge status: Home / Self Care | Source: Ambulatory Visit | Attending: Obstetrics and Gynecology | Admitting: Obstetrics and Gynecology

## 2024-01-31 VITALS — BP 143/96 | HR 69 | Ht 66.0 in | Wt 152.0 lb

## 2024-01-31 DIAGNOSIS — N898 Other specified noninflammatory disorders of vagina: Secondary | ICD-10-CM | POA: Diagnosis not present

## 2024-01-31 DIAGNOSIS — N760 Acute vaginitis: Secondary | ICD-10-CM | POA: Diagnosis not present

## 2024-01-31 DIAGNOSIS — Z30432 Encounter for removal of intrauterine contraceptive device: Secondary | ICD-10-CM

## 2024-01-31 DIAGNOSIS — N921 Excessive and frequent menstruation with irregular cycle: Secondary | ICD-10-CM | POA: Diagnosis not present

## 2024-01-31 DIAGNOSIS — B9689 Other specified bacterial agents as the cause of diseases classified elsewhere: Secondary | ICD-10-CM

## 2024-01-31 MED ORDER — ETONOGESTREL-ETHINYL ESTRADIOL 0.12-0.015 MG/24HR VA RING: VAGINAL_RING | VAGINAL | 12 refills | Status: AC

## 2024-01-31 MED ORDER — TRANEXAMIC ACID 650 MG PO TABS: 1300.0000 mg | ORAL_TABLET | Freq: Three times a day (TID) | ORAL | 6 refills | Status: AC

## 2024-02-01 ENCOUNTER — Encounter: Payer: Self-pay | Admitting: Obstetrics and Gynecology

## 2024-02-01 LAB — CERVICOVAGINAL ANCILLARY ONLY
Bacterial Vaginitis (gardnerella): POSITIVE — AB
Comment: NEGATIVE

## 2024-02-01 MED ORDER — METRONIDAZOLE 500 MG PO TABS
500.0000 mg | ORAL_TABLET | Freq: Two times a day (BID) | ORAL | 0 refills | Status: AC
Start: 2024-02-01 — End: 2024-02-08

## 2024-02-01 NOTE — Addendum Note (Signed)
 Addended by: Milas Hock A on: 02/01/2024 12:35 PM   Modules accepted: Orders

## 2024-02-04 ENCOUNTER — Other Ambulatory Visit: Payer: Self-pay | Admitting: Obstetrics & Gynecology

## 2024-02-04 ENCOUNTER — Encounter: Payer: Self-pay | Admitting: Obstetrics & Gynecology

## 2024-02-04 DIAGNOSIS — B9689 Other specified bacterial agents as the cause of diseases classified elsewhere: Secondary | ICD-10-CM

## 2024-02-18 ENCOUNTER — Telehealth: Payer: Self-pay | Admitting: *Deleted

## 2024-02-18 NOTE — Telephone Encounter (Signed)
 Returned call from 02/16/2024, office closed on Saturdays. Patient wants to speak with a RN for next steps of her bleeding before she schedules an appointment.

## 2024-02-18 NOTE — Telephone Encounter (Signed)
 Returned call from 2:52 PM. Left patient a message to read MyChart message for next steps.

## 2024-02-29 ENCOUNTER — Telehealth: Payer: Self-pay | Admitting: *Deleted

## 2024-02-29 NOTE — Telephone Encounter (Signed)
 Pt left VM regarding still having bleeding after IUD removal and questioning regarding time frame of healing.  Tried call pt back, but no answer. LVM

## 2024-11-28 ENCOUNTER — Ambulatory Visit: Admitting: Certified Nurse Midwife

## 2024-12-19 ENCOUNTER — Ambulatory Visit: Admitting: Certified Nurse Midwife
